# Patient Record
Sex: Male | Born: 1945 | Race: White | Hispanic: No | Marital: Married | Smoking: Current some day smoker
Health system: Southern US, Community
[De-identification: ages and names within clinical notes are randomized; demographics above are authoritative.]

## PROBLEM LIST (undated history)

## (undated) DIAGNOSIS — R519 Headache, unspecified: Secondary | ICD-10-CM

## (undated) DIAGNOSIS — N3281 Overactive bladder: Secondary | ICD-10-CM

## (undated) DIAGNOSIS — F419 Anxiety disorder, unspecified: Secondary | ICD-10-CM

## (undated) DIAGNOSIS — K8020 Calculus of gallbladder without cholecystitis without obstruction: Secondary | ICD-10-CM

## (undated) DIAGNOSIS — J189 Pneumonia, unspecified organism: Secondary | ICD-10-CM

## (undated) DIAGNOSIS — IMO0001 Reserved for inherently not codable concepts without codable children: Secondary | ICD-10-CM

## (undated) DIAGNOSIS — I10 Essential (primary) hypertension: Secondary | ICD-10-CM

## (undated) DIAGNOSIS — G4733 Obstructive sleep apnea (adult) (pediatric): Secondary | ICD-10-CM

## (undated) DIAGNOSIS — T8859XA Other complications of anesthesia, initial encounter: Secondary | ICD-10-CM

## (undated) DIAGNOSIS — F32A Depression, unspecified: Secondary | ICD-10-CM

## (undated) DIAGNOSIS — M1990 Unspecified osteoarthritis, unspecified site: Secondary | ICD-10-CM

## (undated) DIAGNOSIS — E782 Mixed hyperlipidemia: Secondary | ICD-10-CM

## (undated) DIAGNOSIS — N289 Disorder of kidney and ureter, unspecified: Secondary | ICD-10-CM

## (undated) DIAGNOSIS — F329 Major depressive disorder, single episode, unspecified: Secondary | ICD-10-CM

## (undated) HISTORY — DX: Obstructive sleep apnea (adult) (pediatric): G47.33

## (undated) HISTORY — DX: Disorder of kidney and ureter, unspecified: N28.9

## (undated) HISTORY — DX: Overactive bladder: N32.81

## (undated) HISTORY — PX: HAND SURGERY: SHX662

## (undated) HISTORY — DX: Anxiety disorder, unspecified: F41.9

## (undated) HISTORY — DX: Essential (primary) hypertension: I10

## (undated) HISTORY — DX: Calculus of gallbladder without cholecystitis without obstruction: K80.20

## (undated) HISTORY — DX: Depression, unspecified: F32.A

## (undated) HISTORY — DX: Major depressive disorder, single episode, unspecified: F32.9

## (undated) HISTORY — PX: NOSE SURGERY: SHX723

## (undated) HISTORY — DX: Reserved for inherently not codable concepts without codable children: IMO0001

## (undated) HISTORY — PX: HERNIA REPAIR: SHX51

## (undated) HISTORY — DX: Mixed hyperlipidemia: E78.2

---
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
## 1968-07-25 DIAGNOSIS — H9190 Unspecified hearing loss, unspecified ear: Secondary | ICD-10-CM | POA: Insufficient documentation

## 1983-07-26 DIAGNOSIS — K3189 Other diseases of stomach and duodenum: Secondary | ICD-10-CM | POA: Insufficient documentation

## 1991-07-26 DIAGNOSIS — K5730 Diverticulosis of large intestine without perforation or abscess without bleeding: Secondary | ICD-10-CM | POA: Insufficient documentation

## 2000-07-25 DIAGNOSIS — Q619 Cystic kidney disease, unspecified: Secondary | ICD-10-CM | POA: Insufficient documentation

## 2004-08-23 ENCOUNTER — Ambulatory Visit: Payer: Self-pay | Admitting: Family Medicine

## 2005-07-11 ENCOUNTER — Ambulatory Visit: Payer: Self-pay | Admitting: Gastroenterology

## 2006-01-02 ENCOUNTER — Other Ambulatory Visit: Payer: Self-pay

## 2006-01-02 ENCOUNTER — Inpatient Hospital Stay: Payer: Self-pay | Admitting: Internal Medicine

## 2006-02-20 ENCOUNTER — Ambulatory Visit: Payer: Self-pay | Admitting: Cardiology

## 2006-05-03 ENCOUNTER — Ambulatory Visit: Payer: Self-pay | Admitting: Internal Medicine

## 2008-08-29 ENCOUNTER — Encounter: Admission: RE | Admit: 2008-08-29 | Discharge: 2008-08-29 | Payer: Self-pay | Admitting: Sports Medicine

## 2009-01-07 ENCOUNTER — Ambulatory Visit: Payer: Self-pay | Admitting: Gastroenterology

## 2010-05-18 ENCOUNTER — Ambulatory Visit: Payer: Self-pay

## 2010-06-28 ENCOUNTER — Ambulatory Visit: Payer: Self-pay | Admitting: Unknown Physician Specialty

## 2010-07-08 ENCOUNTER — Inpatient Hospital Stay: Payer: Self-pay | Admitting: Unknown Physician Specialty

## 2011-01-21 ENCOUNTER — Ambulatory Visit: Payer: Self-pay

## 2011-10-06 DIAGNOSIS — R5381 Other malaise: Secondary | ICD-10-CM | POA: Diagnosis not present

## 2011-10-06 DIAGNOSIS — R0602 Shortness of breath: Secondary | ICD-10-CM | POA: Diagnosis not present

## 2011-10-06 DIAGNOSIS — I739 Peripheral vascular disease, unspecified: Secondary | ICD-10-CM | POA: Diagnosis not present

## 2011-10-06 DIAGNOSIS — R5383 Other fatigue: Secondary | ICD-10-CM | POA: Diagnosis not present

## 2011-10-06 DIAGNOSIS — Q762 Congenital spondylolisthesis: Secondary | ICD-10-CM | POA: Diagnosis not present

## 2011-10-06 DIAGNOSIS — M159 Polyosteoarthritis, unspecified: Secondary | ICD-10-CM | POA: Diagnosis not present

## 2011-10-06 DIAGNOSIS — I10 Essential (primary) hypertension: Secondary | ICD-10-CM | POA: Diagnosis not present

## 2011-10-06 DIAGNOSIS — R079 Chest pain, unspecified: Secondary | ICD-10-CM | POA: Diagnosis not present

## 2011-11-10 DIAGNOSIS — R9439 Abnormal result of other cardiovascular function study: Secondary | ICD-10-CM | POA: Diagnosis not present

## 2011-11-10 DIAGNOSIS — M159 Polyosteoarthritis, unspecified: Secondary | ICD-10-CM | POA: Diagnosis not present

## 2011-11-10 DIAGNOSIS — I10 Essential (primary) hypertension: Secondary | ICD-10-CM | POA: Diagnosis not present

## 2011-11-10 DIAGNOSIS — J309 Allergic rhinitis, unspecified: Secondary | ICD-10-CM | POA: Diagnosis not present

## 2011-11-10 DIAGNOSIS — I359 Nonrheumatic aortic valve disorder, unspecified: Secondary | ICD-10-CM | POA: Diagnosis not present

## 2011-11-10 DIAGNOSIS — F411 Generalized anxiety disorder: Secondary | ICD-10-CM | POA: Diagnosis not present

## 2011-11-10 DIAGNOSIS — J019 Acute sinusitis, unspecified: Secondary | ICD-10-CM | POA: Diagnosis not present

## 2011-11-23 DIAGNOSIS — N289 Disorder of kidney and ureter, unspecified: Secondary | ICD-10-CM

## 2011-11-23 HISTORY — DX: Disorder of kidney and ureter, unspecified: N28.9

## 2011-12-17 ENCOUNTER — Inpatient Hospital Stay: Payer: Self-pay | Admitting: Student

## 2011-12-17 DIAGNOSIS — M1990 Unspecified osteoarthritis, unspecified site: Secondary | ICD-10-CM | POA: Diagnosis present

## 2011-12-17 DIAGNOSIS — E876 Hypokalemia: Secondary | ICD-10-CM | POA: Diagnosis not present

## 2011-12-17 DIAGNOSIS — I129 Hypertensive chronic kidney disease with stage 1 through stage 4 chronic kidney disease, or unspecified chronic kidney disease: Secondary | ICD-10-CM | POA: Diagnosis not present

## 2011-12-17 DIAGNOSIS — I509 Heart failure, unspecified: Secondary | ICD-10-CM | POA: Diagnosis not present

## 2011-12-17 DIAGNOSIS — N200 Calculus of kidney: Secondary | ICD-10-CM | POA: Diagnosis not present

## 2011-12-17 DIAGNOSIS — A419 Sepsis, unspecified organism: Secondary | ICD-10-CM | POA: Diagnosis present

## 2011-12-17 DIAGNOSIS — Z9889 Other specified postprocedural states: Secondary | ICD-10-CM | POA: Diagnosis not present

## 2011-12-17 DIAGNOSIS — R51 Headache: Secondary | ICD-10-CM | POA: Diagnosis not present

## 2011-12-17 DIAGNOSIS — R079 Chest pain, unspecified: Secondary | ICD-10-CM | POA: Diagnosis not present

## 2011-12-17 DIAGNOSIS — N189 Chronic kidney disease, unspecified: Secondary | ICD-10-CM | POA: Diagnosis present

## 2011-12-17 DIAGNOSIS — Z823 Family history of stroke: Secondary | ICD-10-CM | POA: Diagnosis not present

## 2011-12-17 DIAGNOSIS — N10 Acute tubulo-interstitial nephritis: Secondary | ICD-10-CM | POA: Diagnosis not present

## 2011-12-17 DIAGNOSIS — E860 Dehydration: Secondary | ICD-10-CM | POA: Diagnosis not present

## 2011-12-17 DIAGNOSIS — Z8249 Family history of ischemic heart disease and other diseases of the circulatory system: Secondary | ICD-10-CM | POA: Diagnosis not present

## 2011-12-17 DIAGNOSIS — E871 Hypo-osmolality and hyponatremia: Secondary | ICD-10-CM | POA: Diagnosis not present

## 2011-12-17 DIAGNOSIS — R3915 Urgency of urination: Secondary | ICD-10-CM | POA: Diagnosis not present

## 2011-12-17 DIAGNOSIS — R110 Nausea: Secondary | ICD-10-CM | POA: Diagnosis not present

## 2011-12-17 DIAGNOSIS — N179 Acute kidney failure, unspecified: Secondary | ICD-10-CM | POA: Diagnosis not present

## 2011-12-17 DIAGNOSIS — Z880 Allergy status to penicillin: Secondary | ICD-10-CM | POA: Diagnosis not present

## 2011-12-17 DIAGNOSIS — E785 Hyperlipidemia, unspecified: Secondary | ICD-10-CM | POA: Diagnosis present

## 2011-12-17 DIAGNOSIS — Z7982 Long term (current) use of aspirin: Secondary | ICD-10-CM | POA: Diagnosis not present

## 2011-12-17 DIAGNOSIS — Z833 Family history of diabetes mellitus: Secondary | ICD-10-CM | POA: Diagnosis not present

## 2011-12-17 DIAGNOSIS — N400 Enlarged prostate without lower urinary tract symptoms: Secondary | ICD-10-CM | POA: Diagnosis present

## 2011-12-17 DIAGNOSIS — N289 Disorder of kidney and ureter, unspecified: Secondary | ICD-10-CM | POA: Diagnosis not present

## 2011-12-17 DIAGNOSIS — A4151 Sepsis due to Escherichia coli [E. coli]: Secondary | ICD-10-CM | POA: Diagnosis not present

## 2011-12-17 DIAGNOSIS — R509 Fever, unspecified: Secondary | ICD-10-CM | POA: Diagnosis not present

## 2011-12-17 DIAGNOSIS — Z87891 Personal history of nicotine dependence: Secondary | ICD-10-CM | POA: Diagnosis not present

## 2011-12-17 DIAGNOSIS — Z79899 Other long term (current) drug therapy: Secondary | ICD-10-CM | POA: Diagnosis not present

## 2011-12-17 DIAGNOSIS — R6889 Other general symptoms and signs: Secondary | ICD-10-CM | POA: Diagnosis not present

## 2011-12-17 DIAGNOSIS — G4733 Obstructive sleep apnea (adult) (pediatric): Secondary | ICD-10-CM | POA: Diagnosis present

## 2011-12-17 LAB — COMPREHENSIVE METABOLIC PANEL
Albumin: 2.6 g/dL — ABNORMAL LOW (ref 3.4–5.0)
Calcium, Total: 8 mg/dL — ABNORMAL LOW (ref 8.5–10.1)
Chloride: 97 mmol/L — ABNORMAL LOW (ref 98–107)
Creatinine: 1.65 mg/dL — ABNORMAL HIGH (ref 0.60–1.30)
EGFR (African American): 49 — ABNORMAL LOW
SGOT(AST): 33 U/L (ref 15–37)
SGPT (ALT): 29 U/L
Sodium: 134 mmol/L — ABNORMAL LOW (ref 136–145)
Total Protein: 6.9 g/dL (ref 6.4–8.2)

## 2011-12-17 LAB — URINALYSIS, COMPLETE
Ketone: NEGATIVE
Nitrite: POSITIVE
Ph: 5 (ref 4.5–8.0)
RBC,UR: 9 /HPF (ref 0–5)

## 2011-12-17 LAB — CBC
HCT: 41.1 % (ref 40.0–52.0)
MCH: 27 pg (ref 26.0–34.0)
MCHC: 31.8 g/dL — ABNORMAL LOW (ref 32.0–36.0)
RDW: 14 % (ref 11.5–14.5)

## 2011-12-18 LAB — CBC WITH DIFFERENTIAL/PLATELET
Basophil #: 0 10*3/uL (ref 0.0–0.1)
Basophil %: 0.3 %
Eosinophil #: 0 10*3/uL (ref 0.0–0.7)
Eosinophil %: 0.1 %
HGB: 12.9 g/dL — ABNORMAL LOW (ref 13.0–18.0)
Lymphocyte #: 0.5 10*3/uL — ABNORMAL LOW (ref 1.0–3.6)
Lymphocyte %: 5 %
MCH: 28.2 pg (ref 26.0–34.0)
MCHC: 33.6 g/dL (ref 32.0–36.0)
Neutrophil #: 9.1 10*3/uL — ABNORMAL HIGH (ref 1.4–6.5)
Neutrophil %: 85.4 %
Platelet: 157 10*3/uL (ref 150–440)
RBC: 4.58 10*6/uL (ref 4.40–5.90)
RDW: 14 % (ref 11.5–14.5)

## 2011-12-18 LAB — BASIC METABOLIC PANEL
Anion Gap: 10 (ref 7–16)
BUN: 30 mg/dL — ABNORMAL HIGH (ref 7–18)
Calcium, Total: 7.7 mg/dL — ABNORMAL LOW (ref 8.5–10.1)
Sodium: 138 mmol/L (ref 136–145)

## 2011-12-18 LAB — HEMOGLOBIN A1C: Hemoglobin A1C: 6 % (ref 4.2–6.3)

## 2011-12-19 LAB — BASIC METABOLIC PANEL
BUN: 23 mg/dL — ABNORMAL HIGH (ref 7–18)
Chloride: 103 mmol/L (ref 98–107)
Creatinine: 1.38 mg/dL — ABNORMAL HIGH (ref 0.60–1.30)
EGFR (African American): >60
Osmolality: 282 (ref 275–301)
Potassium: 3.3 mmol/L — ABNORMAL LOW (ref 3.5–5.1)
Sodium: 139 mmol/L (ref 136–145)

## 2011-12-19 LAB — URINE CULTURE

## 2011-12-20 LAB — BASIC METABOLIC PANEL
Chloride: 106 mmol/L (ref 98–107)
EGFR (African American): >60
Glucose: 103 mg/dL — ABNORMAL HIGH (ref 65–99)
Potassium: 3.6 mmol/L (ref 3.5–5.1)
Sodium: 141 mmol/L (ref 136–145)

## 2011-12-20 LAB — CULTURE, BLOOD (SINGLE)

## 2011-12-24 LAB — CULTURE, BLOOD (SINGLE)

## 2011-12-27 DIAGNOSIS — N12 Tubulo-interstitial nephritis, not specified as acute or chronic: Secondary | ICD-10-CM | POA: Diagnosis not present

## 2011-12-27 DIAGNOSIS — R319 Hematuria, unspecified: Secondary | ICD-10-CM | POA: Diagnosis not present

## 2011-12-27 DIAGNOSIS — R5381 Other malaise: Secondary | ICD-10-CM | POA: Diagnosis not present

## 2011-12-27 DIAGNOSIS — I10 Essential (primary) hypertension: Secondary | ICD-10-CM | POA: Diagnosis not present

## 2011-12-27 DIAGNOSIS — R5383 Other fatigue: Secondary | ICD-10-CM | POA: Diagnosis not present

## 2011-12-27 DIAGNOSIS — I509 Heart failure, unspecified: Secondary | ICD-10-CM | POA: Diagnosis not present

## 2011-12-27 DIAGNOSIS — R609 Edema, unspecified: Secondary | ICD-10-CM | POA: Diagnosis not present

## 2012-01-02 DIAGNOSIS — IMO0002 Reserved for concepts with insufficient information to code with codable children: Secondary | ICD-10-CM | POA: Diagnosis not present

## 2012-01-02 DIAGNOSIS — Q762 Congenital spondylolisthesis: Secondary | ICD-10-CM | POA: Diagnosis not present

## 2012-01-02 DIAGNOSIS — I10 Essential (primary) hypertension: Secondary | ICD-10-CM | POA: Diagnosis not present

## 2012-01-02 DIAGNOSIS — R319 Hematuria, unspecified: Secondary | ICD-10-CM | POA: Diagnosis not present

## 2012-01-02 DIAGNOSIS — R5381 Other malaise: Secondary | ICD-10-CM | POA: Diagnosis not present

## 2012-01-02 DIAGNOSIS — N12 Tubulo-interstitial nephritis, not specified as acute or chronic: Secondary | ICD-10-CM | POA: Diagnosis not present

## 2012-02-10 DIAGNOSIS — R609 Edema, unspecified: Secondary | ICD-10-CM | POA: Diagnosis not present

## 2012-02-10 DIAGNOSIS — I10 Essential (primary) hypertension: Secondary | ICD-10-CM | POA: Diagnosis not present

## 2012-02-10 DIAGNOSIS — R0609 Other forms of dyspnea: Secondary | ICD-10-CM | POA: Diagnosis not present

## 2012-02-10 DIAGNOSIS — R0989 Other specified symptoms and signs involving the circulatory and respiratory systems: Secondary | ICD-10-CM | POA: Diagnosis not present

## 2012-02-10 DIAGNOSIS — I5032 Chronic diastolic (congestive) heart failure: Secondary | ICD-10-CM | POA: Diagnosis not present

## 2012-03-08 DIAGNOSIS — R350 Frequency of micturition: Secondary | ICD-10-CM | POA: Diagnosis not present

## 2012-03-08 DIAGNOSIS — H538 Other visual disturbances: Secondary | ICD-10-CM | POA: Diagnosis not present

## 2012-03-08 DIAGNOSIS — G4710 Hypersomnia, unspecified: Secondary | ICD-10-CM | POA: Diagnosis not present

## 2012-03-08 DIAGNOSIS — I10 Essential (primary) hypertension: Secondary | ICD-10-CM | POA: Diagnosis not present

## 2012-03-08 DIAGNOSIS — R5381 Other malaise: Secondary | ICD-10-CM | POA: Diagnosis not present

## 2012-05-14 DIAGNOSIS — G4710 Hypersomnia, unspecified: Secondary | ICD-10-CM | POA: Diagnosis not present

## 2012-05-14 DIAGNOSIS — G4730 Sleep apnea, unspecified: Secondary | ICD-10-CM | POA: Diagnosis not present

## 2012-05-31 DIAGNOSIS — I10 Essential (primary) hypertension: Secondary | ICD-10-CM | POA: Diagnosis not present

## 2012-05-31 DIAGNOSIS — E782 Mixed hyperlipidemia: Secondary | ICD-10-CM | POA: Diagnosis not present

## 2012-05-31 DIAGNOSIS — R0609 Other forms of dyspnea: Secondary | ICD-10-CM | POA: Diagnosis not present

## 2012-07-12 DIAGNOSIS — M25579 Pain in unspecified ankle and joints of unspecified foot: Secondary | ICD-10-CM | POA: Diagnosis not present

## 2012-07-12 DIAGNOSIS — H9190 Unspecified hearing loss, unspecified ear: Secondary | ICD-10-CM | POA: Diagnosis not present

## 2012-07-12 DIAGNOSIS — R5381 Other malaise: Secondary | ICD-10-CM | POA: Diagnosis not present

## 2012-07-12 DIAGNOSIS — G4710 Hypersomnia, unspecified: Secondary | ICD-10-CM | POA: Diagnosis not present

## 2012-07-12 DIAGNOSIS — J019 Acute sinusitis, unspecified: Secondary | ICD-10-CM | POA: Diagnosis not present

## 2012-07-12 DIAGNOSIS — I10 Essential (primary) hypertension: Secondary | ICD-10-CM | POA: Diagnosis not present

## 2012-07-12 DIAGNOSIS — M5137 Other intervertebral disc degeneration, lumbosacral region: Secondary | ICD-10-CM | POA: Diagnosis not present

## 2012-08-17 DIAGNOSIS — T169XXA Foreign body in ear, unspecified ear, initial encounter: Secondary | ICD-10-CM | POA: Diagnosis not present

## 2012-08-17 DIAGNOSIS — H903 Sensorineural hearing loss, bilateral: Secondary | ICD-10-CM | POA: Diagnosis not present

## 2012-08-17 DIAGNOSIS — H905 Unspecified sensorineural hearing loss: Secondary | ICD-10-CM | POA: Diagnosis not present

## 2012-08-17 DIAGNOSIS — H6120 Impacted cerumen, unspecified ear: Secondary | ICD-10-CM | POA: Diagnosis not present

## 2012-10-26 ENCOUNTER — Ambulatory Visit: Payer: Self-pay

## 2012-10-26 DIAGNOSIS — M19079 Primary osteoarthritis, unspecified ankle and foot: Secondary | ICD-10-CM | POA: Diagnosis not present

## 2012-10-26 DIAGNOSIS — M25579 Pain in unspecified ankle and joints of unspecified foot: Secondary | ICD-10-CM | POA: Diagnosis not present

## 2012-11-08 DIAGNOSIS — R319 Hematuria, unspecified: Secondary | ICD-10-CM | POA: Diagnosis not present

## 2012-11-08 DIAGNOSIS — Z125 Encounter for screening for malignant neoplasm of prostate: Secondary | ICD-10-CM | POA: Diagnosis not present

## 2012-11-08 DIAGNOSIS — I10 Essential (primary) hypertension: Secondary | ICD-10-CM | POA: Diagnosis not present

## 2012-11-08 DIAGNOSIS — E782 Mixed hyperlipidemia: Secondary | ICD-10-CM | POA: Diagnosis not present

## 2012-11-08 DIAGNOSIS — Z0000 Encounter for general adult medical examination without abnormal findings: Secondary | ICD-10-CM | POA: Diagnosis not present

## 2012-11-08 DIAGNOSIS — R635 Abnormal weight gain: Secondary | ICD-10-CM | POA: Diagnosis not present

## 2012-11-08 DIAGNOSIS — M47817 Spondylosis without myelopathy or radiculopathy, lumbosacral region: Secondary | ICD-10-CM | POA: Diagnosis not present

## 2012-11-08 DIAGNOSIS — E785 Hyperlipidemia, unspecified: Secondary | ICD-10-CM | POA: Diagnosis not present

## 2012-11-08 DIAGNOSIS — E669 Obesity, unspecified: Secondary | ICD-10-CM | POA: Diagnosis not present

## 2012-11-08 DIAGNOSIS — R5381 Other malaise: Secondary | ICD-10-CM | POA: Diagnosis not present

## 2012-11-08 DIAGNOSIS — M159 Polyosteoarthritis, unspecified: Secondary | ICD-10-CM | POA: Diagnosis not present

## 2012-11-08 DIAGNOSIS — R300 Dysuria: Secondary | ICD-10-CM | POA: Diagnosis not present

## 2012-11-15 DIAGNOSIS — M25569 Pain in unspecified knee: Secondary | ICD-10-CM | POA: Diagnosis not present

## 2012-11-15 DIAGNOSIS — M25579 Pain in unspecified ankle and joints of unspecified foot: Secondary | ICD-10-CM | POA: Diagnosis not present

## 2012-12-14 DIAGNOSIS — R0789 Other chest pain: Secondary | ICD-10-CM | POA: Diagnosis not present

## 2012-12-14 DIAGNOSIS — R0989 Other specified symptoms and signs involving the circulatory and respiratory systems: Secondary | ICD-10-CM | POA: Diagnosis not present

## 2012-12-14 DIAGNOSIS — I10 Essential (primary) hypertension: Secondary | ICD-10-CM | POA: Diagnosis not present

## 2012-12-14 DIAGNOSIS — E782 Mixed hyperlipidemia: Secondary | ICD-10-CM | POA: Diagnosis not present

## 2012-12-14 DIAGNOSIS — R0609 Other forms of dyspnea: Secondary | ICD-10-CM | POA: Diagnosis not present

## 2012-12-20 ENCOUNTER — Ambulatory Visit: Payer: Self-pay | Admitting: Hematology and Oncology

## 2012-12-20 DIAGNOSIS — E782 Mixed hyperlipidemia: Secondary | ICD-10-CM | POA: Diagnosis not present

## 2012-12-20 DIAGNOSIS — I10 Essential (primary) hypertension: Secondary | ICD-10-CM | POA: Diagnosis not present

## 2012-12-20 DIAGNOSIS — E669 Obesity, unspecified: Secondary | ICD-10-CM | POA: Diagnosis not present

## 2012-12-20 DIAGNOSIS — R319 Hematuria, unspecified: Secondary | ICD-10-CM | POA: Diagnosis not present

## 2012-12-20 DIAGNOSIS — F411 Generalized anxiety disorder: Secondary | ICD-10-CM | POA: Diagnosis not present

## 2012-12-20 DIAGNOSIS — R350 Frequency of micturition: Secondary | ICD-10-CM | POA: Diagnosis not present

## 2012-12-20 DIAGNOSIS — R809 Proteinuria, unspecified: Secondary | ICD-10-CM | POA: Diagnosis not present

## 2012-12-20 DIAGNOSIS — R161 Splenomegaly, not elsewhere classified: Secondary | ICD-10-CM | POA: Diagnosis not present

## 2012-12-27 ENCOUNTER — Ambulatory Visit: Payer: Self-pay | Admitting: Hematology and Oncology

## 2012-12-27 DIAGNOSIS — R109 Unspecified abdominal pain: Secondary | ICD-10-CM | POA: Diagnosis not present

## 2012-12-27 DIAGNOSIS — R52 Pain, unspecified: Secondary | ICD-10-CM | POA: Diagnosis not present

## 2012-12-27 DIAGNOSIS — E785 Hyperlipidemia, unspecified: Secondary | ICD-10-CM | POA: Diagnosis not present

## 2012-12-27 DIAGNOSIS — I10 Essential (primary) hypertension: Secondary | ICD-10-CM | POA: Diagnosis not present

## 2012-12-27 DIAGNOSIS — Z79899 Other long term (current) drug therapy: Secondary | ICD-10-CM | POA: Diagnosis not present

## 2012-12-27 DIAGNOSIS — Z7982 Long term (current) use of aspirin: Secondary | ICD-10-CM | POA: Diagnosis not present

## 2012-12-27 DIAGNOSIS — N1330 Unspecified hydronephrosis: Secondary | ICD-10-CM | POA: Diagnosis not present

## 2012-12-27 DIAGNOSIS — G4730 Sleep apnea, unspecified: Secondary | ICD-10-CM | POA: Diagnosis not present

## 2012-12-27 DIAGNOSIS — R161 Splenomegaly, not elsewhere classified: Secondary | ICD-10-CM | POA: Diagnosis not present

## 2013-01-10 DIAGNOSIS — E291 Testicular hypofunction: Secondary | ICD-10-CM | POA: Diagnosis not present

## 2013-01-10 DIAGNOSIS — N509 Disorder of male genital organs, unspecified: Secondary | ICD-10-CM | POA: Diagnosis not present

## 2013-01-10 DIAGNOSIS — R310 Gross hematuria: Secondary | ICD-10-CM | POA: Diagnosis not present

## 2013-01-14 DIAGNOSIS — I10 Essential (primary) hypertension: Secondary | ICD-10-CM | POA: Diagnosis not present

## 2013-01-14 DIAGNOSIS — R0789 Other chest pain: Secondary | ICD-10-CM | POA: Diagnosis not present

## 2013-01-14 DIAGNOSIS — R0989 Other specified symptoms and signs involving the circulatory and respiratory systems: Secondary | ICD-10-CM | POA: Diagnosis not present

## 2013-01-14 DIAGNOSIS — R0609 Other forms of dyspnea: Secondary | ICD-10-CM | POA: Diagnosis not present

## 2013-01-14 DIAGNOSIS — E782 Mixed hyperlipidemia: Secondary | ICD-10-CM | POA: Diagnosis not present

## 2013-01-22 ENCOUNTER — Ambulatory Visit: Payer: Self-pay | Admitting: Hematology and Oncology

## 2013-02-08 ENCOUNTER — Ambulatory Visit: Payer: Self-pay | Admitting: Urology

## 2013-02-08 DIAGNOSIS — N509 Disorder of male genital organs, unspecified: Secondary | ICD-10-CM | POA: Diagnosis not present

## 2013-02-08 DIAGNOSIS — N200 Calculus of kidney: Secondary | ICD-10-CM | POA: Diagnosis not present

## 2013-02-08 DIAGNOSIS — K8020 Calculus of gallbladder without cholecystitis without obstruction: Secondary | ICD-10-CM | POA: Diagnosis not present

## 2013-02-08 DIAGNOSIS — N433 Hydrocele, unspecified: Secondary | ICD-10-CM | POA: Diagnosis not present

## 2013-02-08 DIAGNOSIS — R310 Gross hematuria: Secondary | ICD-10-CM | POA: Diagnosis not present

## 2013-02-08 DIAGNOSIS — R319 Hematuria, unspecified: Secondary | ICD-10-CM | POA: Diagnosis not present

## 2013-02-11 DIAGNOSIS — N433 Hydrocele, unspecified: Secondary | ICD-10-CM | POA: Diagnosis not present

## 2013-02-11 DIAGNOSIS — K8020 Calculus of gallbladder without cholecystitis without obstruction: Secondary | ICD-10-CM | POA: Diagnosis not present

## 2013-02-11 DIAGNOSIS — R310 Gross hematuria: Secondary | ICD-10-CM | POA: Diagnosis not present

## 2013-02-11 DIAGNOSIS — E291 Testicular hypofunction: Secondary | ICD-10-CM | POA: Diagnosis not present

## 2013-02-11 DIAGNOSIS — N200 Calculus of kidney: Secondary | ICD-10-CM | POA: Diagnosis not present

## 2013-02-13 ENCOUNTER — Encounter: Payer: Self-pay | Admitting: *Deleted

## 2013-03-05 ENCOUNTER — Encounter: Payer: Self-pay | Admitting: General Surgery

## 2013-03-05 ENCOUNTER — Ambulatory Visit (INDEPENDENT_AMBULATORY_CARE_PROVIDER_SITE_OTHER): Payer: BC Managed Care – PPO | Admitting: General Surgery

## 2013-03-05 VITALS — BP 160/80 | HR 80 | Resp 18 | Ht 70.0 in | Wt 338.0 lb

## 2013-03-05 DIAGNOSIS — K8020 Calculus of gallbladder without cholecystitis without obstruction: Secondary | ICD-10-CM

## 2013-03-05 HISTORY — DX: Calculus of gallbladder without cholecystitis without obstruction: K80.20

## 2013-03-05 NOTE — Patient Instructions (Addendum)
Patient to return as needed. Patient advised of symptoms of gallbladder problems and when it's necessary to be seen. Information booklet given.    Cholelithiasis Cholelithiasis (also called gallstones) is a form of gallbladder disease where gallstones form in your gallbladder. The gallbladder is a non-essential organ that stores bile made in the liver, which helps digest fats. Gallstones begin as small crystals and slowly grow into stones. Gallstone pain occurs when the gallbladder spasms, and a gallstone is blocking the duct. Pain can also occur when a stone passes out of the duct.  Women are more likely to develop gallstones than men. Other factors that increase the risk of gallbladder disease are:  Having multiple pregnancies. Physicians sometimes advise removing diseased gallbladders before future pregnancies.  Obesity.  Diets heavy in fried foods and fat.  Increasing age (older than 69).  Prolonged use of medications containing male hormones.  Diabetes mellitus.  Rapid weight loss.  Family history of gallstones (heredity). SYMPTOMS  Feeling sick to your stomach (nauseous).  Abdominal pain.  Yellowing of the skin (jaundice).  Sudden pain. It may persist from several minutes to several hours.  Worsening pain with deep breathing or when jarred.  Fever.  Tenderness to the touch. In some cases, when gallstones do not move into the bile duct, people have no pain or symptoms. These are called "silent" gallstones. TREATMENT In severe cases, emergency surgery may be required. HOME CARE INSTRUCTIONS   Only take over-the-counter or prescription medicines for pain, discomfort, or fever as directed by your caregiver.  Follow a low-fat diet until seen again. Fat causes the gallbladder to contract, which can result in pain.  Follow up as instructed. Attacks are almost always recurrent and surgery is usually required for permanent treatment. SEEK IMMEDIATE MEDICAL CARE IF:    Your pain increases and is not controlled by medications.  You have an oral temperature above 102 F (38.9 C), not controlled by medication.  You develop nausea and vomiting. MAKE SURE YOU:   Understand these instructions.  Will watch your condition.  Will get help right away if you are not doing well or get worse. Document Released: 07/07/2005 Document Revised: 10/03/2011 Document Reviewed: 09/09/2010 [facility] Patient Information 2014 [place], [place].

## 2013-03-05 NOTE — Progress Notes (Signed)
Patient ID: Jimmy Fuse., male   DOB: May 22, 1946, 67 y.o.   MRN: [id number]  Chief Complaint  Patient presents with  . Other    gallstones    HPI Jimmy A Harvest Stanco. is a 67 y.o. male here today for an evaluation for his gallstones. Patient had an abdomen/pelvis CT scan done in July 2014.Patient states he has been having abdomen pain for about an year. Located lower abdomen and/or mid abdomen. The pain comes and goes lasting up to half an hour. No associated GI symptoms.  HPI  Past Medical History  Diagnosis Date  . Kidney problem   May  2013    Past Surgical History  Procedure Laterality Date  . Hernia repair    . Nose surgery    . Hand surgery Bilateral     History reviewed. No pertinent family history.  Social History History  Substance Use Topics  . Smoking status: Never Smoker   . Smokeless tobacco: Current User  . Alcohol Use: No    Allergies  Allergen Reactions  . Penicillins Rash    Current Outpatient Prescriptions  Medication Sig Dispense Refill  . amLODipine (NORVASC) 10 MG tablet Take 10 mg by mouth daily.      Marland Kitchen aspirin 325 MG tablet Take 325 mg by mouth daily.      . cholecalciferol (VITAMIN D) 400 UNITS TABS tablet Take 400 Units by mouth daily.      . Cyanocobalamin (B-12) 2500 MCG TABS Take 1 tablet by mouth daily.      Marland Kitchen etodolac (LODINE) 300 MG capsule Take 300 mg by mouth every 8 (eight) hours.      Marland Kitchen glucosamine-chondroitin 500-400 MG tablet Take 1 tablet by mouth 2 (two) times daily.      Marland Kitchen glucose blood test strip 1 each by Other route as needed for other. Use as instructed      . Multiple Vitamins-Minerals (MULTIVITAMIN WITH MINERALS) tablet Take 1 tablet by mouth daily.      Marland Kitchen pyridoxine (B-6) 100 MG tablet Take 100 mg by mouth daily.      . sertraline (ZOLOFT) 50 MG tablet Take 50 mg by mouth daily.       No current facility-administered medications for this visit.    Review of Systems Review of Systems  Constitutional: Negative.    Respiratory: Positive for shortness of breath.   Cardiovascular: Negative.   Gastrointestinal: Positive for nausea and abdominal pain. Negative for vomiting, diarrhea, constipation, blood in stool, abdominal distention, anal bleeding and rectal pain.    Blood pressure 160/80, pulse 80, resp. rate 18, height 5\' 10"  (1.778 m), weight 338 lb (153.316 kg).  Physical Exam Physical Exam  Constitutional: He is oriented to person, place, and time. He appears well-developed and well-nourished.  Eyes: Conjunctivae are normal. No scleral icterus.  Neck: No mass and no thyromegaly present.  Cardiovascular: Normal rate, regular rhythm and normal heart sounds.   No murmur heard. Pulmonary/Chest: Effort normal and breath sounds normal.  Abdominal: Soft. Normal appearance and bowel sounds are normal. There is no hepatosplenomegaly. There is tenderness (mild non focal tenderness of right side of abdomen. Not reproducable in same location.). There is no rigidity, no guarding and negative Murphy's sign. No hernia.  Neurological: He is alert and oriented to person, place, and time.  Skin: Skin is warm and dry.    Data Reviewed CT Scan showing gallstones. No evidence of acute cholecystitis.   Assessment    Cholelithiasis with  no related symptoms at present.    Plan    Patient advised and information given related to potential problems of gallstones.         Jimmy Norton 03/06/2013, 2:20 PM

## 2013-03-06 ENCOUNTER — Encounter: Payer: Self-pay | Admitting: General Surgery

## 2013-03-22 DIAGNOSIS — R809 Proteinuria, unspecified: Secondary | ICD-10-CM | POA: Diagnosis not present

## 2013-03-22 DIAGNOSIS — I10 Essential (primary) hypertension: Secondary | ICD-10-CM | POA: Diagnosis not present

## 2013-03-22 DIAGNOSIS — E782 Mixed hyperlipidemia: Secondary | ICD-10-CM | POA: Diagnosis not present

## 2013-03-22 DIAGNOSIS — G4710 Hypersomnia, unspecified: Secondary | ICD-10-CM | POA: Diagnosis not present

## 2013-03-22 DIAGNOSIS — J309 Allergic rhinitis, unspecified: Secondary | ICD-10-CM | POA: Diagnosis not present

## 2013-03-22 DIAGNOSIS — R5381 Other malaise: Secondary | ICD-10-CM | POA: Diagnosis not present

## 2013-03-22 DIAGNOSIS — M5137 Other intervertebral disc degeneration, lumbosacral region: Secondary | ICD-10-CM | POA: Diagnosis not present

## 2013-03-22 DIAGNOSIS — M159 Polyosteoarthritis, unspecified: Secondary | ICD-10-CM | POA: Diagnosis not present

## 2013-05-08 DIAGNOSIS — N200 Calculus of kidney: Secondary | ICD-10-CM | POA: Diagnosis not present

## 2013-05-08 DIAGNOSIS — G4730 Sleep apnea, unspecified: Secondary | ICD-10-CM | POA: Diagnosis not present

## 2013-05-08 DIAGNOSIS — R310 Gross hematuria: Secondary | ICD-10-CM | POA: Diagnosis not present

## 2013-05-08 DIAGNOSIS — N433 Hydrocele, unspecified: Secondary | ICD-10-CM | POA: Diagnosis not present

## 2013-05-16 DIAGNOSIS — G4710 Hypersomnia, unspecified: Secondary | ICD-10-CM | POA: Diagnosis not present

## 2013-05-16 DIAGNOSIS — G4720 Circadian rhythm sleep disorder, unspecified type: Secondary | ICD-10-CM | POA: Diagnosis not present

## 2013-08-02 DIAGNOSIS — I739 Peripheral vascular disease, unspecified: Secondary | ICD-10-CM | POA: Diagnosis not present

## 2013-08-02 DIAGNOSIS — F329 Major depressive disorder, single episode, unspecified: Secondary | ICD-10-CM | POA: Diagnosis not present

## 2013-08-02 DIAGNOSIS — G4730 Sleep apnea, unspecified: Secondary | ICD-10-CM | POA: Diagnosis not present

## 2013-08-02 DIAGNOSIS — M159 Polyosteoarthritis, unspecified: Secondary | ICD-10-CM | POA: Diagnosis not present

## 2013-08-02 DIAGNOSIS — I10 Essential (primary) hypertension: Secondary | ICD-10-CM | POA: Diagnosis not present

## 2013-08-02 DIAGNOSIS — G4710 Hypersomnia, unspecified: Secondary | ICD-10-CM | POA: Diagnosis not present

## 2013-08-02 DIAGNOSIS — F3289 Other specified depressive episodes: Secondary | ICD-10-CM | POA: Diagnosis not present

## 2013-08-02 DIAGNOSIS — R5381 Other malaise: Secondary | ICD-10-CM | POA: Diagnosis not present

## 2013-08-02 DIAGNOSIS — R5383 Other fatigue: Secondary | ICD-10-CM | POA: Diagnosis not present

## 2013-11-14 DIAGNOSIS — G4720 Circadian rhythm sleep disorder, unspecified type: Secondary | ICD-10-CM | POA: Diagnosis not present

## 2013-11-14 DIAGNOSIS — G4730 Sleep apnea, unspecified: Secondary | ICD-10-CM | POA: Diagnosis not present

## 2013-11-14 DIAGNOSIS — G4710 Hypersomnia, unspecified: Secondary | ICD-10-CM | POA: Diagnosis not present

## 2013-12-04 DIAGNOSIS — R5381 Other malaise: Secondary | ICD-10-CM | POA: Diagnosis not present

## 2013-12-04 DIAGNOSIS — G4710 Hypersomnia, unspecified: Secondary | ICD-10-CM | POA: Diagnosis not present

## 2013-12-04 DIAGNOSIS — F411 Generalized anxiety disorder: Secondary | ICD-10-CM | POA: Diagnosis not present

## 2013-12-04 DIAGNOSIS — F329 Major depressive disorder, single episode, unspecified: Secondary | ICD-10-CM | POA: Diagnosis not present

## 2013-12-04 DIAGNOSIS — I10 Essential (primary) hypertension: Secondary | ICD-10-CM | POA: Diagnosis not present

## 2013-12-04 DIAGNOSIS — F3289 Other specified depressive episodes: Secondary | ICD-10-CM | POA: Diagnosis not present

## 2013-12-04 DIAGNOSIS — G4730 Sleep apnea, unspecified: Secondary | ICD-10-CM | POA: Diagnosis not present

## 2013-12-04 DIAGNOSIS — M159 Polyosteoarthritis, unspecified: Secondary | ICD-10-CM | POA: Diagnosis not present

## 2013-12-04 DIAGNOSIS — R5383 Other fatigue: Secondary | ICD-10-CM | POA: Diagnosis not present

## 2013-12-04 DIAGNOSIS — K589 Irritable bowel syndrome without diarrhea: Secondary | ICD-10-CM | POA: Diagnosis not present

## 2013-12-04 DIAGNOSIS — E782 Mixed hyperlipidemia: Secondary | ICD-10-CM | POA: Diagnosis not present

## 2013-12-05 DIAGNOSIS — J309 Allergic rhinitis, unspecified: Secondary | ICD-10-CM | POA: Diagnosis not present

## 2013-12-05 DIAGNOSIS — G4710 Hypersomnia, unspecified: Secondary | ICD-10-CM | POA: Diagnosis not present

## 2013-12-05 DIAGNOSIS — G4730 Sleep apnea, unspecified: Secondary | ICD-10-CM | POA: Diagnosis not present

## 2013-12-29 ENCOUNTER — Inpatient Hospital Stay: Payer: Self-pay | Admitting: Internal Medicine

## 2013-12-29 DIAGNOSIS — Z6841 Body Mass Index (BMI) 40.0 and over, adult: Secondary | ICD-10-CM | POA: Diagnosis not present

## 2013-12-29 DIAGNOSIS — E7800 Pure hypercholesterolemia, unspecified: Secondary | ICD-10-CM | POA: Diagnosis present

## 2013-12-29 DIAGNOSIS — I10 Essential (primary) hypertension: Secondary | ICD-10-CM | POA: Diagnosis not present

## 2013-12-29 DIAGNOSIS — F329 Major depressive disorder, single episode, unspecified: Secondary | ICD-10-CM | POA: Diagnosis present

## 2013-12-29 DIAGNOSIS — R4182 Altered mental status, unspecified: Secondary | ICD-10-CM | POA: Diagnosis not present

## 2013-12-29 DIAGNOSIS — G4730 Sleep apnea, unspecified: Secondary | ICD-10-CM | POA: Diagnosis not present

## 2013-12-29 DIAGNOSIS — A498 Other bacterial infections of unspecified site: Secondary | ICD-10-CM | POA: Diagnosis present

## 2013-12-29 DIAGNOSIS — M1990 Unspecified osteoarthritis, unspecified site: Secondary | ICD-10-CM | POA: Diagnosis present

## 2013-12-29 DIAGNOSIS — F411 Generalized anxiety disorder: Secondary | ICD-10-CM | POA: Diagnosis present

## 2013-12-29 DIAGNOSIS — N390 Urinary tract infection, site not specified: Secondary | ICD-10-CM | POA: Diagnosis not present

## 2013-12-29 DIAGNOSIS — A419 Sepsis, unspecified organism: Secondary | ICD-10-CM | POA: Diagnosis not present

## 2013-12-29 DIAGNOSIS — F3289 Other specified depressive episodes: Secondary | ICD-10-CM | POA: Diagnosis present

## 2013-12-29 DIAGNOSIS — G4733 Obstructive sleep apnea (adult) (pediatric): Secondary | ICD-10-CM | POA: Diagnosis present

## 2013-12-29 DIAGNOSIS — N200 Calculus of kidney: Secondary | ICD-10-CM | POA: Diagnosis not present

## 2013-12-29 DIAGNOSIS — R161 Splenomegaly, not elsewhere classified: Secondary | ICD-10-CM | POA: Diagnosis present

## 2013-12-29 LAB — COMPREHENSIVE METABOLIC PANEL
ALBUMIN: 3.2 g/dL — AB (ref 3.4–5.0)
AST: 19 U/L (ref 15–37)
Alkaline Phosphatase: 83 U/L
Anion Gap: 8 (ref 7–16)
BUN: 13 mg/dL (ref 7–18)
Bilirubin,Total: 1 mg/dL (ref 0.2–1.0)
CHLORIDE: 98 mmol/L (ref 98–107)
Calcium, Total: 9 mg/dL (ref 8.5–10.1)
Co2: 29 mmol/L (ref 21–32)
Creatinine: 1.05 mg/dL (ref 0.60–1.30)
EGFR (Non-African Amer.): >60
Glucose: 115 mg/dL — ABNORMAL HIGH (ref 65–99)
Osmolality: 271 (ref 275–301)
POTASSIUM: 3.9 mmol/L (ref 3.5–5.1)
SGPT (ALT): 23 U/L (ref 12–78)
SODIUM: 135 mmol/L — AB (ref 136–145)
TOTAL PROTEIN: 7.7 g/dL (ref 6.4–8.2)

## 2013-12-29 LAB — URINALYSIS, COMPLETE
BILIRUBIN, UR: NEGATIVE
GLUCOSE, UR: NEGATIVE mg/dL (ref 0–75)
KETONE: NEGATIVE
NITRITE: POSITIVE
Ph: 5 (ref 4.5–8.0)
Protein: 100
RBC,UR: 143 /HPF (ref 0–5)
SPECIFIC GRAVITY: 1.018 (ref 1.003–1.030)
WBC UR: 2063 /HPF (ref 0–5)

## 2013-12-29 LAB — CBC
HCT: 49.5 % (ref 40.0–52.0)
HGB: 15.7 g/dL (ref 13.0–18.0)
MCH: 27.4 pg (ref 26.0–34.0)
MCHC: 31.8 g/dL — ABNORMAL LOW (ref 32.0–36.0)
MCV: 86 fL (ref 80–100)
Platelet: 213 10*3/uL (ref 150–440)
RBC: 5.74 10*6/uL (ref 4.40–5.90)
RDW: 13.7 % (ref 11.5–14.5)
WBC: 19.6 10*3/uL — ABNORMAL HIGH (ref 3.8–10.6)

## 2013-12-30 LAB — BASIC METABOLIC PANEL
ANION GAP: 6 — AB (ref 7–16)
BUN: 20 mg/dL — AB (ref 7–18)
CALCIUM: 8.6 mg/dL (ref 8.5–10.1)
CHLORIDE: 100 mmol/L (ref 98–107)
Co2: 29 mmol/L (ref 21–32)
Creatinine: 1.25 mg/dL (ref 0.60–1.30)
EGFR (Non-African Amer.): 59 — ABNORMAL LOW
Glucose: 134 mg/dL — ABNORMAL HIGH (ref 65–99)
Osmolality: 275 (ref 275–301)
POTASSIUM: 3.9 mmol/L (ref 3.5–5.1)
Sodium: 135 mmol/L — ABNORMAL LOW (ref 136–145)

## 2013-12-30 LAB — CBC WITH DIFFERENTIAL/PLATELET
Comment - H1-Com1: NORMAL
Comment - H1-Com2: NORMAL
HCT: 47 % (ref 40.0–52.0)
HGB: 14.9 g/dL (ref 13.0–18.0)
Lymphocytes: 6 %
MCH: 27.2 pg (ref 26.0–34.0)
MCHC: 31.7 g/dL — ABNORMAL LOW (ref 32.0–36.0)
MCV: 86 fL (ref 80–100)
MONOS PCT: 11 %
PLATELETS: 197 10*3/uL (ref 150–440)
RBC: 5.49 10*6/uL (ref 4.40–5.90)
RDW: 13.8 % (ref 11.5–14.5)
SEGMENTED NEUTROPHILS: 83 %
WBC: 19.7 10*3/uL — AB (ref 3.8–10.6)

## 2013-12-31 LAB — URINE CULTURE

## 2013-12-31 LAB — BASIC METABOLIC PANEL
ANION GAP: 5 — AB (ref 7–16)
BUN: 18 mg/dL (ref 7–18)
CO2: 30 mmol/L (ref 21–32)
Calcium, Total: 8.7 mg/dL (ref 8.5–10.1)
Chloride: 106 mmol/L (ref 98–107)
Creatinine: 1.15 mg/dL (ref 0.60–1.30)
GLUCOSE: 104 mg/dL — AB (ref 65–99)
Osmolality: 283 (ref 275–301)
Potassium: 3.8 mmol/L (ref 3.5–5.1)
SODIUM: 141 mmol/L (ref 136–145)

## 2013-12-31 LAB — WBC: WBC: 12.4 10*3/uL — ABNORMAL HIGH (ref 3.8–10.6)

## 2014-01-03 LAB — CULTURE, BLOOD (SINGLE)

## 2014-01-17 DIAGNOSIS — M159 Polyosteoarthritis, unspecified: Secondary | ICD-10-CM | POA: Diagnosis not present

## 2014-01-17 DIAGNOSIS — R5381 Other malaise: Secondary | ICD-10-CM | POA: Diagnosis not present

## 2014-01-17 DIAGNOSIS — R5383 Other fatigue: Secondary | ICD-10-CM | POA: Diagnosis not present

## 2014-01-17 DIAGNOSIS — I10 Essential (primary) hypertension: Secondary | ICD-10-CM | POA: Diagnosis not present

## 2014-01-17 DIAGNOSIS — N390 Urinary tract infection, site not specified: Secondary | ICD-10-CM | POA: Diagnosis not present

## 2014-01-17 DIAGNOSIS — R319 Hematuria, unspecified: Secondary | ICD-10-CM | POA: Diagnosis not present

## 2014-01-31 DIAGNOSIS — G4710 Hypersomnia, unspecified: Secondary | ICD-10-CM | POA: Diagnosis not present

## 2014-01-31 DIAGNOSIS — G4730 Sleep apnea, unspecified: Secondary | ICD-10-CM | POA: Diagnosis not present

## 2014-01-31 DIAGNOSIS — N390 Urinary tract infection, site not specified: Secondary | ICD-10-CM | POA: Diagnosis not present

## 2014-01-31 DIAGNOSIS — M159 Polyosteoarthritis, unspecified: Secondary | ICD-10-CM | POA: Diagnosis not present

## 2014-01-31 DIAGNOSIS — I10 Essential (primary) hypertension: Secondary | ICD-10-CM | POA: Diagnosis not present

## 2014-01-31 DIAGNOSIS — F411 Generalized anxiety disorder: Secondary | ICD-10-CM | POA: Diagnosis not present

## 2014-01-31 DIAGNOSIS — J309 Allergic rhinitis, unspecified: Secondary | ICD-10-CM | POA: Diagnosis not present

## 2014-03-14 DIAGNOSIS — R5381 Other malaise: Secondary | ICD-10-CM | POA: Diagnosis not present

## 2014-03-14 DIAGNOSIS — I10 Essential (primary) hypertension: Secondary | ICD-10-CM | POA: Diagnosis not present

## 2014-03-14 DIAGNOSIS — R5383 Other fatigue: Secondary | ICD-10-CM | POA: Diagnosis not present

## 2014-03-14 DIAGNOSIS — E782 Mixed hyperlipidemia: Secondary | ICD-10-CM | POA: Diagnosis not present

## 2014-03-14 DIAGNOSIS — Z125 Encounter for screening for malignant neoplasm of prostate: Secondary | ICD-10-CM | POA: Diagnosis not present

## 2014-04-03 DIAGNOSIS — I739 Peripheral vascular disease, unspecified: Secondary | ICD-10-CM | POA: Diagnosis not present

## 2014-04-03 DIAGNOSIS — E782 Mixed hyperlipidemia: Secondary | ICD-10-CM | POA: Diagnosis not present

## 2014-04-03 DIAGNOSIS — I10 Essential (primary) hypertension: Secondary | ICD-10-CM | POA: Diagnosis not present

## 2014-04-03 DIAGNOSIS — R5383 Other fatigue: Secondary | ICD-10-CM | POA: Diagnosis not present

## 2014-04-03 DIAGNOSIS — IMO0001 Reserved for inherently not codable concepts without codable children: Secondary | ICD-10-CM | POA: Diagnosis not present

## 2014-04-03 DIAGNOSIS — M25579 Pain in unspecified ankle and joints of unspecified foot: Secondary | ICD-10-CM | POA: Diagnosis not present

## 2014-04-03 DIAGNOSIS — R5381 Other malaise: Secondary | ICD-10-CM | POA: Diagnosis not present

## 2014-06-05 DIAGNOSIS — I739 Peripheral vascular disease, unspecified: Secondary | ICD-10-CM | POA: Diagnosis not present

## 2014-06-05 DIAGNOSIS — G4733 Obstructive sleep apnea (adult) (pediatric): Secondary | ICD-10-CM | POA: Diagnosis not present

## 2014-06-13 DIAGNOSIS — I10 Essential (primary) hypertension: Secondary | ICD-10-CM | POA: Diagnosis not present

## 2014-06-13 DIAGNOSIS — D291 Benign neoplasm of prostate: Secondary | ICD-10-CM | POA: Diagnosis not present

## 2014-06-13 DIAGNOSIS — R0602 Shortness of breath: Secondary | ICD-10-CM | POA: Diagnosis not present

## 2014-06-13 DIAGNOSIS — M25551 Pain in right hip: Secondary | ICD-10-CM | POA: Diagnosis not present

## 2014-06-13 DIAGNOSIS — E781 Pure hyperglyceridemia: Secondary | ICD-10-CM | POA: Diagnosis not present

## 2014-06-13 DIAGNOSIS — I739 Peripheral vascular disease, unspecified: Secondary | ICD-10-CM | POA: Diagnosis not present

## 2014-07-08 DIAGNOSIS — N400 Enlarged prostate without lower urinary tract symptoms: Secondary | ICD-10-CM | POA: Diagnosis not present

## 2014-07-11 IMAGING — CR DG ANKLE COMPLETE 3+V*L*
1 series · 5 of 5 positions shown · non-contrast
Comparison: none

REASON FOR EXAM: PAIN
COMMENTS:

[Series 1: ap · 0.17mm/px · 5 of 5 slices shown]
[im 1/5]
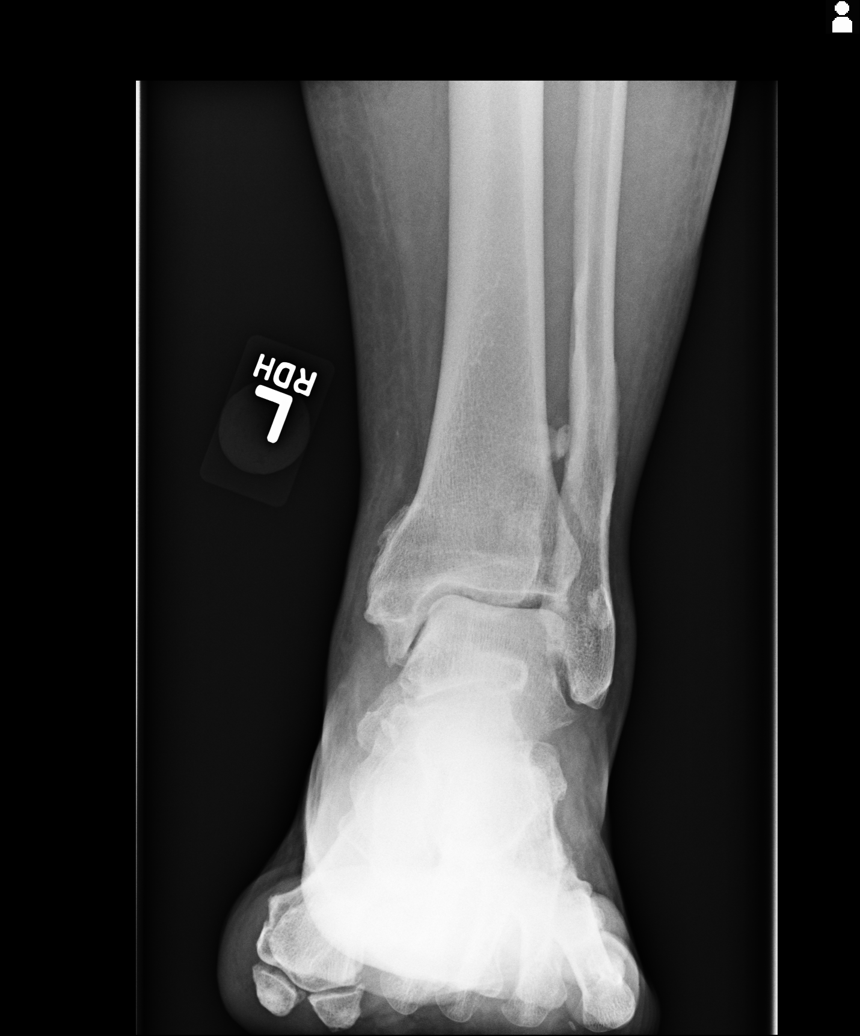
[im 2/5]
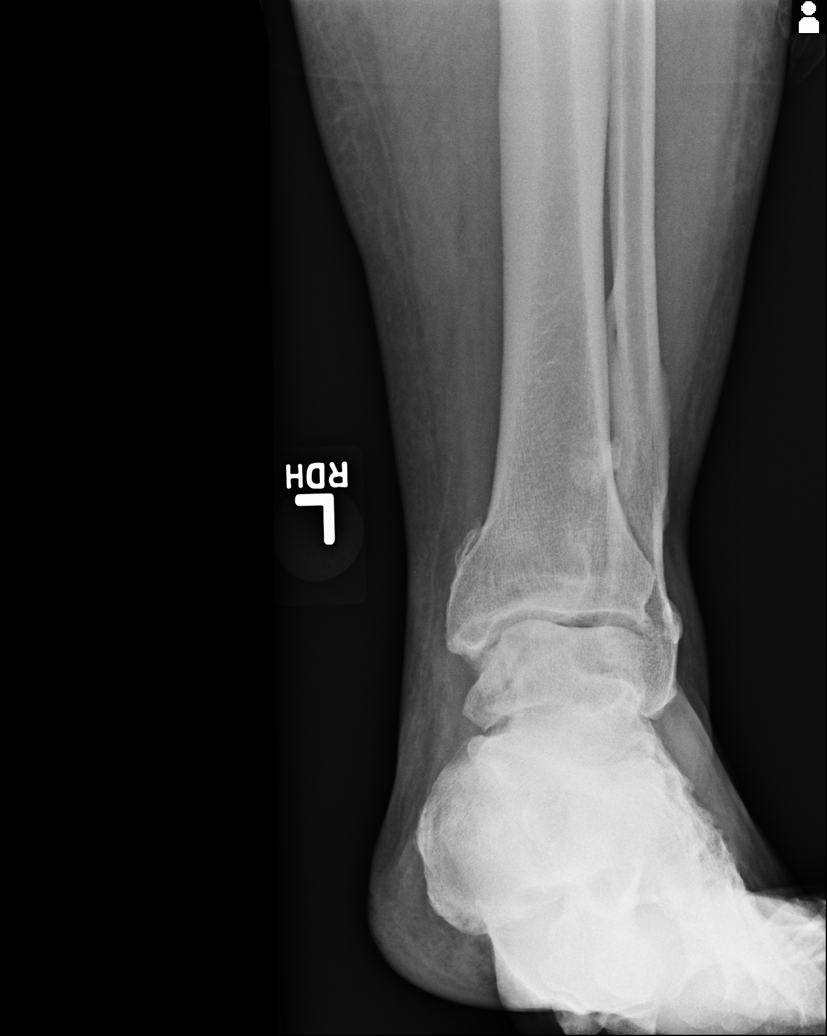
[im 3/5]
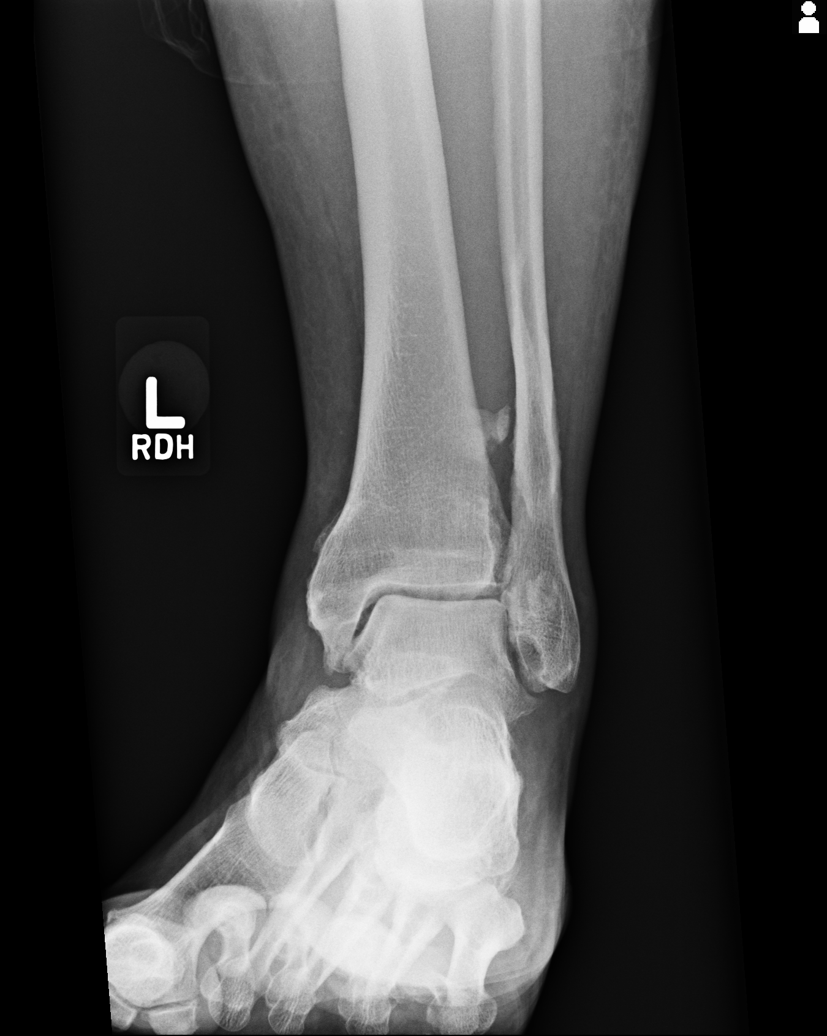
[im 4/5]
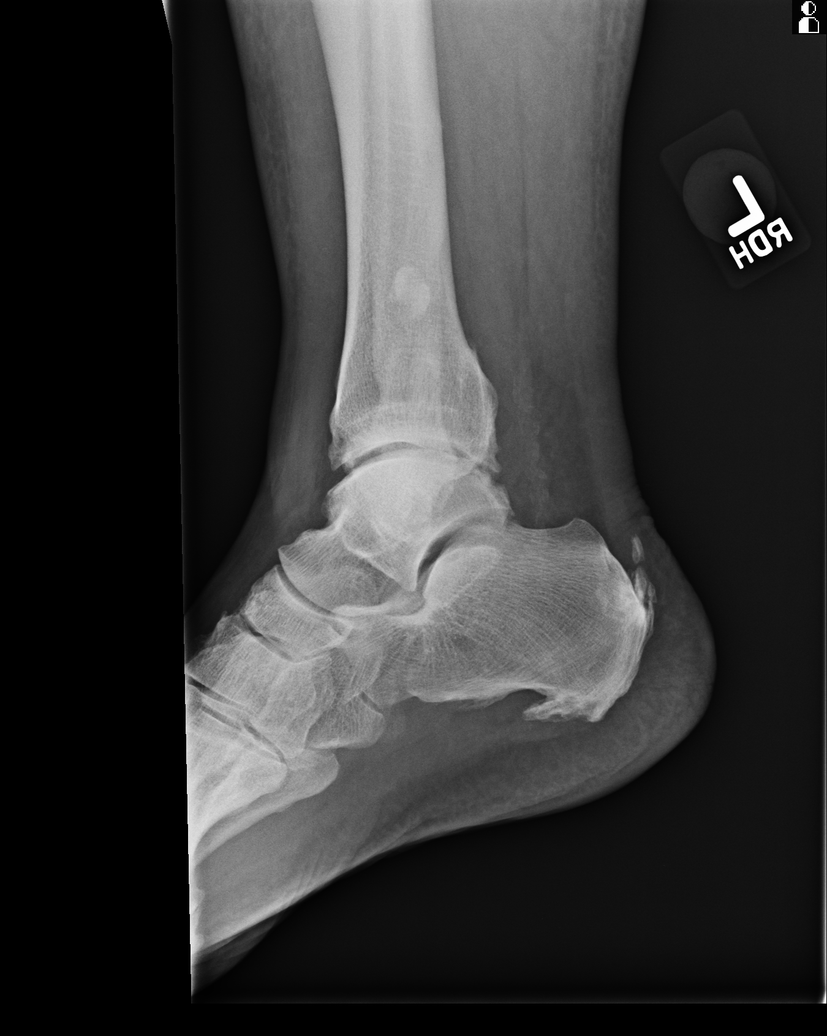
[im 5/5]
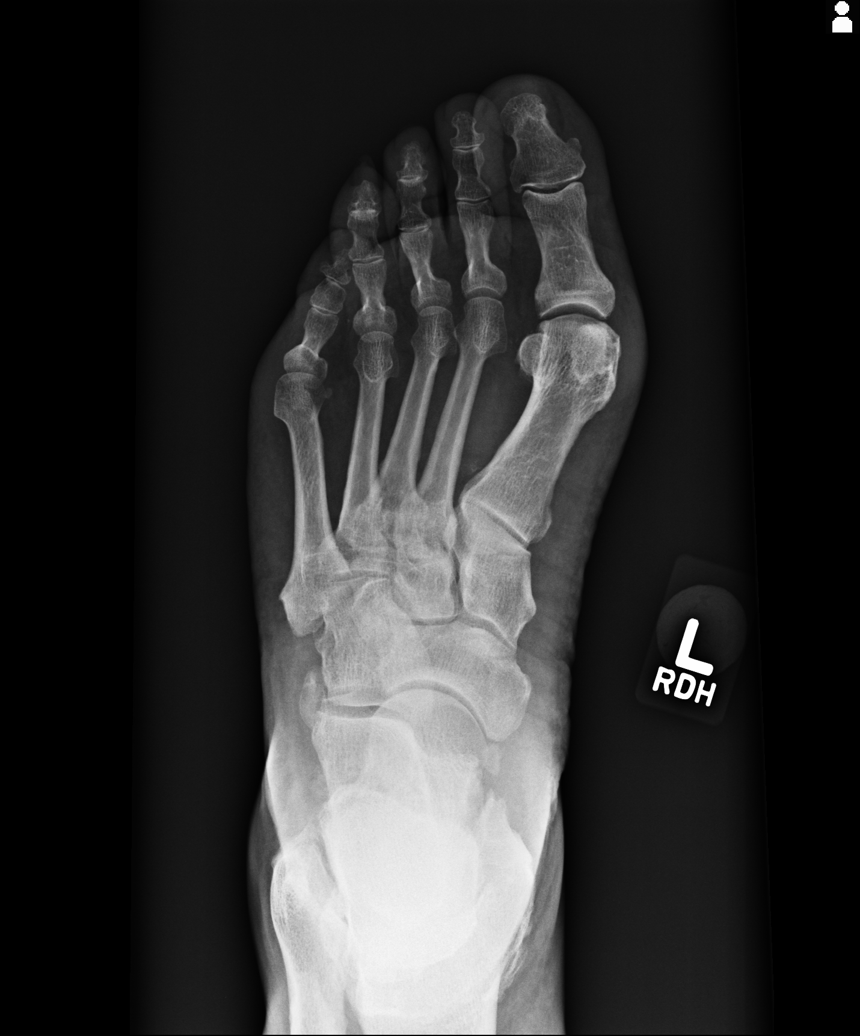

[5 of 5 positions shown; findings below may reference images not displayed]

PROCEDURE:     KDR - KDXR ANKLE LEFT COMPLETE  - October 26, 2012 [DATE]

RESULT:     Left ankle images demonstrate multiple areas of ossification
along the interosseous membrane between the tibia and fibula distally. There
is prominent spurring in the Achilles and plantar regions of the calcaneus.
Tibiotalar degenerative changes are seen. There is no definite fracture,
dislocation or radiopaque foreign body.
IMPRESSION: No acute bony abnormality evident. Chronic changes are
present.

[REDACTED]

## 2014-07-11 IMAGING — CR RIGHT FOOT COMPLETE - 3+ VIEW
1 series · 3 of 3 positions shown · non-contrast
Comparison: none

REASON FOR EXAM: PAIN
COMMENTS:

[Series 1: ap · 0.17mm/px · 3 of 3 slices shown]
[im 1/3]
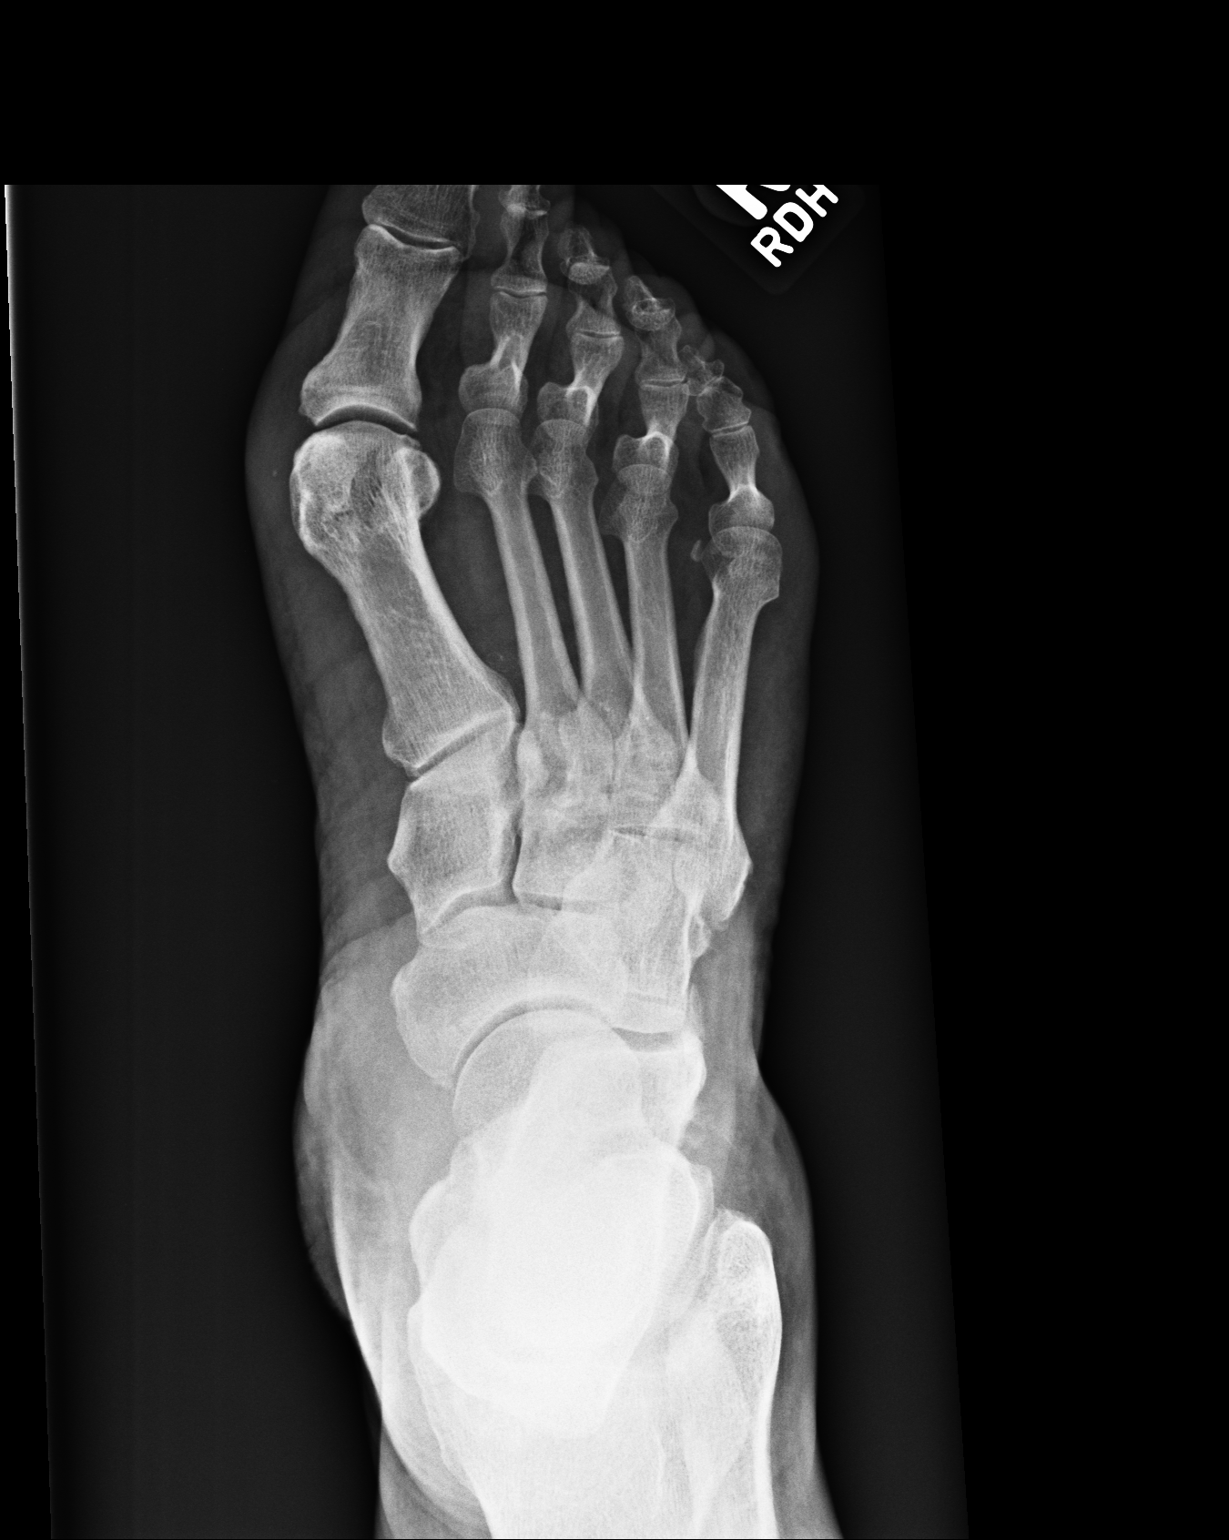
[im 2/3]
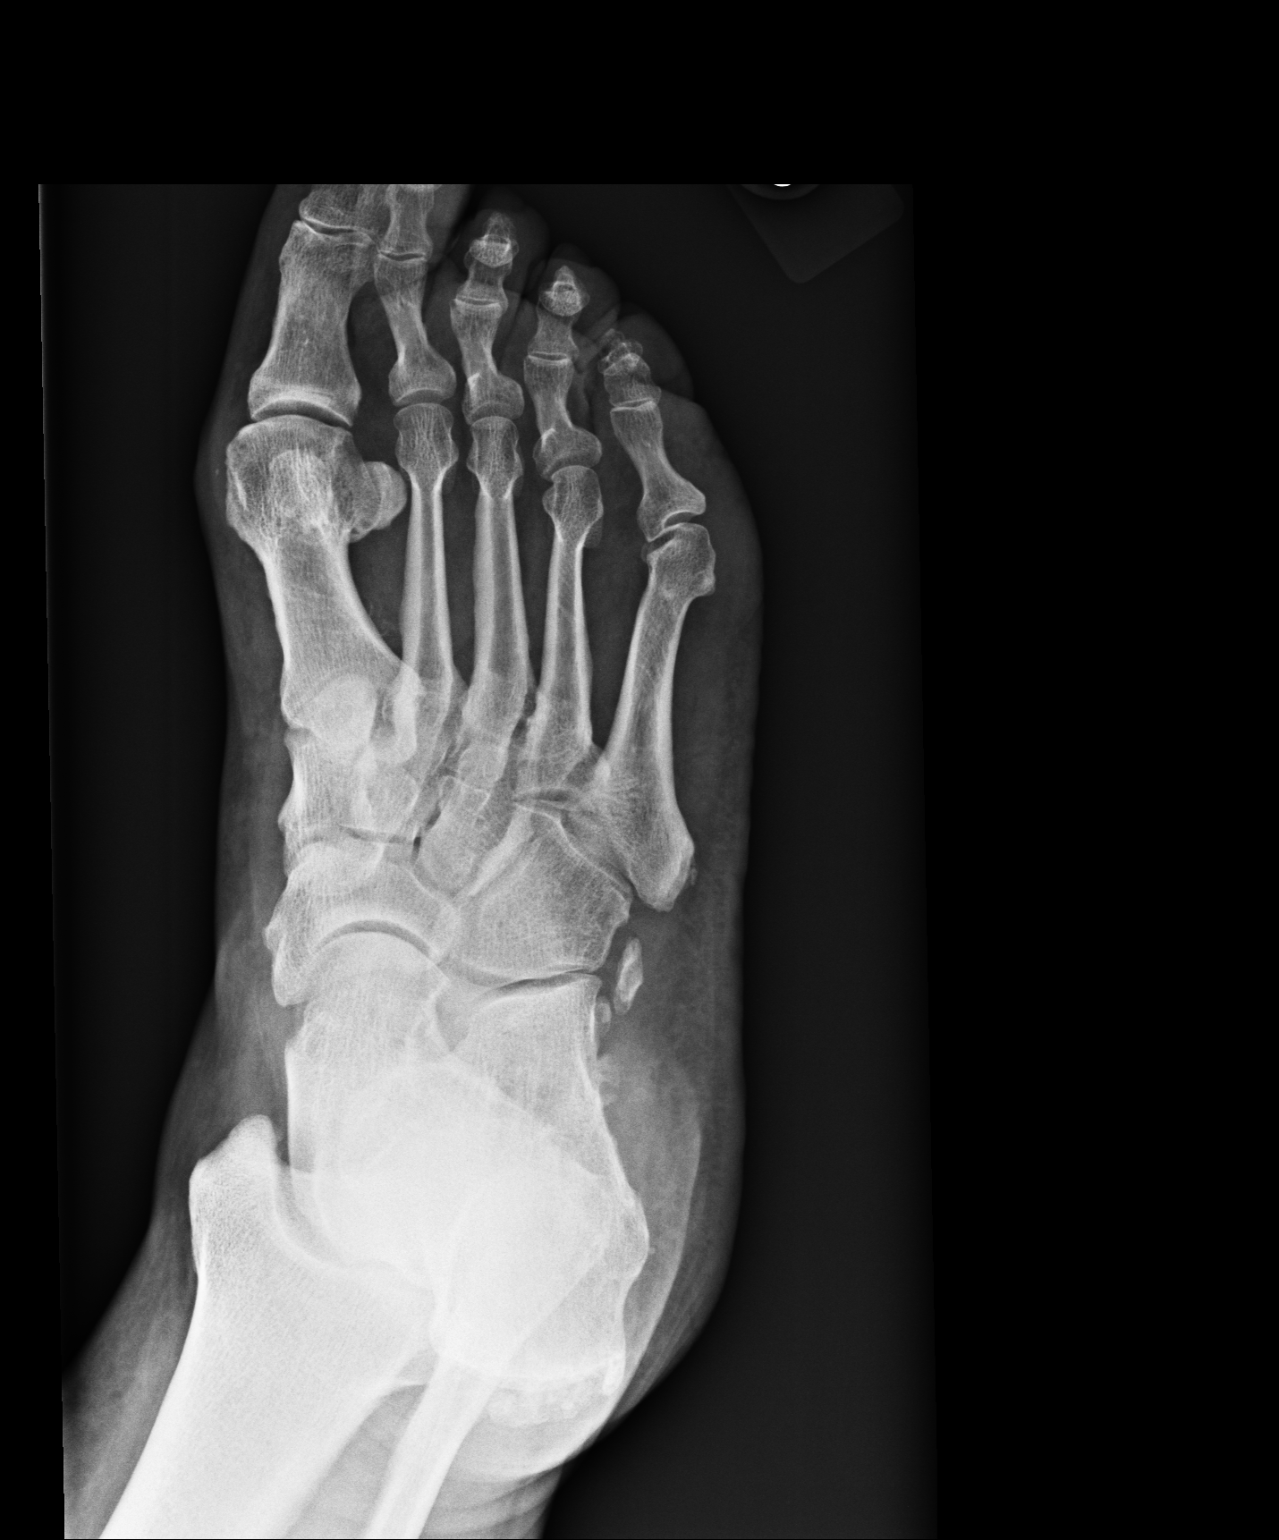
[im 3/3]
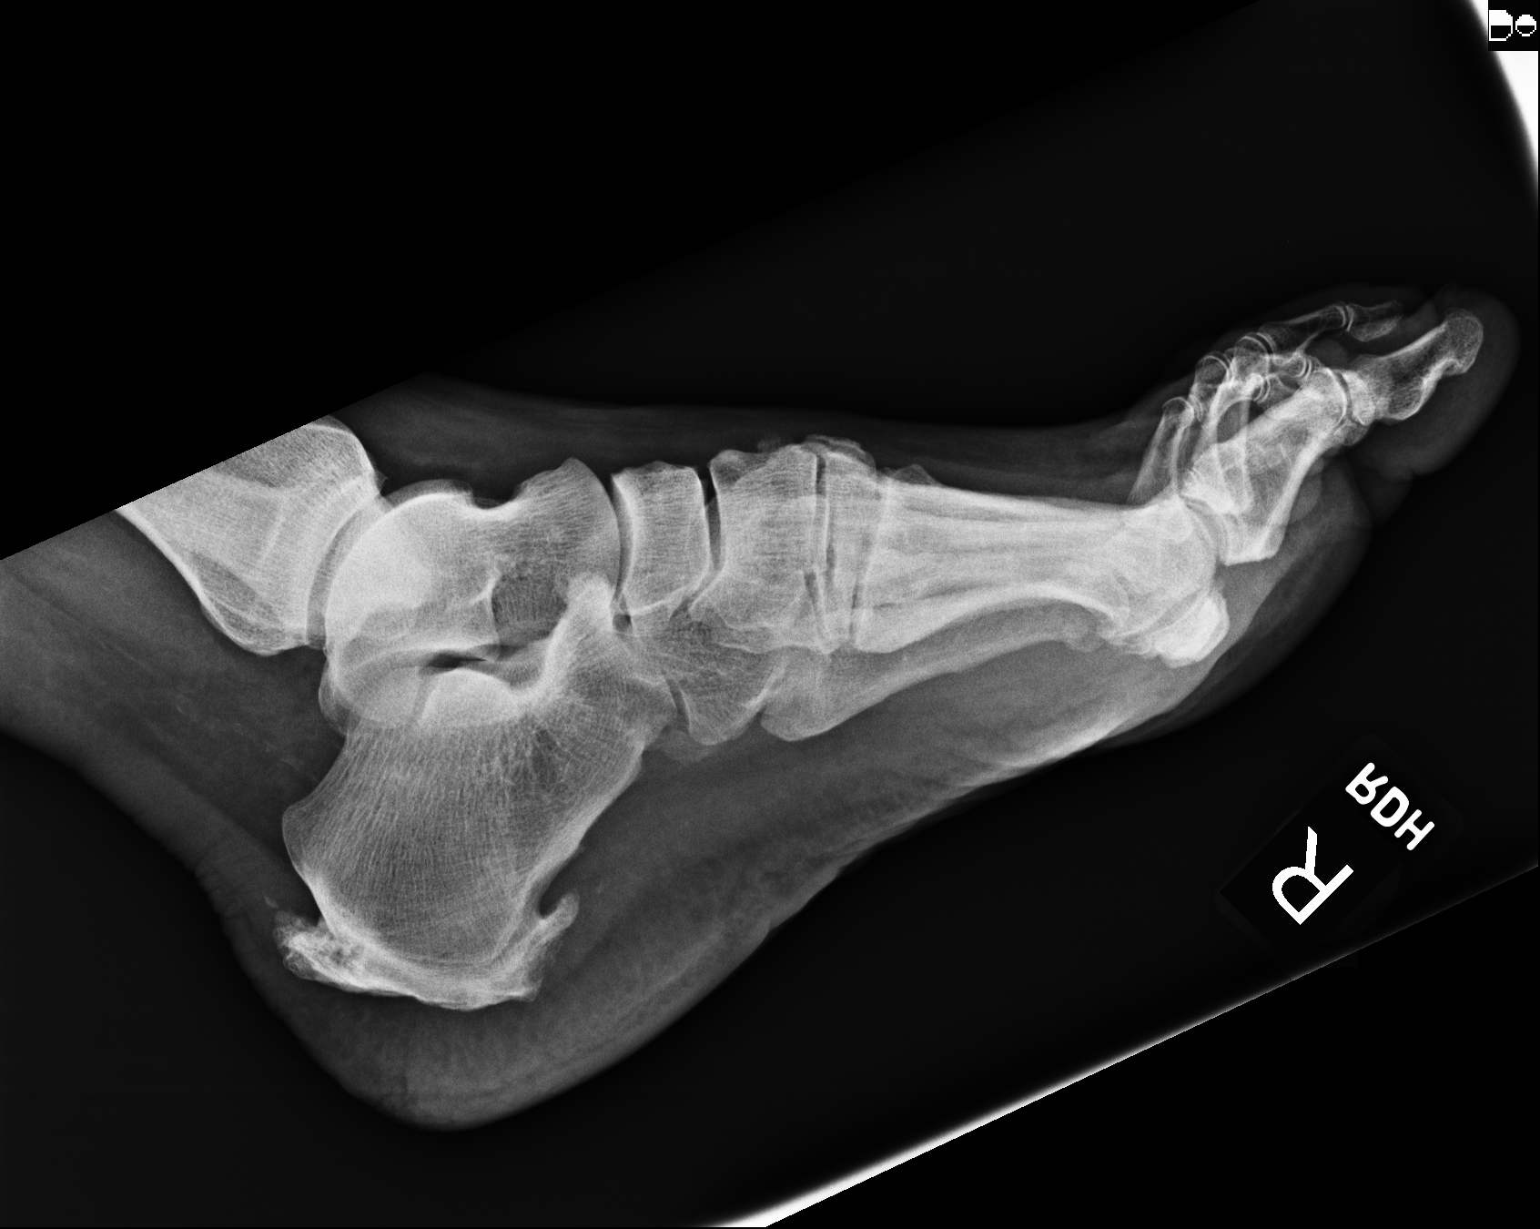

[3 of 3 positions shown; findings below may reference images not displayed]

PROCEDURE:     KDR - KDXR FOOT RT COMPLETE W/OBLIQUES  - October 26, 2012 [DATE]

RESULT:     Right foot images demonstrate multifocal degenerative changes
with some enter phalangeal narrowing in the toes and tarsometatarsal
degenerative change as well as intertarsal degenerative change. Accessory
ossicle present adjacent to the lateral cuboid. There are prominent spurs in
the Achilles and plantar regions of the calcaneus. A definite fractures not
appreciated.
IMPRESSION: 1. Degenerative changes of the right foot as described.

[REDACTED]

## 2014-07-11 IMAGING — CR RIGHT ANKLE - COMPLETE 3+ VIEW
1 series · 4 of 4 positions shown · non-contrast
Comparison: none

REASON FOR EXAM: PAIN
COMMENTS:

PROCEDURE:     KDR - KDXR ANKLE RIGHT COMPLETE  - October 26, 2012 [DATE]
RESULT:     Right ankle images demonstrate no definite fracture, dislocation
or radiopaque foreign body.

[Series 1: ap · 0.17mm/px · 4 of 4 slices shown]
[im 1/4]
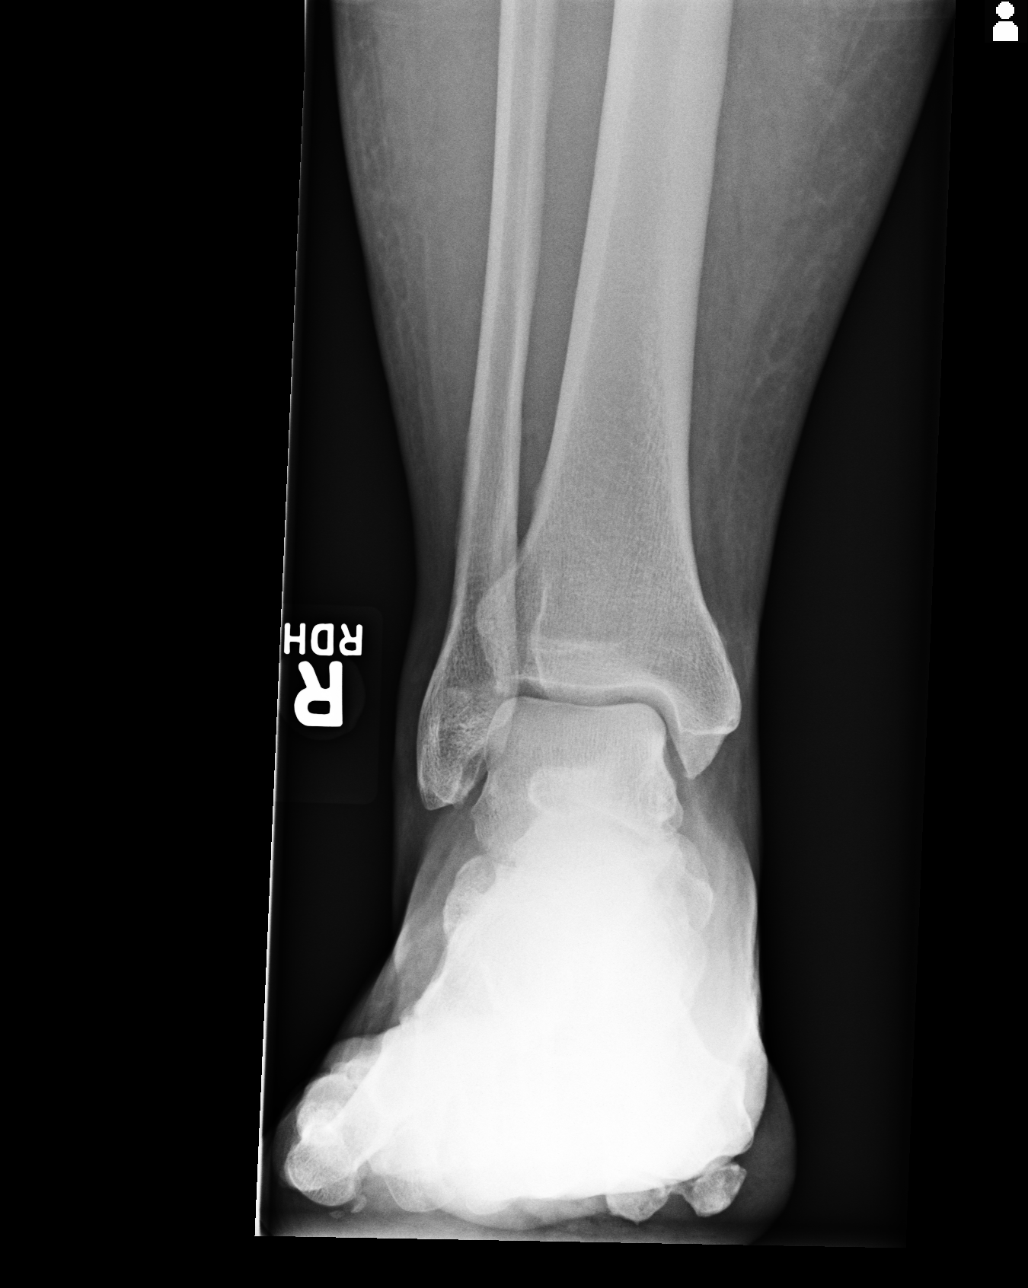
[im 2/4]
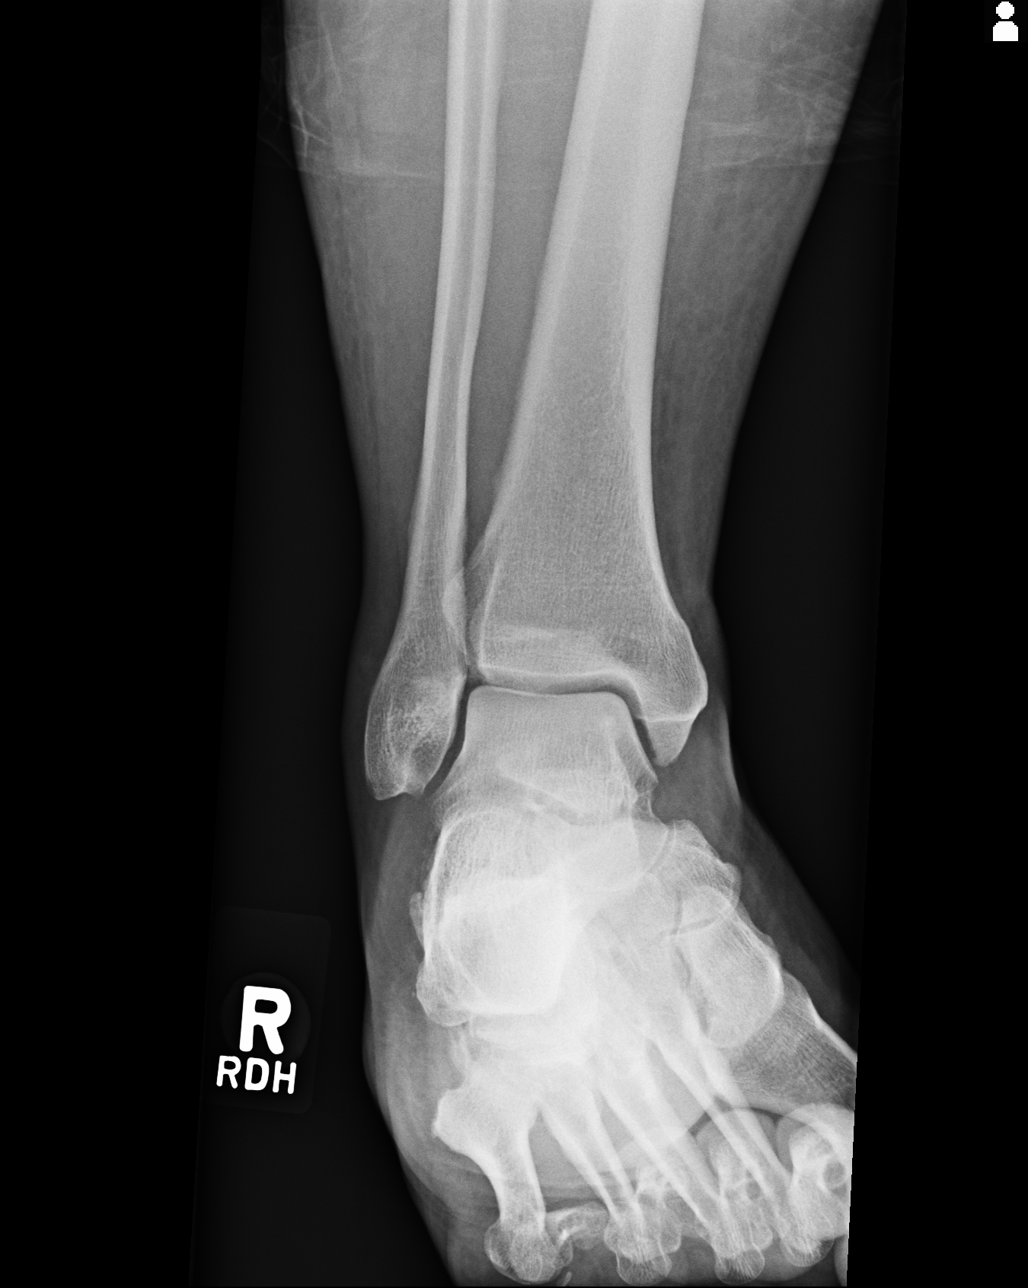
[im 3/4]
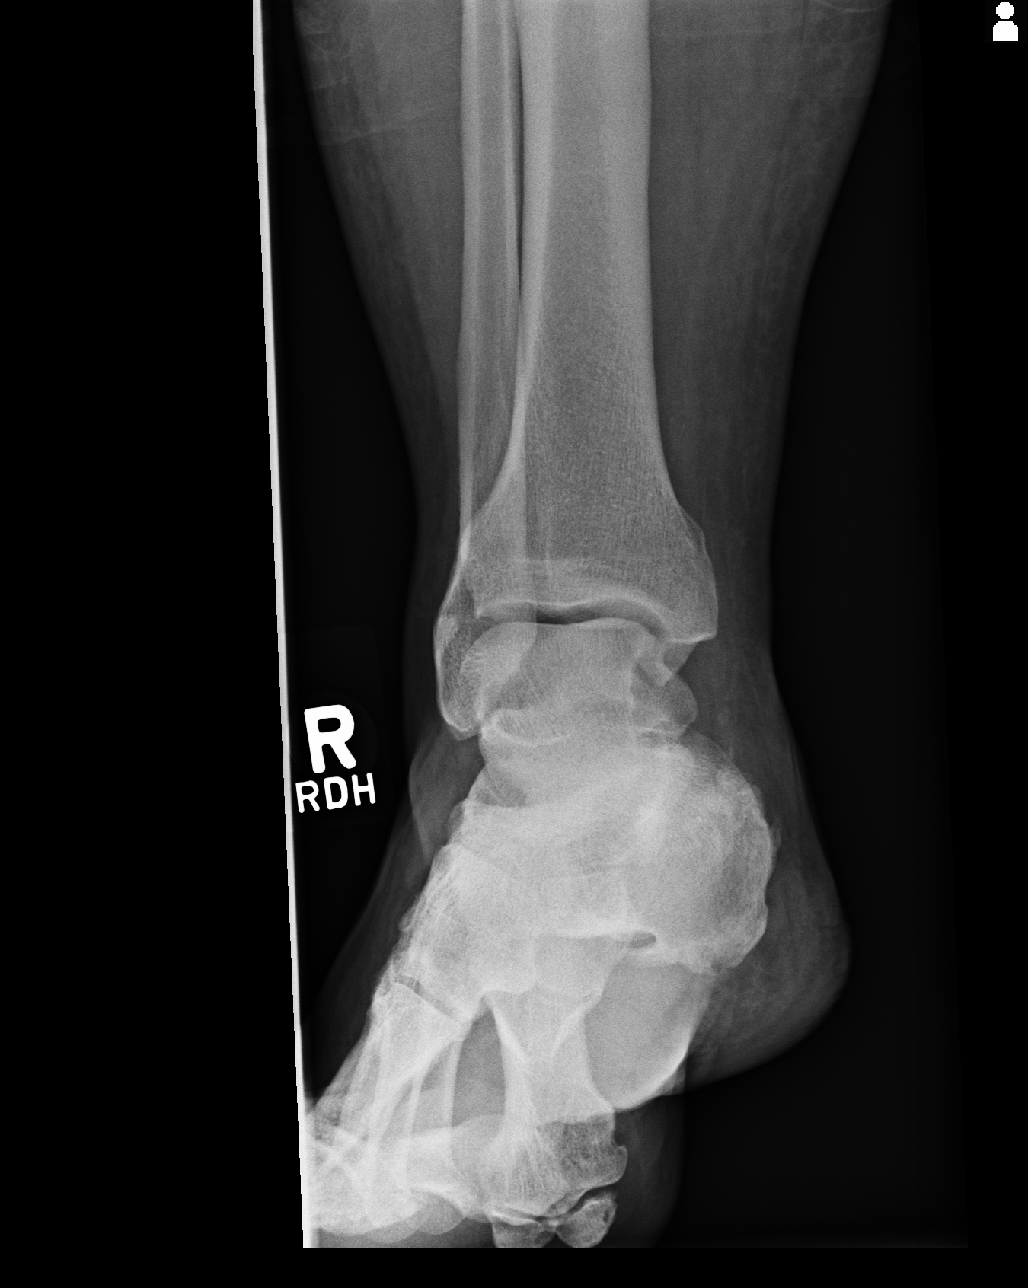
[im 4/4]
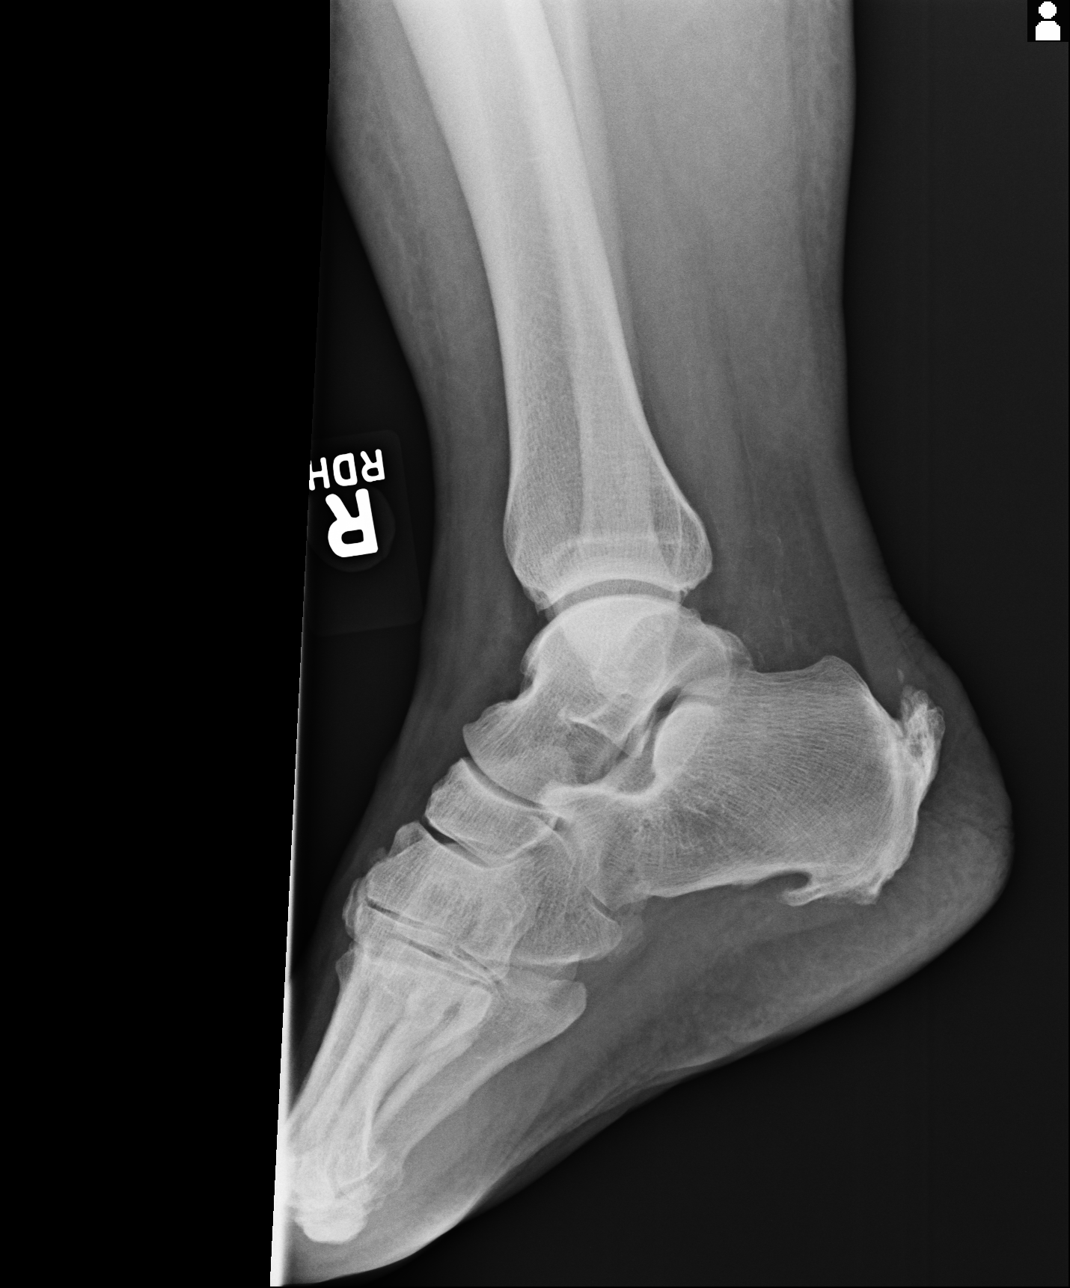

[4 of 4 positions shown; findings below may reference images not displayed]

IMPRESSION: No acute bony abnormality. There are prominent spurs noted
in the Achilles and plantar regions of the calcaneus as noted on the foot
radiograph.

[REDACTED]

## 2014-08-29 ENCOUNTER — Ambulatory Visit: Payer: Self-pay

## 2014-08-29 DIAGNOSIS — M25551 Pain in right hip: Secondary | ICD-10-CM | POA: Diagnosis not present

## 2014-08-29 DIAGNOSIS — M1611 Unilateral primary osteoarthritis, right hip: Secondary | ICD-10-CM | POA: Diagnosis not present

## 2014-08-29 DIAGNOSIS — N3281 Overactive bladder: Secondary | ICD-10-CM | POA: Diagnosis not present

## 2014-10-24 DIAGNOSIS — M2011 Hallux valgus (acquired), right foot: Secondary | ICD-10-CM | POA: Diagnosis not present

## 2014-10-24 DIAGNOSIS — K580 Irritable bowel syndrome with diarrhea: Secondary | ICD-10-CM | POA: Diagnosis not present

## 2014-10-24 DIAGNOSIS — M25551 Pain in right hip: Secondary | ICD-10-CM | POA: Diagnosis not present

## 2014-10-24 DIAGNOSIS — I10 Essential (primary) hypertension: Secondary | ICD-10-CM | POA: Diagnosis not present

## 2014-10-24 DIAGNOSIS — R0602 Shortness of breath: Secondary | ICD-10-CM | POA: Diagnosis not present

## 2014-10-24 DIAGNOSIS — M150 Primary generalized (osteo)arthritis: Secondary | ICD-10-CM | POA: Diagnosis not present

## 2014-10-31 ENCOUNTER — Ambulatory Visit (INDEPENDENT_AMBULATORY_CARE_PROVIDER_SITE_OTHER): Payer: BLUE CROSS/BLUE SHIELD

## 2014-10-31 ENCOUNTER — Ambulatory Visit (INDEPENDENT_AMBULATORY_CARE_PROVIDER_SITE_OTHER): Payer: BLUE CROSS/BLUE SHIELD | Admitting: Podiatry

## 2014-10-31 VITALS — BP 142/81 | HR 71 | Resp 16 | Ht 71.0 in | Wt 332.0 lb

## 2014-10-31 DIAGNOSIS — M2041 Other hammer toe(s) (acquired), right foot: Secondary | ICD-10-CM

## 2014-10-31 DIAGNOSIS — M1990 Unspecified osteoarthritis, unspecified site: Secondary | ICD-10-CM

## 2014-10-31 DIAGNOSIS — M779 Enthesopathy, unspecified: Secondary | ICD-10-CM

## 2014-10-31 DIAGNOSIS — L84 Corns and callosities: Secondary | ICD-10-CM

## 2014-10-31 MED ORDER — TRIAMCINOLONE ACETONIDE 10 MG/ML IJ SUSP
10.0000 mg | Freq: Once | INTRAMUSCULAR | Status: AC
  Administered 2014-10-31: 10 mg

## 2014-10-31 NOTE — Progress Notes (Signed)
   Subjective:    Patient ID: Jimmy Norton., male    DOB: August 17, 1945, 69 y.o.   MRN: [id number]  HPI My toes and feet are hurting on the right foot , callued lesion on the side and my left ankle i broke it years ago and that is giving me a fit    Review of Systems     Objective:   Physical Exam        Assessment & Plan:

## 2014-11-02 NOTE — Progress Notes (Signed)
Subjective:     Patient ID: Jimmy Norton., male   DOB: 30-Nov-1945, 69 y.o.   MRN: [id number]  HPI patient presents stating I have a lot of pain on the outside of my right foot with inflammation and fluid and also underneath my first metatarsal right that makes ambulation difficult been present for a long time and that his ankle also hurts left secondary to breaking it years ago   Review of Systems  All other systems reviewed and are negative.      Objective:   Physical Exam  Constitutional: He is oriented to person, place, and time.  Cardiovascular: Intact distal pulses.   Musculoskeletal: Normal range of motion.  Neurological: He is oriented to person, place, and time.  Skin: Skin is warm and dry.  Nursing note and vitals reviewed.  neurovascular status is found to be intact with muscle strength adequate range of motion diminished within the subtalar and ankle joint left normal on the right with a severe callus on the right fifth metatarsal with fluid buildup and underneath the right foot. 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 Today I injected the sinus tarsi left 3 Milligan Kenalog 5 mg Xylocaine and injected the right fifth MPJ 3 mg dexamethasone Kenalog 5 mg Xylocaine and did deep debridement of lesion. This can be done periodically and we will see back when symptomatic

## 2014-11-15 NOTE — Discharge Summary (Signed)
PATIENT NAME:  Jimmy Norton, Jimmy Norton MR#:  [id number] DATE OF BIRTH:  1946-07-13  DATE OF ADMISSION:  12/29/2013 DATE OF DISCHARGE:  12/31/2013  ADMITTING PHYSICIAN: Shreyang H. Posey Pronto, MD  DISCHARGING PHYSICIAN: Gladstone Lighter, MD  PRIMARY CARE PHYSICIAN: Lavera Guise, MD  [place]: None.   DISCHARGE DIAGNOSES: 1.  Sepsis.  2.  Escherichia coli urinary tract infection.  3.  Hypertension.  4.  Obstructive sleep apnea.  5.  Depression and anxiety.  6.  Nonspecific splenomegaly.   DISCHARGE HOME MEDICATIONS:  1.  Amlodipine 10 mg p.o. daily.  2.  Osteo Bi-Flex 2 tablets p.o. daily.  3.  Equate 1 tablet once a day.   4.  Vitamin B12 1000 mcg p.o. daily.  5.  Vitamin B6 200 mg p.o. daily.  6.  Vitamin D3 1000 international units p.o. daily.  7.  Aspirin 325 mg p.o. daily.  8.  Elavil 25 mg p.o. at bedtime.  9.  Etodolac 400 mg p.o. 3 times a day as needed.  10.  Niacin 500 mg p.o. once a day at bedtime.  11.  Probiotic once a day.  12.  Levaquin 500 mg daily for 5 days.   DISCHARGE DIET: Low-sodium diet.   DISCHARGE ACTIVITY: As tolerated.    FOLLOWUP INSTRUCTIONS:  PCP followup in 1 to 2 weeks.   LABORATORY, DIAGNOSTIC AND RADIOLOGICAL DATA PRIOR TO DISCHARGE:   1.  WBC is 12.4, hemoglobin 14.9, hematocrit 47.0, platelet count 197.  2.  Sodium 141, potassium 3.8, chloride 106, bicarbonate 30, BUN 18, creatinine 1.15, glucose 104 and calcium of 8.79  3.  Blood cultures are negative. Urine culture is positive for pansensitive Escherichia coli.  4.  CT of the abdomen and pelvis showing tiny nonobstructive right renal calculi. No evidence of any obstruction, no ureteral calculi, hydronephrosis. Bilateral renal parapelvic cysts are present. Cholelithiasis, diverticulosis.  5.  Urinalysis 3+ leukocyte esterase, nitrate positive and WBCs and bacteria noted.   BRIEF HOSPITAL COURSE: [name] is an obese 69 year old Caucasian male with past medical history  significant for sleep apnea on CPAP, hypertension, hyperlipidemia who presents to the hospital secondary to urinary incontinence, fever and confusion. He was seen at a walk-in clinic and sent over to the ER.  1.  Sepsis secondary to urinary tract infection, prior history of UTIs present. He also has renal calculi but they are nonobstructing and CT of the abdomen does not show any hydronephrosis, no pyelonephritis. Urine cultures are growing pansensitive Escherichia coli. The patient was started on Levaquin. He had a white count of 19,000 and temperature of 101 degrees Fahrenheit on admission which resolved. WBCs improved to 12,000 at this time. He is being discharged on p.o. Levaquin at this time. He was given IV fluids while in the hospital as well.  2.  Hyperlipidemia. His home medications are being continued.  3.  Hypertension, on Norvasc and also multiple supplements.  4.  Sleep apnea, on CPAP.   His course has been otherwise uneventful in the hospital.   DISCHARGE CONDITION: Stable.   DISCHARGE DISPOSITION: Home.  TIME SPENT ON DISCHARGE: 40 minutes.   ____________________________ Gladstone Lighter, MD rk:cs D: 12/31/2013 14:44:27 ET T: 12/31/2013 15:26:06 ET JOB#: [id number]  cc: Gladstone Lighter, MD, <Dictator> Lavera Guise, MD Gladstone Lighter MD ELECTRONICALLY SIGNED 01/04/2014 15:58

## 2014-11-15 NOTE — H&P (Signed)
PATIENT NAME:  Jimmy Norton, Jimmy Norton MR#:  [id number] DATE OF BIRTH:  04-16-1946  DATE OF ADMISSION:  12/29/2013  PRIMARY CARE PROVIDER: [place]   ED REFERRING PHYSICIAN: Dr. Delman Kitten   CHIEF COMPLAINT: Fever, confusion.  HISTORY OF PRESENT ILLNESS: The patient is a 69 year old white male with history of morbid obesity, hypertension, hyperlipidemia, splenomegaly. Has a history of urinary tract infections in the past. Comes in with complaint of urinary incontinence, fevers, severe chills. According to the family, he has been a little confused today, and was dizzy yesterday. He also had a temperature at home. The patient denies any chest pain or shortness of breath. Complains of urinary frequency, hesitancy, burning with urination, difficulty with starting stream. He denies any nausea, vomiting, or diarrhea. Complains of suprapubic pain as well as back pain.   PAST MEDICAL HISTORY SIGNIFICANT FOR: 1. Morbid obesity.  2. Hyperlipidemia.  3. Hypertension.  4. Osteoarthritis.  5. Status post bilateral carpal tunnel surgery.  6. Status post hernia repair.  7. Status post neurosurgery.  8. Sleep apnea, patient uses CPAP at nighttime.  ALLERGIES: PENICILLIN.   CURRENT MEDICATIONS: He is on amlodipine 10 daily, sertraline 50 daily.   SOCIAL HISTORY: The patient chews tobacco. No history of alcohol abuse or drug use.   FAMILY HISTORY: Positive for hypertension, diabetes, CVA, coronary artery disease in the mother.  REVIEW OF SYSTEMS:   CONSTITUTIONAL: Complains of fever. Complains of back pain, suprapubic  pain. No weight loss, no weight gain.  EYES: No blurred or double vision. No pain. No redness or inflammation.  ENT: No tinnitus. No ear pain. No hearing loss. No seasonal or year-round allergies. No difficulty swallowing.  RESPIRATORY: Denies any cough, wheezing, hemoptysis. No COPD. Has sleep apnea. Uses CPAP at nighttime.   GASTROINTESTINAL: Denies any nausea, vomiting, diarrhea.  Complains of suprapubic abdominal pain. No hematemesis. No melena.  GENITOURINARY: Denies frequency, hesitancy.  ENDOCRINE: Denies any polyuria, nocturia or thyroid problems.  HEMATOLOGIC AND LYMPHATIC: Denies anemia, easy bruisability or bleeding.  SKIN: No acne. No rash. No changes in mole, hair or skin.  MUSCULOSKELETAL: Complains of back pain. NEUROLOGIC: No numbness, CVA, TIA.   PSYCHIATRIC: Has anxiety disorder.   PHYSICAL EXAMINATION: VITAL SIGNS: Temperature 101, pulse 98, respirations 22, blood pressure 177/85, O2 of 94%.  GENERAL: The patient is a very obese male in no acute distress.  HEENT: Head atraumatic, normocephalic. Pupils equally round, reactive to light and accommodation. There is no conjunctival pallor. No scleral icterus. Nasal exam shows no drainage or ulceration. Oropharynx is clear, without any exudate.  NECK: Supple, without any JVD.  CARDIOVASCULAR: Regular rate and rhythm. No murmurs, rubs, clicks, or gallops.  LUNGS: Clear to auscultation bilaterally without any rales, rhonchi, wheezing.  ABDOMEN: Soft, nontender, nondistended. Positive bowel sounds x 4.  EXTREMITIES: No clubbing, cyanosis, or edema.  SKIN: No rash.  LYMPHATICS: No lymph nodes palpable.  VASCULAR: Good DP, PT pulses.  PSYCHIATRIC: The patient is not anxious.  NEUROLOGIC: Cranial nerves II to XII grossly intact. No focal deficits.   LABS: Glucose 115, BUN 13, creatinine 1.05, sodium 135, potassium 3.9, chloride 98, CO2 is 29, calcium 9.0. LFTs are normal, except albumin of 3.2. WBC count 19.6, hemoglobin 15.7, platelet count 213. Urinalysis: Leukocytes 3+, WBCs 2063.   ASSESSMENT AND PLAN: The patient is a 69 year old white male with history of recurrent urinary tract infections, who presents with fever, some confusion. Noted to have a UTI.    1. Urinary tract infection. At  this time, will treat with IV Levaquin. Blood cultures, urine cultures.  2. Hypertension. The patient will be on Norvasc.  We can add additional p.r.n. medications as needed.  3.  Hyperlipidemia. Continue fenofibrate.  4.  Depression/anxiety. Continue sertraline. 5. Miscellaneous. The patient will be on Lovenox for deep vein thrombosis prophylaxis.  TIME SPENT: 50 minutes.    ____________________________ Lafonda Mosses Posey Pronto, MD shp:mr D: 12/29/2013 19:35:24 ET T: 12/29/2013 20:46:46 ET JOB#: [id number]  cc: Jorge Amparo H. Posey Pronto, MD, <Dictator> Alric Seton MD ELECTRONICALLY SIGNED 01/07/2014 16:52

## 2014-11-16 NOTE — Discharge Summary (Signed)
PATIENT NAME:  Jimmy Norton, Jimmy Norton MR#:  [id number] DATE OF BIRTH:  1946/04/18  DATE OF ADMISSION:  12/17/2011 DATE OF DISCHARGE:  12/21/2011  PRIMARY CARE PHYSICIAN:  Daralene Milch, NP  UROLOGIST:  [facility]  CHIEF COMPLAINT: Flank and back pain, fever.   DISCHARGE DIAGNOSES:  1. Acute pyelonephritis.  2. Escherichia coli bacteremia.  3. Acute renal failure, possible acute on chronic.  4. Congestive heart failure unknown if systolic or diastolic.  5. Hypokalemia.   SECONDARY DIAGNOSES:  1. Obstructive sleep apnea on CPAP.  2. Hyperlipidemia. 3. Hypertension.  4. Status post bilateral carpal tunnel surgery.  5. Osteoarthritis.  6. Obesity.   DISCHARGE MEDICATIONS: 1. Cipro 500 mg every 12 hours for nine days.  2. Norvasc 10 mg daily.  3. Aspirin 325 mg daily.  4. Fenofibrate 160 mg daily.  5. Vitamin D 1000 units daily.  6. Diuretic as previously prescribed.  7. Osteo Bi-Flex triple strength 2 tabs once a day.  8. Vitamin B 12,000 mcg once a day in the morning.   DIET: Low sodium, ADA diet.   ACTIVITY: As tolerated.   FOLLOWUP:  1. Please follow up with the urologist as previously scheduled tomorrow at the [place].  2. Please follow up with primary care physician within one week.   DISPOSITION: Home.   HISTORY OF PRESENT ILLNESS: For full details of the History and Physical, please see the dictation done on 12/17/2011 by Dr. Doy Hutching. Briefly this is a 69 year old Caucasian male with obesity, hypertension, and hyperlipidemia presenting with fever, incontinence of urine, and flank pain and was noted to have pyelonephritis. He was admitted to the hospitalist service for further evaluation and management.   SIGNIFICANT LABS/IMAGING: Initial BUN 36, creatinine 1.65, sodium 134, potassium 3.3. Liver function tests: Albumin 2.6, otherwise within normal limits. Initial WBC 12.2, hemoglobin 13.1, hematocrit 41.1. Initial blood culture on arrival was positive two out of two bottles  for Escherichia coli susceptible to Cipro. Urine cultures have also been positive.  3000 CFU gram variable rods, but did not speciate. Repeat blood cultures done on 05/26 have been no growth to date times two. Urinalysis was positive for nitrites as well as leukocyte esterase, 398 WBCs and 3+ bacteria. CT of the abdomen and pelvis without contrast showed stranding consistent with inflammatory changes identified within the perinephric fat of the right and left kidneys. No significant hydronephrosis or hydroureter.  6.7-mm calculus in the medullary portion of the right kidney. Severe diverticulosis. X-ray chest PA and lateral shows shallow inspiration which accentuates the interstitial marking; component of pulmonary vascular congestion or mild edema cannot be excluded.   HOSPITAL COURSE: The patient was admitted to the hospitalist service. The patient did have a fever and leukocytosis and was started on Cipro and Bactrim with dirty urinalysis. The patient's blood cultures have come back positive for Escherichia coli and the Bactrim was discontinued as susceptibility was done and it was susceptible to Cipro. The patient was also started on IV fluids initially and did well. Blood cultures were repeated, which have been negative. The patient states that he has had several bouts of recent probable UTIs and was given antibiotics. He has been following up at the [facility] with a urologist and has an appointment tomorrow. As his symptoms have improved, he will be discharged with an additional nine days for a total of 14 days of Cipro. He has been afebrile. Leukocytosis has trended down. He was advised to keep his appointment at  [facility] urology tomorrow.  The patient did have renal failure unknown if acute or chronic in nature. Creatinine in 06/2010 was within normal limits. There was no significant hydronephrosis on the CT scan. There was a nonobstructing right-sided nephrolithiasis. The kidney function did improve with IV  fluids. The patient did need a dose of Lasix for increased crackles and some lower extremity edema. He did say that he was started on a fluid pill recently by his doctor after an echocardiogram showed he had congestive heart failure; however, he does not know if it is systolic or diastolic. He is on room oxygen. He is currently not short of breath. An echocardiogram was not repeated and he was advised to follow up with his primary care physician as an outpatient for further care. The patient did have hypokalemia and it was repleted. The patient was resumed on CPAP for his obstructive sleep apnea. At this time he will be discharged with outpatient followup.   TOTAL TIME SPENT: 40 minutes.    ____________________________ Vivien Presto, MD sa:bjt D: 12/21/2011 14:31:56 ET T: 12/21/2011 17:50:27 ET JOB#: [id number]  cc: Vivien Presto, MD, <Dictator> Daralene Milch, NP Vivien Presto MD ELECTRONICALLY SIGNED 12/30/2011 12:38

## 2014-11-16 NOTE — H&P (Signed)
PATIENT NAME:  Jimmy Norton, Jimmy Norton MR#:  [id number] DATE OF BIRTH:  Sep 17, 1945  DATE OF ADMISSION:  12/17/2011  REFERRING PHYSICIAN: Dr. Ulice Brilliant   FAMILY PHYSICIAN: Cinda Quest, NP   REASON FOR ADMISSION: Pyelonephritis.   HISTORY OF PRESENT ILLNESS: The patient is a 69 year old male with a history of obesity, hypertension, and hyperlipidemia who presents to the Emergency Room with fever, incontinence, lower abdominal pain, and left flank and back pain. Some shortness of breath. In the Emergency Room, the patient was noted to be febrile with an elevated white blood cell count. Urinalysis was grossly abnormal. CT of the abdomen revealed evidence of bilateral pyelonephritis. The patient is now admitted for further evaluation.   PAST MEDICAL HISTORY:  1. Obesity.  2. Hyperlipidemia.  3. Benign hypertension.  4. Status post bilateral carpal tunnel surgery.  5. Status post hernia repair.  6. Status post knee surgery. 7. Osteoarthritis.   MEDICATIONS:  1. Norvasc 10 mg p.o. daily.  2. Aspirin 325 mg p.o. daily.  3. Fenofibrate 160 mg p.o. daily. 4. Vitamin D 1000 units p.o. daily.  5. Nerve medicine, name and dose unknown.   ALLERGIES: Penicillin.   SOCIAL HISTORY: The patient used to smoke but none recently. No history of alcohol abuse.   FAMILY HISTORY: Positive for hypertension, diabetes, stroke, and coronary artery disease.   REVIEW OF SYSTEMS: CONSTITUTIONAL: The patient has had fever but no change in weight. EYES: No blurred or double vision. No glaucoma. ENT: No tinnitus or hearing loss. No nasal discharge or bleeding. No difficulty swallowing. RESPIRATORY: Some cough but no wheezing or hemoptysis. No painful respiration. CARDIOVASCULAR: No chest pain or orthopnea. No palpitations or syncope. GI: The patient has had some nausea with one episode of vomiting. Lower abdominal pain. No diarrhea. Denies hematemesis. GU: He's had dysuria but denies hematuria. Some incontinence. ENDOCRINE: No  polyuria or polydipsia. No heat or cold intolerance. HEMATOLOGIC: The patient denies anemia, easy bruising, or bleeding. LYMPHATIC: No swollen glands. MUSCULOSKELETAL: The patient has pain in his back but denies pain in his neck, shoulders, knees, or hips. No gout. NEUROLOGIC: No numbness, although he does have generalized weakness. Denies migraines, stroke, or seizures. PSYCH: The patient denies anxiety, insomnia, or depression.   PHYSICAL EXAMINATION:   GENERAL: The patient is acutely ill appearing, shaking in the bed.   VITAL SIGNS: Vital signs remarkable for a blood pressure of 110/59 with a heart rate of 74 and a respiratory rate of 22. Temperature 99.1.   HEENT: Normocephalic, atraumatic. Pupils equally round and reactive to light and accommodation. Extraocular movements are intact. Sclerae nonicteric. Conjunctivae are clear. Oropharynx is dry but clear.   NECK: Supple without JVD or bruits. No adenopathy or thyromegaly is noted.   LUNGS: Clear to auscultation and percussion without wheezes, rales, or rhonchi. No dullness.   CARDIAC: Regular rate and rhythm with normal S1 and S2. No significant rubs, murmurs, or gallops. PMI is nondisplaced. Chest wall is nontender.   ABDOMEN: Soft, nontender with normoactive bowel sounds. No organomegaly or masses were appreciated. Right-sided CVA tenderness was noted. No organomegaly or masses were noted. No hernias or bruits were present.   EXTREMITIES: Without clubbing, cyanosis, edema. Pulses were 2+ bilaterally.   SKIN: Warm and dry without rash or lesions.   NEUROLOGIC: Cranial nerves II through XII grossly intact. Deep tendon reflexes were symmetric. Motor and sensory examination is nonfocal.   PSYCH: Alert and oriented to person, place, and time. He was cooperative and used good judgment.  LABORATORY, DIAGNOSTIC, AND RADIOLOGICAL DATA: CBC revealed a white count of 12.2 with a hemoglobin of 13.1. Glucose was 135 with a BUN of 36 and a  creatinine of 1.65 with a sodium of 134, potassium of 3.3, and a GFR of 43. Urinalysis revealed 2+ blood with 3+ leukocyte esterase with WBCs in clumps and 3+ bacteria.   Chest x-ray revealed pulmonary crowding but was otherwise unremarkable.   CT of the abdomen revealed inflammatory changes of both kidneys worrisome for bilateral pyelonephritis.   ASSESSMENT:  1. Pyelonephritis.  2. Dehydration.  3. Acute renal insufficiency.  4. Acute hypokalemia.  5. Acute hyponatremia.  6. Benign hypertension, stable.  7. Remote history of tobacco abuse.   PLAN:  1. The patient will be admitted to the floor with IV fluids and potassium supplementation.  2. Will send blood and urine cultures at this time and begin IV antibiotics.  3. Will continue his blood pressure regimen for now.  4. Will use Zofran as needed for nausea or vomiting.  5. Follow-up routine labs in the morning.  6. No indication for Nephrology or Urology consult at this time unless the patient does not improve with conservative therapy.  7. Will follow his sugars with Accu-Cheks q.a.c. and at bedtime and add sliding scale insulin as needed.  8. Clear liquid diet for now.  9. Further treatment and evaluation will depend upon the patient's progress.   TOTAL TIME SPENT: 50 minutes.   ____________________________ Leonie Douglas Doy Hutching, MD jds:drc D: 12/17/2011 17:16:00 ET T: 12/18/2011 08:13:16 ET JOB#: [id number]  cc: Leonie Douglas. Doy Hutching, MD, <Dictator> Cinda Quest, NP Maythe Deramo Lennice Sites MD ELECTRONICALLY SIGNED 12/18/2011 12:49

## 2014-12-04 DIAGNOSIS — I739 Peripheral vascular disease, unspecified: Secondary | ICD-10-CM | POA: Diagnosis not present

## 2014-12-04 DIAGNOSIS — G4733 Obstructive sleep apnea (adult) (pediatric): Secondary | ICD-10-CM | POA: Diagnosis not present

## 2015-04-02 DIAGNOSIS — R0602 Shortness of breath: Secondary | ICD-10-CM | POA: Diagnosis not present

## 2015-04-02 DIAGNOSIS — I739 Peripheral vascular disease, unspecified: Secondary | ICD-10-CM | POA: Diagnosis not present

## 2015-04-02 DIAGNOSIS — G4733 Obstructive sleep apnea (adult) (pediatric): Secondary | ICD-10-CM | POA: Diagnosis not present

## 2015-04-02 DIAGNOSIS — I5042 Chronic combined systolic (congestive) and diastolic (congestive) heart failure: Secondary | ICD-10-CM | POA: Diagnosis not present

## 2015-04-07 DIAGNOSIS — G4733 Obstructive sleep apnea (adult) (pediatric): Secondary | ICD-10-CM | POA: Diagnosis not present

## 2015-04-07 DIAGNOSIS — R0602 Shortness of breath: Secondary | ICD-10-CM | POA: Diagnosis not present

## 2015-04-15 DIAGNOSIS — I739 Peripheral vascular disease, unspecified: Secondary | ICD-10-CM | POA: Diagnosis not present

## 2015-04-17 DIAGNOSIS — Z0001 Encounter for general adult medical examination with abnormal findings: Secondary | ICD-10-CM | POA: Diagnosis not present

## 2015-04-17 DIAGNOSIS — M064 Inflammatory polyarthropathy: Secondary | ICD-10-CM | POA: Diagnosis not present

## 2015-04-17 DIAGNOSIS — E291 Testicular hypofunction: Secondary | ICD-10-CM | POA: Diagnosis not present

## 2015-04-17 DIAGNOSIS — I10 Essential (primary) hypertension: Secondary | ICD-10-CM | POA: Diagnosis not present

## 2015-04-24 ENCOUNTER — Encounter: Payer: Self-pay | Admitting: Urology

## 2015-04-24 ENCOUNTER — Ambulatory Visit (INDEPENDENT_AMBULATORY_CARE_PROVIDER_SITE_OTHER): Payer: 59 | Admitting: Urology

## 2015-04-24 DIAGNOSIS — R300 Dysuria: Secondary | ICD-10-CM | POA: Diagnosis not present

## 2015-04-24 DIAGNOSIS — N3281 Overactive bladder: Secondary | ICD-10-CM | POA: Diagnosis not present

## 2015-04-24 DIAGNOSIS — R350 Frequency of micturition: Secondary | ICD-10-CM | POA: Diagnosis not present

## 2015-04-24 DIAGNOSIS — N400 Enlarged prostate without lower urinary tract symptoms: Secondary | ICD-10-CM | POA: Diagnosis not present

## 2015-04-24 LAB — BLADDER SCAN AMB NON-IMAGING: SCAN RESULT: 29

## 2015-04-24 MED ORDER — FESOTERODINE FUMARATE ER 8 MG PO TB24: 8.0000 mg | ORAL_TABLET | Freq: Every day | ORAL | Status: DC

## 2015-04-24 NOTE — Progress Notes (Signed)
04/24/2015 10:54 AM   Jimmy Norton. 23-Feb-1946 [id number]  Referring provider: Lavera Guise, MD [address] [place], [place] [address]  Chief Complaint  Patient presents with  . Benign Prostatic Hypertrophy    HPI: The patient is a 69 year old gentleman with a long-standing history of overactive bladder. He has been treated on at least 3 overactive bladder medications including the mybetriq. His biggest complaint is frequency during the day of every 45 minutes. He has nocturia 1. His PVR is 29. Get a cystoscopy by Dr. 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 He is not a good candidate for surgical operation due to his extreme obesity. Dr. Elnoria Howard and discussed the patient TUMT at his last visit. Patient's insurance will pay for it. The patient has been on Flomax before but it did not help him.   PMH: Past Medical History  Diagnosis Date  . Kidney problem   May  2013  . Gallstones 03/05/2013    Surgical History: Past Surgical History  Procedure Laterality Date  . Hernia repair    . Nose surgery    . Hand surgery Bilateral     Home Medications:    Medication List       This list is accurate as of: 04/24/15 10:54 AM.  Always use your most recent med list.               amLODipine 10 MG tablet  Commonly known as:  NORVASC  Take 10 mg by mouth daily.     amLODipine 10 MG tablet  Commonly known as:  NORVASC  Take 10 mg by mouth.     B-12 2500 MCG Tabs  Take 1 tablet by mouth daily.     cholecalciferol 400 UNITS Tabs tablet  Commonly known as:  VITAMIN D  Take 400 Units by mouth daily.     etodolac 300 MG capsule  Commonly known as:  LODINE  Take 300 mg by mouth every 8 (eight) hours.     fesoterodine 8 MG Tb24 tablet  Commonly known as:  TOVIAZ  Take 1 tablet (8 mg total) by mouth daily.     glucosamine-chondroitin 500-400 MG tablet  Take 1 tablet by mouth 2 (two) times daily.     glucose blood test strip    1 each by Other route as needed for other. Use as instructed     multivitamin with minerals tablet  Take 1 tablet by mouth daily.     pyridoxine 100 MG tablet  Commonly known as:  B-6  Take 100 mg by mouth daily.     sertraline 50 MG tablet  Commonly known as:  ZOLOFT  Take 50 mg by mouth daily.        Allergies:  Allergies  Allergen Reactions  . Bee Venom Anaphylaxis  . Penicillins Rash    Family History: No family history on file.  Social History:  reports that he has never smoked. He uses smokeless tobacco. He reports that he does not drink alcohol or use illicit drugs.  ROS: UROLOGY Frequent Urination?: Yes Hard to postpone urination?: Yes Burning/pain with urination?: No Get up at night to urinate?: No Leakage of urine?: Yes Urine stream starts and stops?: No Trouble starting stream?: No Do you have to strain to urinate?: No Blood in urine?: No Urinary tract infection?: No Sexually transmitted disease?: No Injury to kidneys or bladder?: No Painful intercourse?: No Weak stream?: No Erection problems?: Yes  Penile pain?: No  Gastrointestinal Nausea?: No Vomiting?: No Indigestion/heartburn?: No Diarrhea?: No Constipation?: No  Constitutional Fever: No Night sweats?: Yes Weight loss?: No Fatigue?: Yes  Skin Skin rash/lesions?: No Itching?: No  Eyes Blurred vision?: Yes Double vision?: Yes  Ears/Nose/Throat Sore throat?: No Sinus problems?: No  Hematologic/Lymphatic Swollen glands?: No Easy bruising?: No  Cardiovascular Leg swelling?: No Chest pain?: No  Respiratory Cough?: No Shortness of breath?: No  Endocrine Excessive thirst?: No  Musculoskeletal Back pain?: No Joint pain?: No  Neurological Headaches?: No Dizziness?: No  Psychologic Depression?: No Anxiety?: No  Physical Exam: There were no vitals taken for this visit.  Constitutional:  Alert and oriented, No acute distress. HEENT: [place] AT, moist mucus membranes.   Trachea midline, no masses. Cardiovascular: No clubbing, cyanosis, or edema. Respiratory: Normal respiratory effort, no increased work of breathing. GI: Abdomen is soft, nontender, nondistended, no abdominal masses GU: No CVA tenderness.  Skin: No rashes, bruises or suspicious lesions. Lymph: No cervical or inguinal adenopathy. Neurologic: Grossly intact, no focal deficits, moving all 4 extremities. Psychiatric: Normal mood and affect.  Laboratory Data: Lab Results  Component Value Date   WBC 12.4* 12/31/2013   HGB 14.9 12/30/2013   HCT 47.0 12/30/2013   MCV 86 12/30/2013   PLT 197 12/30/2013    Lab Results  Component Value Date   CREATININE 1.15 12/31/2013    No results found for: PSA  No results found for: TESTOSTERONE  Lab Results  Component Value Date   HGBA1C 6.0 12/18/2011    Urinalysis    Component Value Date/Time   COLORURINE Amber 12/29/2013 1342   APPEARANCEUR Cloudy 12/29/2013 1342   LABSPEC 1.018 12/29/2013 1342   PHURINE 5.0 12/29/2013 1342   GLUCOSEU Negative 12/29/2013 1342   HGBUR 3+ 12/29/2013 1342   BILIRUBINUR Negative 12/29/2013 1342   KETONESUR Negative 12/29/2013 1342   PROTEINUR 100 mg/dL 12/29/2013 1342   NITRITE Positive 12/29/2013 1342   LEUKOCYTESUR 3+ 12/29/2013 1342    Pertinent Imaging:  PVR: 29  Assessment & Plan:    In my discussion with the patient, I feel his symptoms are more a result of an overactive bladder than enlarged prostate. He has failed multiple overactive bladder medications. We will try him on Toviaz today. We'll also send a urine cytology due to his refractory overactive bladder symptoms. If the Lisbeth Ply does not seem to help, we will pursue urodynamic studies.  1. Overactive Bladder -Toviaz 78m daily -urine cytology -will perform UDS at next visit if the patient's symptoms remain refractory to medication.    Return in about 3 months (around 07/24/2015).  Nickie Retort, MD  [facility] [address] [place], [place] [address] [phone]

## 2015-04-27 LAB — URINALYSIS, COMPLETE
BILIRUBIN UA: NEGATIVE
GLUCOSE, UA: NEGATIVE
Ketones, UA: NEGATIVE
LEUKOCYTES UA: NEGATIVE
Nitrite, UA: NEGATIVE
PH UA: 5 (ref 5.0–7.5)
PROTEIN UA: NEGATIVE
SPEC GRAV UA: 1.025 (ref 1.005–1.030)
UUROB: 0.2 mg/dL (ref 0.2–1.0)

## 2015-04-27 LAB — MICROSCOPIC EXAMINATION
Bacteria, UA: NONE SEEN
RBC, UA: NONE SEEN /hpf (ref 0–?)
RENAL EPITHEL UA: NONE SEEN /HPF

## 2015-05-01 ENCOUNTER — Encounter: Payer: Self-pay | Admitting: Sports Medicine

## 2015-05-01 ENCOUNTER — Ambulatory Visit (INDEPENDENT_AMBULATORY_CARE_PROVIDER_SITE_OTHER): Payer: Medicare Other | Admitting: Sports Medicine

## 2015-05-01 VITALS — BP 147/84 | HR 73 | Resp 18

## 2015-05-01 DIAGNOSIS — M79672 Pain in left foot: Secondary | ICD-10-CM

## 2015-05-01 DIAGNOSIS — M1990 Unspecified osteoarthritis, unspecified site: Secondary | ICD-10-CM

## 2015-05-01 DIAGNOSIS — M779 Enthesopathy, unspecified: Secondary | ICD-10-CM | POA: Diagnosis not present

## 2015-05-01 DIAGNOSIS — M2041 Other hammer toe(s) (acquired), right foot: Secondary | ICD-10-CM

## 2015-05-01 DIAGNOSIS — M79671 Pain in right foot: Secondary | ICD-10-CM

## 2015-05-01 DIAGNOSIS — L84 Corns and callosities: Secondary | ICD-10-CM

## 2015-05-01 MED ORDER — TRIAMCINOLONE ACETONIDE 10 MG/ML IJ SUSP: 10.0000 mg | Freq: Once | INTRAMUSCULAR | Status: DC

## 2015-05-01 NOTE — Progress Notes (Signed)
Patient ID: Ledon Snare., male   DOB: 02-Dec-1945, 69 y.o.   MRN: [id number]  Subjective: Ethen A Kaven Cumbie. is a 69 y.o. male patient who returns to office for evaluation of bilateral foot pain. Patient complains of progressive pain especially over the last year in the Left ankle; hx of ankle fracture and arthritis; states injections help for 3-4 months at a time.Pain is now interferring with daily activities. Patient also states that when the callus builds up on his right foot it hurts and desires it to be trimmed. Patient denies any other pedal complaints.   Patient Active Problem List   Diagnosis Date Noted  . Gallstones 03/05/2013   Current Outpatient Prescriptions on File Prior to Visit  Medication Sig Dispense Refill  . amLODipine (NORVASC) 10 MG tablet Take 10 mg by mouth daily.    Marland Kitchen amLODipine (NORVASC) 10 MG tablet Take 10 mg by mouth.    . cholecalciferol (VITAMIN D) 400 UNITS TABS tablet Take 400 Units by mouth daily.    . Cyanocobalamin (B-12) 2500 MCG TABS Take 1 tablet by mouth daily.    Marland Kitchen etodolac (LODINE) 300 MG capsule Take 300 mg by mouth every 8 (eight) hours.    . fesoterodine (TOVIAZ) 8 MG TB24 tablet Take 1 tablet (8 mg total) by mouth daily. 30 tablet 11  . glucosamine-chondroitin 500-400 MG tablet Take 1 tablet by mouth 2 (two) times daily.    Marland Kitchen glucose blood test strip 1 each by Other route as needed for other. Use as instructed    . Multiple Vitamins-Minerals (MULTIVITAMIN WITH MINERALS) tablet Take 1 tablet by mouth daily.    Marland Kitchen pyridoxine (B-6) 100 MG tablet Take 100 mg by mouth daily.    . sertraline (ZOLOFT) 50 MG tablet Take 50 mg by mouth daily.     No current facility-administered medications on file prior to visit.   Allergies  Allergen Reactions  . Bee Venom Anaphylaxis  . Penicillins Rash     Objective:  General: Alert and oriented x3 in no acute distress  Dermatology: Hyperkeratotic lesion sub 5 on right with no signs of infection; No open  lesions bilateral lower extremities, no webspace macerations, no ecchymosis bilateral, all nails x 10 are well manicured.  Vascular: Dorsalis Pedis and Posterior Tibial pedal pulses 2/4, Capillary Fill Time 3 seconds, + pedal hair growth bilateral, no edema bilateral lower extremities, Temperature gradient within normal limits.  Neurology: Gross sensation intact via light touch bilateral.  Musculoskeletal: Tenderness to palpation to Left ankle and sinus tarsi, Pain to palpation of keratotic lesion right sub 5 with hammertoe deformity noted,No pain with calf compression bilateral. There is decreased ankle rom on left secondary to ankle injury, Subtalar joint range of motion is limited on left, all other joints within normal limits bilateral.  Gait: Antalgic gait   Assessment and Plan: Problem List Items Addressed This Visit    None    Visit Diagnoses    Foot pain, bilateral    -  Primary    Arthritis        Left traumatic     Relevant Medications    triamcinolone acetonide (KENALOG) 10 MG/ML injection 10 mg    Capsulitis        Left ankle/sinus tarsi    Relevant Medications    triamcinolone acetonide (KENALOG) 10 MG/ML injection 10 mg    Callus of foot        plantar 5th mtpj right    Hammertoe, right           -  Complete examination performed -Previous Xrays reviewed -Discussed treatement options in great detail for left ankle end stage arthritis; risks, benefits, alternatives -After verbal consent, injected 1cc lidocaine, 1cc marcaine, 0.5 dex and kenalog 10 into left sinus tarsi without complication -Dispensed trilock ankle brace for left -Debrided callus on right foot using sterile chisel blade without incident -Recommend good supportive shoes and inserts -Patient to return to office in 6 weeks or sooner if condition worsens.  Landis Martins, DPM

## 2015-05-05 ENCOUNTER — Other Ambulatory Visit: Payer: Self-pay | Admitting: Urology

## 2015-05-28 ENCOUNTER — Ambulatory Visit (INDEPENDENT_AMBULATORY_CARE_PROVIDER_SITE_OTHER): Payer: 59 | Admitting: Gastroenterology

## 2015-05-28 ENCOUNTER — Encounter (INDEPENDENT_AMBULATORY_CARE_PROVIDER_SITE_OTHER): Payer: Self-pay

## 2015-05-28 ENCOUNTER — Encounter: Payer: Self-pay | Admitting: Gastroenterology

## 2015-05-28 VITALS — BP 158/78 | HR 75 | Temp 98.7°F | Ht 71.0 in | Wt 332.0 lb

## 2015-05-28 DIAGNOSIS — R140 Abdominal distension (gaseous): Secondary | ICD-10-CM | POA: Diagnosis not present

## 2015-05-28 DIAGNOSIS — R143 Flatulence: Secondary | ICD-10-CM | POA: Diagnosis not present

## 2015-05-28 NOTE — Progress Notes (Signed)
Gastroenterology Consultation  Referring Provider:     Lavera Guise, MD Primary Care Physician:  Lavera Guise, MD Primary Gastroenterologist:  Dr. Allen Norris     Reason for Consultation:     Gas and bloating with increased bowel movements        HPI:   Jimmy Norton. is a 69 y.o. y/o male referred for consultation & management of gas and bloating with increased bowel movements by Dr. Humphrey Rolls, Timoteo Gaul, MD.  This patient reports that he has had a couple of months of increase in frequency of his bowel movements and states that the bowel movements are softer than usual. The patient also reports to have excessive gas. He states he sometimes walks around and will pass gas uncontrollably. The patient denies any unexplained weight loss. The patient is morbidly obese. The patient also reports that he had a colonoscopy a couple years ago with the only finding of diverticulosis. There is no report of any black stools or bloody stools. He also denies any abdominal pain associated with his symptoms. There is no report of any nausea or vomiting. The patient reports that he grew up very poor and was not able to drink milk as a child and when he got to [employer] they had copious amounts of milk and since then he has been drinking arch amounts of milk every day.  Past Medical History  Diagnosis Date  . Kidney problem   May  2013  . Gallstones 03/05/2013    Past Surgical History  Procedure Laterality Date  . Hernia repair    . Nose surgery    . Hand surgery Bilateral     Prior to Admission medications   Medication Sig Start Date End Date Taking? Authorizing Provider  amLODipine (NORVASC) 10 MG tablet Take 10 mg by mouth.   Yes Historical Provider, MD  cholecalciferol (VITAMIN D) 400 UNITS TABS tablet Take 400 Units by mouth daily.   Yes Historical Provider, MD  Cyanocobalamin (B-12) 2500 MCG TABS Take 1 tablet by mouth daily.   Yes Historical Provider, MD  fenofibrate 54 MG tablet Take 54 mg by mouth  daily.   Yes Historical Provider, MD  fesoterodine (TOVIAZ) 8 MG TB24 tablet Take 1 tablet (8 mg total) by mouth daily. 04/24/15  Yes Nickie Retort, MD  glucosamine-chondroitin 500-400 MG tablet Take 1 tablet by mouth 2 (two) times daily.   Yes Historical Provider, MD  glucose blood test strip 1 each by Other route as needed for other. Use as instructed   Yes Historical Provider, MD  hyoscyamine (LEVSIN, ANASPAZ) 0.125 MG tablet Take 0.125 mg by mouth every 4 (four) hours as needed.   Yes Historical Provider, MD  Multiple Vitamins-Minerals (MULTIVITAMIN WITH MINERALS) tablet Take 1 tablet by mouth daily.   Yes Historical Provider, MD  pyridoxine (B-6) 100 MG tablet Take 100 mg by mouth daily.   Yes Historical Provider, MD  sertraline (ZOLOFT) 50 MG tablet Take 50 mg by mouth daily.   Yes Historical Provider, MD  etodolac (LODINE) 300 MG capsule Take 300 mg by mouth every 8 (eight) hours.    Historical Provider, MD    Family History  Problem Relation Age of Onset  . COPD Mother   . Heart disease Mother   . Cancer Father      Social History  Substance Use Topics  . Smoking status: Never Smoker   . Smokeless tobacco: Current User  . Alcohol Use:  No    Allergies as of 05/28/2015 - Review Complete 05/01/2015  Allergen Reaction Noted  . Bee venom Anaphylaxis 04/24/2015  . Penicillins Rash 03/05/2013    Review of Systems:    All systems reviewed and negative except where noted in HPI.   Physical Exam:  BP 158/78 mmHg  Pulse 75  Temp(Src) 98.7 F (37.1 C) (Oral)  Ht 5\' 11"  (1.803 m)  Wt 332 lb (150.594 kg)  BMI 46.33 kg/m2 No LMP for male patient. Psych:  Alert and cooperative. Normal mood and affect. General:   Alert,  Well-developed, morbidly obese, well-nourished, pleasant and cooperative in NAD Head:  Normocephalic and atraumatic. Eyes:  Sclera clear, no icterus.   Conjunctiva pink. Ears:  Normal auditory acuity. Nose:  No deformity, discharge, or lesions. Mouth:  No  deformity or lesions,oropharynx pink & moist. Neck:  Supple; no masses or thyromegaly. Lungs:  Respirations even and unlabored.  Clear throughout to auscultation.   No wheezes, crackles, or rhonchi. No acute distress. Heart:  Regular rate and rhythm; no murmurs, clicks, rubs, or gallops. Abdomen:  Normal bowel sounds.  No bruits.  Soft, non-tender and non-distended without masses, hepatosplenomegaly. There was a large ventral hernia noted  No guarding or rebound tenderness.  Negative Carnett sign.   Rectal:  Deferred.  Msk:  Symmetrical without gross deformities.  Good, equal movement & strength bilaterally. Pulses:  Normal pulses noted. Extremities:  No clubbing or edema.  No cyanosis. Neurologic:  Alert and oriented x3;  grossly normal neurologically. Skin:  Intact without significant lesions or rashes.  No jaundice. Lymph Nodes:  No significant cervical adenopathy. Psych:  Alert and cooperative. Normal mood and affect.  Imaging Studies: No results found.  Assessment and Plan:   Jimmy Norton. is a 69 y.o. y/o male who comes in with excessive gas and bloating. The patient reports a large amount of daily intake of dairy products including milk. He also states that he looks his ice cream. The patient has been told to avoid milk products and dairy products for 7 days to see if his symptoms improve. The patient does not need a colonoscopy at the present time since he recently had a colonoscopy and has no symptoms of colon pathology. The patient has been explained the plan and agrees with it.   Note: This dictation was prepared with Dragon dictation along with smaller phrase technology. Any transcriptional errors that result from this process are unintentional.

## 2015-06-12 ENCOUNTER — Encounter: Payer: Self-pay | Admitting: Sports Medicine

## 2015-06-12 ENCOUNTER — Ambulatory Visit (INDEPENDENT_AMBULATORY_CARE_PROVIDER_SITE_OTHER): Payer: Medicare Other | Admitting: Sports Medicine

## 2015-06-12 VITALS — BP 168/95 | HR 73

## 2015-06-12 DIAGNOSIS — M2041 Other hammer toe(s) (acquired), right foot: Secondary | ICD-10-CM | POA: Diagnosis not present

## 2015-06-12 DIAGNOSIS — M79671 Pain in right foot: Secondary | ICD-10-CM

## 2015-06-12 DIAGNOSIS — L84 Corns and callosities: Secondary | ICD-10-CM

## 2015-06-12 DIAGNOSIS — M1990 Unspecified osteoarthritis, unspecified site: Secondary | ICD-10-CM | POA: Diagnosis not present

## 2015-06-12 DIAGNOSIS — M79672 Pain in left foot: Secondary | ICD-10-CM

## 2015-06-12 NOTE — Progress Notes (Signed)
Patient ID: Jimmy Snare., male   DOB: August 15, 1945, 69 y.o.   MRN: [id number]  Subjective: Jimmy A Wryder Keizer. is a 69 y.o. male patient who returns to office for evaluation of bilateral foot pain. Patient states that the left ankle and foot is feel so much better after injection and using trilock brace. Patient states what is bothering him most is the right foot at callus skin and the ankle occasionally. Patient denies any other pedal complaints.   Patient Active Problem List   Diagnosis Date Noted  . Gallstones 03/05/2013   Current Outpatient Prescriptions on File Prior to Visit  Medication Sig Dispense Refill  . amLODipine (NORVASC) 10 MG tablet Take 10 mg by mouth.    . cholecalciferol (VITAMIN D) 400 UNITS TABS tablet Take 400 Units by mouth daily.    . Cyanocobalamin (B-12) 2500 MCG TABS Take 1 tablet by mouth daily.    Marland Kitchen etodolac (LODINE) 300 MG capsule Take 300 mg by mouth every 8 (eight) hours.    . fenofibrate 54 MG tablet Take 54 mg by mouth daily.    . fesoterodine (TOVIAZ) 8 MG TB24 tablet Take 1 tablet (8 mg total) by mouth daily. 30 tablet 11  . glucosamine-chondroitin 500-400 MG tablet Take 1 tablet by mouth 2 (two) times daily.    Marland Kitchen glucose blood test strip 1 each by Other route as needed for other. Use as instructed    . hyoscyamine (LEVSIN, ANASPAZ) 0.125 MG tablet Take 0.125 mg by mouth every 4 (four) hours as needed.    . Multiple Vitamins-Minerals (MULTIVITAMIN WITH MINERALS) tablet Take 1 tablet by mouth daily.    Marland Kitchen pyridoxine (B-6) 100 MG tablet Take 100 mg by mouth daily.    . sertraline (ZOLOFT) 50 MG tablet Take 50 mg by mouth daily.     Current Facility-Administered Medications on File Prior to Visit  Medication Dose Route Frequency Provider Last Rate Last Dose  . triamcinolone acetonide (KENALOG) 10 MG/ML injection 10 mg  10 mg Other Once Landis Martins, DPM       Allergies  Allergen Reactions  . Bee Venom Anaphylaxis  . Penicillins Rash     Objective:   General: Alert and oriented x3 in no acute distress  Dermatology: Skin is dry to plantar surfaces of feet, Hyperkeratotic lesion sub 1& 5 on right with no signs of infection; No open lesions bilateral lower extremities, no webspace macerations, no ecchymosis bilateral, all nails x 10 are well manicured.  Vascular: Dorsalis Pedis and Posterior Tibial pedal pulses 2/4, Capillary Fill Time 3 seconds, + pedal hair growth bilateral, no edema bilateral lower extremities, Temperature gradient within normal limits.  Neurology: Johney Maine sensation intact via light touch bilateral.  Musculoskeletal: No Tenderness to palpation to Left ankle. No reproducible tenderness to right ankle however subjective occasional pain noted. There is pain to palpation of keratotic lesions right sub 1 and 5 with hammertoe deformity noted, No pain with calf compression bilateral. There is decreased ankle rom on left secondary to ankle injury/severe arthritis, Subtalar joint range of motion is limited on left>right, all other joints within normal limits bilateral.  Gait: Wide based, Mildly Antalgic gait on right foot   Assessment and Plan: Problem List Items Addressed This Visit    None    Visit Diagnoses    Callus of foot    -  Primary    Sub 1 and 5 on right    Hammertoe, right  Arthritis        Foot pain, bilateral           -Complete examination performed -Debrided callus x2 on right foot using sterile chisel blade without incident. -Advised patient to purchase revitaderm for dry callus skin.  -Recommend good supportive shoes and inserts. Continue with trilock on left and dispensed trilock for right to use daily.  -Patient to return to office as needed or sooner if condition worsens.  Landis Martins, DPM

## 2015-07-17 DIAGNOSIS — F331 Major depressive disorder, recurrent, moderate: Secondary | ICD-10-CM | POA: Diagnosis not present

## 2015-07-17 DIAGNOSIS — Z91018 Allergy to other foods: Secondary | ICD-10-CM | POA: Diagnosis not present

## 2015-07-17 DIAGNOSIS — I10 Essential (primary) hypertension: Secondary | ICD-10-CM | POA: Diagnosis not present

## 2015-07-17 DIAGNOSIS — I739 Peripheral vascular disease, unspecified: Secondary | ICD-10-CM | POA: Diagnosis not present

## 2015-07-17 DIAGNOSIS — K580 Irritable bowel syndrome with diarrhea: Secondary | ICD-10-CM | POA: Diagnosis not present

## 2015-07-17 DIAGNOSIS — E291 Testicular hypofunction: Secondary | ICD-10-CM | POA: Diagnosis not present

## 2015-07-24 ENCOUNTER — Encounter: Payer: Self-pay | Admitting: Urology

## 2015-07-24 ENCOUNTER — Ambulatory Visit (INDEPENDENT_AMBULATORY_CARE_PROVIDER_SITE_OTHER): Payer: 59 | Admitting: Urology

## 2015-07-24 VITALS — BP 146/77 | HR 72 | Ht 70.0 in | Wt 334.3 lb

## 2015-07-24 DIAGNOSIS — E291 Testicular hypofunction: Secondary | ICD-10-CM

## 2015-07-24 DIAGNOSIS — N3281 Overactive bladder: Secondary | ICD-10-CM | POA: Diagnosis not present

## 2015-07-24 LAB — URINALYSIS, COMPLETE
Bilirubin, UA: NEGATIVE
Glucose, UA: NEGATIVE
Ketones, UA: NEGATIVE
LEUKOCYTES UA: NEGATIVE
Nitrite, UA: NEGATIVE
PH UA: 5.5 (ref 5.0–7.5)
PROTEIN UA: NEGATIVE
Specific Gravity, UA: 1.02 (ref 1.005–1.030)
Urobilinogen, Ur: 1 mg/dL (ref 0.2–1.0)

## 2015-07-24 LAB — MICROSCOPIC EXAMINATION
Bacteria, UA: NONE SEEN
Epithelial Cells (non renal): NONE SEEN /hpf (ref 0–10)
WBC UA: NONE SEEN /HPF (ref 0–?)

## 2015-07-24 LAB — BLADDER SCAN AMB NON-IMAGING: Scan Result: 25

## 2015-07-24 NOTE — Progress Notes (Signed)
03/2015 - PSA 0.4ng/mL checked by PCP Leretha Pol, MD at [facility]

## 2015-07-24 NOTE — Progress Notes (Signed)
Bladder Scan Patient void: 25 ml Performed By: Larna Daughters

## 2015-07-24 NOTE — Progress Notes (Signed)
07/24/2015 9:32 AM   Antony Odea Janeece Agee. December 26, 1945 [id number]  Referring provider: Lavera Guise, MD [address] [place], [place] [address]  Chief Complaint  Patient presents with  . Follow-up    overactive bladder    HPI: The patient is a 69 year old gentleman with a long-standing history of overactive bladder. He has been treated on at least 3 overactive bladder medications including mybetriq. His biggest complaint is frequency during the day of every 45 minutes. He has nocturia 1. His PVR is 29. He had a cystoscopy by Dr. 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 He is not a good candidate for surgical operation due to his extreme obesity. Dr. Elnoria Howard discussed TUMT with the patient at his last visit. Patient's insurance will not pay for it. The patient has been on Flomax before but it did not help him.   Interval History: The patient returns for review of his symptoms after being on Toviaz for 1 month. As last visit, his PVR is 29. His urine cytology was negative for malignant cells or dysplasia.  The patient again notes no improvement in his urinary symptoms with Toviaz. His biggest complaints are frequency every 40 minutes, urgency, and nocturia 5. His I PSS is 26/6. The score was mostly driven by his frequency, urgency, and nocturia. His PVR today is 25.  His PSA is 0.4  He is also totally has low testosterone. He does note having weakness and fatigue. He is interested in pursuing this further.   PMH: Past Medical History  Diagnosis Date  . Kidney problem   May  2013  . Gallstones 03/05/2013    Surgical History: Past Surgical History  Procedure Laterality Date  . Hernia repair    . Nose surgery    . Hand surgery Bilateral     Home Medications:    Medication List       This list is accurate as of: 07/24/15  9:32 AM.  Always use your most recent med list.               amLODipine 10 MG tablet  Commonly known as:   NORVASC  Take 10 mg by mouth.     B-12 2500 MCG Tabs  Take 1 tablet by mouth daily.     cholecalciferol 400 units Tabs tablet  Commonly known as:  VITAMIN D  Take 400 Units by mouth daily.     etodolac 300 MG capsule  Commonly known as:  LODINE  Take 300 mg by mouth every 8 (eight) hours. Reported on 07/24/2015     fenofibrate 54 MG tablet  Take 54 mg by mouth daily.     fesoterodine 8 MG Tb24 tablet  Commonly known as:  TOVIAZ  Take 1 tablet (8 mg total) by mouth daily.     glucosamine-chondroitin 500-400 MG tablet  Take 1 tablet by mouth 2 (two) times daily.     glucose blood test strip  1 each by Other route as needed for other. Use as instructed     hyoscyamine 0.125 MG tablet  Commonly known as:  LEVSIN, ANASPAZ  Take 0.125 mg by mouth every 4 (four) hours as needed.     multivitamin with minerals tablet  Take 1 tablet by mouth daily.     pyridoxine 100 MG tablet  Commonly known as:  B-6  Take 100 mg by mouth daily.     sertraline 50 MG tablet  Commonly known as:  ZOLOFT  Take 50 mg by mouth daily.        Allergies:  Allergies  Allergen Reactions  . Bee Venom Anaphylaxis  . Penicillins Rash    Family History: Family History  Problem Relation Age of Onset  . COPD Mother   . Heart disease Mother   . Cancer Father     Social History:  reports that he has never smoked. He uses smokeless tobacco. He reports that he does not drink alcohol or use illicit drugs.  ROS: UROLOGY Frequent Urination?: Yes Hard to postpone urination?: Yes Burning/pain with urination?: Yes Get up at night to urinate?: Yes Leakage of urine?: Yes Urine stream starts and stops?: Yes Trouble starting stream?: Yes Do you have to strain to urinate?: No Blood in urine?: No Urinary tract infection?: No Sexually transmitted disease?: No Injury to kidneys or bladder?: No Painful intercourse?: No Weak stream?: Yes Erection problems?: Yes Penile pain?:  Yes  Gastrointestinal Nausea?: No Vomiting?: No Indigestion/heartburn?: No Diarrhea?: No Constipation?: No  Constitutional Fever: No Night sweats?: No Weight loss?: No Fatigue?: No  Skin Skin rash/lesions?: No Itching?: No  Eyes Blurred vision?: No Double vision?: No  Ears/Nose/Throat Sore throat?: No Sinus problems?: No  Hematologic/Lymphatic Swollen glands?: No Easy bruising?: No  Cardiovascular Leg swelling?: No Chest pain?: No  Respiratory Cough?: No Shortness of breath?: No  Endocrine Excessive thirst?: No  Musculoskeletal Back pain?: No Joint pain?: No  Neurological Headaches?: No Dizziness?: No  Psychologic Depression?: Yes Anxiety?: Yes  Physical Exam: BP 146/77 mmHg  Pulse 72  Ht 5\' 10"  (1.778 m)  Wt 334 lb 4.8 oz (151.637 kg)  BMI 47.97 kg/m2  Constitutional:  Alert and oriented, No acute distress. HEENT: [place] AT, moist mucus membranes.  Trachea midline, no masses. Cardiovascular: No clubbing, cyanosis, or edema. Respiratory: Normal respiratory effort, no increased work of breathing. GI: Abdomen is soft, nontender, nondistended, no abdominal masses GU: No CVA tenderness.  Skin: No rashes, bruises or suspicious lesions. Lymph: No cervical or inguinal adenopathy. Neurologic: Grossly intact, no focal deficits, moving all 4 extremities. Psychiatric: Normal mood and affect.  Laboratory Data: Lab Results  Component Value Date   WBC 12.4* 12/31/2013   HGB 14.9 12/30/2013   HCT 47.0 12/30/2013   MCV 86 12/30/2013   PLT 197 12/30/2013    Lab Results  Component Value Date   CREATININE 1.15 12/31/2013    No results found for: PSA  No results found for: TESTOSTERONE  Lab Results  Component Value Date   HGBA1C 6.0 12/18/2011    Urinalysis    Component Value Date/Time   COLORURINE Amber 12/29/2013 1342   APPEARANCEUR Cloudy 12/29/2013 1342   LABSPEC 1.018 12/29/2013 1342   PHURINE 5.0 12/29/2013 1342   GLUCOSEU Negative  04/24/2015 1025   GLUCOSEU Negative 12/29/2013 1342   HGBUR 3+ 12/29/2013 1342   BILIRUBINUR Negative 04/24/2015 1025   BILIRUBINUR Negative 12/29/2013 1342   KETONESUR Negative 12/29/2013 1342   PROTEINUR 100 mg/dL 12/29/2013 1342   NITRITE Negative 04/24/2015 1025   NITRITE Positive 12/29/2013 1342   LEUKOCYTESUR Negative 04/24/2015 1025   LEUKOCYTESUR 3+ 12/29/2013 1342   PVR: 25  Negative urine cytology    Assessment & Plan:    1. Refractory Overactive Bladder The patient has refractory overactive bladder. We'll arrange for the patient to undergo urodynamic studies [facility]. He'll follow-up in one month after that.  2. Hypogonadism The patient has low testosterone drawn at his PCPs office. We will recheck his  total and free testosterone as well as pre-testosterone replacement therapy labs. He already has a recent PSA 0.4. He will have these labs drawn before 9 AM next week.  Nickie Retort, MD  [facility] [address] [place], [place] [address] [phone]

## 2015-07-31 ENCOUNTER — Other Ambulatory Visit: Payer: PPO

## 2015-07-31 DIAGNOSIS — Z79899 Other long term (current) drug therapy: Secondary | ICD-10-CM

## 2015-07-31 DIAGNOSIS — E291 Testicular hypofunction: Secondary | ICD-10-CM | POA: Diagnosis not present

## 2015-08-01 LAB — LIPID PANEL
Chol/HDL Ratio: 4.4 ratio units (ref 0.0–5.0)
Cholesterol, Total: 157 mg/dL (ref 100–199)
HDL: 36 mg/dL — AB (ref >39)
LDL CALC: 69 mg/dL (ref 0–99)
Triglycerides: 262 mg/dL — ABNORMAL HIGH (ref 0–149)
VLDL CHOLESTEROL CAL: 52 mg/dL — AB (ref 5–40)

## 2015-08-01 LAB — CBC
HEMATOCRIT: 45.7 % (ref 37.5–51.0)
HEMOGLOBIN: 15.5 g/dL (ref 12.6–17.7)
MCH: 28.7 pg (ref 26.6–33.0)
MCHC: 33.9 g/dL (ref 31.5–35.7)
MCV: 85 fL (ref 79–97)
Platelets: 292 10*3/uL (ref 150–379)
RBC: 5.4 x10E6/uL (ref 4.14–5.80)
RDW: 14.1 % (ref 12.3–15.4)
WBC: 7.7 10*3/uL (ref 3.4–10.8)

## 2015-08-01 LAB — HEPATIC FUNCTION PANEL
ALT: 34 IU/L (ref 0–44)
AST: 30 IU/L (ref 0–40)
Albumin: 4.2 g/dL (ref 3.6–4.8)
Alkaline Phosphatase: 99 IU/L (ref 39–117)
BILIRUBIN TOTAL: 0.4 mg/dL (ref 0.0–1.2)
BILIRUBIN, DIRECT: 0.13 mg/dL (ref 0.00–0.40)
TOTAL PROTEIN: 6.6 g/dL (ref 6.0–8.5)

## 2015-08-01 LAB — PROLACTIN: PROLACTIN: 7.8 ng/mL (ref 4.0–15.2)

## 2015-08-01 LAB — TSH: TSH: 2.6 u[IU]/mL (ref 0.450–4.500)

## 2015-08-01 LAB — FSH/LH
FSH: 2.3 m[IU]/mL (ref 1.5–12.4)
LH: 2.8 m[IU]/mL (ref 1.7–8.6)

## 2015-08-02 LAB — TESTOSTERONE,FREE AND TOTAL: Testosterone, Free: 4 pg/mL — ABNORMAL LOW (ref 6.6–18.1)

## 2015-08-06 ENCOUNTER — Encounter: Payer: Self-pay | Admitting: Urology

## 2015-08-10 ENCOUNTER — Encounter: Payer: Self-pay | Admitting: Urology

## 2015-09-04 DIAGNOSIS — R3915 Urgency of urination: Secondary | ICD-10-CM | POA: Diagnosis not present

## 2015-09-04 DIAGNOSIS — R350 Frequency of micturition: Secondary | ICD-10-CM | POA: Diagnosis not present

## 2015-09-11 ENCOUNTER — Ambulatory Visit (INDEPENDENT_AMBULATORY_CARE_PROVIDER_SITE_OTHER): Payer: PPO | Admitting: Urology

## 2015-09-11 ENCOUNTER — Telehealth: Payer: Self-pay | Admitting: *Deleted

## 2015-09-11 ENCOUNTER — Encounter: Payer: Self-pay | Admitting: Urology

## 2015-09-11 VITALS — BP 155/82 | HR 72 | Ht 71.0 in | Wt 329.8 lb

## 2015-09-11 DIAGNOSIS — E291 Testicular hypofunction: Secondary | ICD-10-CM

## 2015-09-11 DIAGNOSIS — N3281 Overactive bladder: Secondary | ICD-10-CM

## 2015-09-11 NOTE — Telephone Encounter (Signed)
LMOM for patient to call me back about setting up his appointments for PTNS.

## 2015-09-11 NOTE — Progress Notes (Signed)
09/11/2015 9:42 AM   Antony Odea Janeece Agee. 1946/05/21 [id number]  Referring provider: Lavera Guise, MD [address] [place], [place] [address]  Chief Complaint  Patient presents with  . Results    uds    HPI: The patient is a 70 year old gentleman with a long-standing history of overactive bladder. He has been treated on at least 3 overactive bladder medications including mybetriq. His biggest complaint is frequency during the day of every 45 minutes. He has nocturia 1. His PVR is 29. He had a cystoscopy by Dr. 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 He is not a good candidate for surgical operation due to his extreme obesity. Dr. Elnoria Howard discussed TUMT with the patient at his last visit. Patient's insurance will not pay for it. The patient has been on Flomax before but it did not help him.   Interval History: The patient returns for review of his symptoms after being on Toviaz for 1 month. As last visit, his PVR is 29. His urine cytology was negative for malignant cells or dysplasia.  The patient again notes no improvement in his urinary symptoms with Toviaz. His biggest complaints are frequency every 40 minutes, urgency, and nocturia 5. His I PSS is 26/6. The score was mostly driven by his frequency, urgency, and nocturia. His PVR today is 25.  His PSA is 0.4  He is also totally has low testosterone. He does note having weakness and fatigue. He is interested in pursuing this further.   Laboratory 17 interval history: The patient returns to follow-up on his urodynamic study results. The patient was noted a positive instability with involuntary voiding. He was able to void voluntarily ago. He did have a question of diverticular illness bladder. His PVR was negligible. His maximum flow rate was 14 mi./s. His detrusor pressure at maximum flow was 33 cm of water again of severe urinary leakage from an unstable bladder contraction. He is very bothered  by this affects her life.  Regards to his low testosterone, unfortunately on rechecking his labs he only had a free testosterone available which was 4. His total testosterone was not reported by the lab. He states that he is not bothered by his fatigue or decreased libido at this time. He is most concerned about his urinary symptoms like to address that prior to addressing his low testosterone  PMH: Past Medical History  Diagnosis Date  . Kidney problem   May  2013  . Gallstones 03/05/2013    Surgical History: Past Surgical History  Procedure Laterality Date  . Hernia repair    . Nose surgery    . Hand surgery Bilateral     Home Medications:    Medication List       This list is accurate as of: 09/11/15  9:42 AM.  Always use your most recent med list.               amLODipine 10 MG tablet  Commonly known as:  NORVASC  Take 10 mg by mouth. Reported on 09/11/2015     B-12 2500 MCG Tabs  Take 1 tablet by mouth daily.     cholecalciferol 400 units Tabs tablet  Commonly known as:  VITAMIN D  Take 400 Units by mouth daily.     cilostazol 50 MG tablet  Commonly known as:  PLETAL  Take 50 mg by mouth 2 (two) times daily.     glucosamine-chondroitin 500-400 MG tablet  Take  1 tablet by mouth 2 (two) times daily.     glucose blood test strip  1 each by Other route as needed for other. Reported on 09/11/2015     hyoscyamine 0.125 MG tablet  Commonly known as:  LEVSIN, ANASPAZ  Take 0.125 mg by mouth every 4 (four) hours as needed.     multivitamin with minerals tablet  Take 1 tablet by mouth daily. Reported on 09/11/2015     pyridoxine 100 MG tablet  Commonly known as:  B-6  Take 100 mg by mouth daily.     sertraline 50 MG tablet  Commonly known as:  ZOLOFT  Take 50 mg by mouth daily.        Allergies:  Allergies  Allergen Reactions  . Bee Venom Anaphylaxis  . Penicillins Rash    Family History: Family History  Problem Relation Age of Onset  . COPD Mother    . Heart disease Mother   . Cancer Father     Social History:  reports that he has never smoked. His smokeless tobacco use includes Chew. He reports that he does not drink alcohol or use illicit drugs.  ROS: UROLOGY Frequent Urination?: Yes Hard to postpone urination?: Yes Burning/pain with urination?: Yes Get up at night to urinate?: Yes Leakage of urine?: Yes Urine stream starts and stops?: Yes Trouble starting stream?: No Do you have to strain to urinate?: No Blood in urine?: No Urinary tract infection?: No Sexually transmitted disease?: No Injury to kidneys or bladder?: No Painful intercourse?: No Weak stream?: No Erection problems?: Yes Penile pain?: Yes  Gastrointestinal Nausea?: Yes Vomiting?: No Indigestion/heartburn?: No Diarrhea?: Yes Constipation?: Yes  Constitutional Fever: No Night sweats?: No Weight loss?: No Fatigue?: No  Skin Skin rash/lesions?: Yes Itching?: Yes  Eyes Blurred vision?: Yes Double vision?: Yes  Ears/Nose/Throat Sore throat?: Yes Sinus problems?: Yes  Hematologic/Lymphatic Swollen glands?: No Easy bruising?: Yes  Cardiovascular Leg swelling?: Yes Chest pain?: Yes  Respiratory Cough?: Yes Shortness of breath?: Yes  Endocrine Excessive thirst?: Yes  Musculoskeletal Back pain?: Yes Joint pain?: Yes  Neurological Headaches?: Yes Dizziness?: Yes  Psychologic Depression?: Yes Anxiety?: Yes  Physical Exam: BP 155/82 mmHg  Pulse 72  Ht 5\' 11"  (1.803 m)  Wt 329 lb 12.8 oz (149.596 kg)  BMI 46.02 kg/m2  Constitutional:  Alert and oriented, No acute distress. HEENT: [place] AT, moist mucus membranes.  Trachea midline, no masses. Cardiovascular: No clubbing, cyanosis, or edema. Respiratory: Normal respiratory effort, no increased work of breathing. GI: Abdomen is soft, nontender, nondistended, no abdominal masses GU: No CVA tenderness.  Skin: No rashes, bruises or suspicious lesions. Lymph: No cervical or  inguinal adenopathy. Neurologic: Grossly intact, no focal deficits, moving all 4 extremities. Psychiatric: Normal mood and affect.  Laboratory Data: Lab Results  Component Value Date   WBC 7.7 07/31/2015   HGB 14.9 12/30/2013   HCT 45.7 07/31/2015   MCV 85 07/31/2015   PLT 292 07/31/2015    Lab Results  Component Value Date   CREATININE 1.15 12/31/2013    No results found for: PSA  No results found for: TESTOSTERONE  Lab Results  Component Value Date   HGBA1C 6.0 12/18/2011    Urinalysis    Component Value Date/Time   COLORURINE Amber 12/29/2013 1342   APPEARANCEUR Cloudy 12/29/2013 1342   LABSPEC 1.018 12/29/2013 1342   PHURINE 5.0 12/29/2013 1342   GLUCOSEU Negative 07/24/2015 0906   GLUCOSEU Negative 12/29/2013 1342   HGBUR 3+ 12/29/2013 1342  BILIRUBINUR Negative 07/24/2015 0906   BILIRUBINUR Negative 12/29/2013 1342   KETONESUR Negative 12/29/2013 1342   PROTEINUR 100 mg/dL 12/29/2013 1342   NITRITE Negative 07/24/2015 0906   NITRITE Positive 12/29/2013 1342   LEUKOCYTESUR Negative 07/24/2015 0906   LEUKOCYTESUR 3+ 12/29/2013 1342      Assessment & Plan:  At  I had a long discussion with the patient discussing his urodynamic study results which showed positive instability with involuntary voiding. He has failed multiple oral treatments for overactive bladder and still has urgency with urge incontinence. I discussed treatment options with him which include PTNS (percutaneous tibial nerve stimulation), Botox injection to the bladder, and InterStim. I discussed the least invasive was PTNS and that there was a 60% response rate among patients for urgency and urgent incontinence. He elected to proceed with PTNS. We will have him follow-up with me after completing a 12 week cycle.  1. Overactive Bladder -PTNS weekly for 12 weeks -Follow-up after above  2. Hypogonadism The patient would like to address his urinary symptoms prior to addressing his  hypogonadism. We will need to repeat his morning testosterone level as we did not get a total level report from the lab.   Return for PTNS x 12 weeks with follow up with me after.  Nickie Retort, MD  [facility] [address], [place], [place] [address] [phone]

## 2015-09-14 NOTE — Telephone Encounter (Signed)
LMOM for patient to call back regarding appointments. Home phone no answer, no machine LMOM on cell.

## 2015-09-14 NOTE — Telephone Encounter (Signed)
Patient called back and we discussed PTNS and the appointment schedule. Patient to start this Wednesday at 8:30 am. Patient ok with plan.

## 2015-09-16 ENCOUNTER — Ambulatory Visit (INDEPENDENT_AMBULATORY_CARE_PROVIDER_SITE_OTHER): Payer: PPO | Admitting: Urology

## 2015-09-16 ENCOUNTER — Encounter: Payer: Self-pay | Admitting: Urology

## 2015-09-16 VITALS — BP 143/78 | HR 68 | Ht 71.0 in | Wt 332.2 lb

## 2015-09-16 DIAGNOSIS — R350 Frequency of micturition: Secondary | ICD-10-CM

## 2015-09-16 DIAGNOSIS — IMO0001 Reserved for inherently not codable concepts without codable children: Secondary | ICD-10-CM

## 2015-09-16 LAB — PTNS-PERCUTANEOUS TIBIAL NERVE STIMULATION: Scan Result: 3

## 2015-09-16 NOTE — Progress Notes (Signed)
Chief Complaint:  Chief Complaint  Patient presents with  . Over Active Bladder    PTNS     HPI: Patient is a 70 year old Caucasian male who presents today for his 1st PTNS treatment.    Background history The patient is  with a long-standing history of overactive bladder. He has been treated on at least 3 overactive bladder medications including Myrbetriq. His biggest complaint is frequency during the day of every 45 minutes. He has nocturia 1. His PVR is 29. He had a cystoscopy by Dr. 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 He is not a good candidate for surgical operation due to his extreme obesity. Dr. Elnoria Howard discussed TUMT with the patient at his last visit. Patient's insurance will not pay for it. The patient has been on Flomax before but it did not help him.   His baseline urinary symptoms consist of 15 day time voids, 1 night time void and 1 incontinence episode daily.  He has a strong urge to void.  He has 1 beverage with caffeine daily.  Previous Therapy: Patient has been on Toviaz, Myrbetriq and Flomax without benefit.    BP 143/78 mmHg  Pulse 68  Ht 5\' 11"  (1.803 m)  Wt 332 lb 3.2 oz (150.685 kg)  BMI 46.35 kg/m2  Patient did not have contraindications present for PTNS, such as a pacemaker, implantable defibrillator, history of abnormal bleeding and history of neuropathies or nerve damage  Discussed with patient possible complications of procedure, such as discomfort, bleeding at insertion/stimulation site, procedure consent signed  Patient goals: Patient would like to reduce his trips to the bathroom.  PTNS treatment: The needle electrode was inserted into the lower, inner aspect of the patient's right leg. The surface electrode was placed on the inside arch of the foot on the treatment leg. The lead set was connected to the stimulator and the needle electrode clip was connected to the needle electrode. The stimulator that  produces an adjustable electrical pulse that travels to the sacral nerve plexus via the tibial nerve was increased to 3 until a patient received a sensory response.    Treatment Plan:  Patient tolerated the stimulation for the thirty minutes and the electrode was removed without difficulty.  He will return to the clinic in one week for his # 2 PTNS treatment.  He will follow up with Dr. Pilar Jarvis after he has completed the twelve week cycle.

## 2015-09-18 DIAGNOSIS — IMO0001 Reserved for inherently not codable concepts without codable children: Secondary | ICD-10-CM | POA: Insufficient documentation

## 2015-09-23 ENCOUNTER — Encounter: Payer: Self-pay | Admitting: Urology

## 2015-09-23 ENCOUNTER — Ambulatory Visit (INDEPENDENT_AMBULATORY_CARE_PROVIDER_SITE_OTHER): Payer: PPO | Admitting: Urology

## 2015-09-23 VITALS — BP 144/75 | HR 76 | Ht 70.0 in | Wt 334.3 lb

## 2015-09-23 DIAGNOSIS — R350 Frequency of micturition: Secondary | ICD-10-CM

## 2015-09-23 DIAGNOSIS — N3281 Overactive bladder: Secondary | ICD-10-CM | POA: Insufficient documentation

## 2015-09-23 DIAGNOSIS — IMO0001 Reserved for inherently not codable concepts without codable children: Secondary | ICD-10-CM

## 2015-09-23 LAB — PTNS-PERCUTANEOUS TIBIAL NERVE STIMULATION: SCAN RESULT: 3

## 2015-09-23 NOTE — Progress Notes (Signed)
Chief Complaint:  Chief Complaint  Patient presents with  . Over Active Bladder    PTNS     HPI: Patient is a 70 year old Caucasian male who presents today for his 2nd PTNS treatment.    Background history The patient is  with a long-standing history of overactive bladder. He has been treated on at least 3 overactive bladder medications including Myrbetriq. His biggest complaint is frequency during the day of every 45 minutes. He has nocturia 1. His PVR is 29. He had a cystoscopy by Dr. 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 He is not a good candidate for surgical operation due to his extreme obesity. Dr. Elnoria Howard discussed TUMT with the patient at his last visit. Patient's insurance will not pay for it. The patient has been on Flomax before but it did not help him.   His baseline urinary symptoms consist of 10 day time voids, 1 night time void and 2 incontinence episode daily.  He has a strong urge to void.  He has 1 beverage with caffeine daily.  Previous Therapy: Patient has been on Toviaz, Myrbetriq and Flomax without benefit.    BP 144/75 mmHg  Pulse 76  Ht 5\' 10"  (1.778 m)  Wt 334 lb 4.8 oz (151.637 kg)  BMI 47.97 kg/m2  Patient did not have contraindications present for PTNS, such as a pacemaker, implantable defibrillator, history of abnormal bleeding and history of neuropathies or nerve damage  Discussed with patient possible complications of procedure, such as discomfort, bleeding at insertion/stimulation site, procedure consent signed  Patient goals: Patient would like to reduce his trips to the bathroom.  PTNS treatment: The needle electrode was inserted into the lower, inner aspect of the patient's right leg. The surface electrode was placed on the inside arch of the foot on the treatment leg. The lead set was connected to the stimulator and the needle electrode clip was connected to the needle electrode. The stimulator that  produces an adjustable electrical pulse that travels to the sacral nerve plexus via the tibial nerve was increased to 3 until a patient received a sensory response.    Treatment Plan:  Patient tolerated the stimulation for the thirty minutes and the electrode was removed without difficulty.  He will return to the clinic in one week for his # 3 PTNS treatment.  He will follow up with Dr. Pilar Jarvis after he has completed the twelve week cycle.

## 2015-09-25 DIAGNOSIS — R0602 Shortness of breath: Secondary | ICD-10-CM | POA: Diagnosis not present

## 2015-09-25 DIAGNOSIS — G4733 Obstructive sleep apnea (adult) (pediatric): Secondary | ICD-10-CM | POA: Diagnosis not present

## 2015-09-30 ENCOUNTER — Ambulatory Visit (INDEPENDENT_AMBULATORY_CARE_PROVIDER_SITE_OTHER): Payer: PPO | Admitting: Obstetrics and Gynecology

## 2015-09-30 ENCOUNTER — Encounter: Payer: Self-pay | Admitting: Obstetrics and Gynecology

## 2015-09-30 VITALS — BP 150/84 | HR 67 | Resp 16 | Ht 70.0 in | Wt 338.7 lb

## 2015-09-30 DIAGNOSIS — N3281 Overactive bladder: Secondary | ICD-10-CM

## 2015-09-30 LAB — PTNS-PERCUTANEOUS TIBIAL NERVE STIMULATION: SCAN RESULT: 8

## 2015-09-30 NOTE — Progress Notes (Signed)
PTNS  Session # 3  Health & Social Factors: No change Caffeine: 1 Alcohol: 0 Daytime voids #per day: 10 Night-time voids #per night: 1 Urgency: >Mild Incontinence Episodes #per day: 2 Ankle used: Right Treatment Setting: 8 Feeling/ Response: Both Comments: n/a  Preformed By: Herbert Moors, FNP  Assistant: Golden Hurter, CMA  Follow Up: 10/07/2015

## 2015-09-30 NOTE — Progress Notes (Signed)
Chief Complaint:  Chief Complaint  Patient presents with  . PTNS     HPI: Patient is a 70 year old Caucasian [place] who presents today for his 3rd PTNS treatment.    Background history The patient is  with a long-standing history of overactive bladder. He has been treated on at least 3 overactive bladder medications including Myrbetriq. His biggest complaint is frequency during the day of every 45 minutes. He has nocturia 1. His PVR is 29. He had a cystoscopy by Dr. 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 He is not a good candidate for surgical operation due to his extreme obesity. Dr. Elnoria Howard discussed TUMT with the patient at his last visit. Patient's insurance will not pay for it. The patient has been on Flomax before but it did not help him.   His baseline urinary symptoms consist of 10 day time voids, 1 night time void and 2 incontinence episode daily.  He has a strong urge to void.  He has 1 beverage with caffeine daily.  Previous Therapy: Patient has been on Toviaz, Myrbetriq and Flomax without benefit.    BP 150/84 mmHg  Pulse 67  Resp 16  Ht 5\' 10"  (1.778 m)  Wt 338 lb 11.2 oz (153.633 kg)  BMI 48.Jimmy kg/m2  Patient did not have contraindications present for PTNS, such as a pacemaker, implantable defibrillator, history of abnormal bleeding and history of neuropathies or nerve damage  Discussed with patient possible complications of procedure, such as discomfort, bleeding at insertion/stimulation site, procedure consent signed  Patient goals: Patient would like to reduce his trips to the bathroom.  PTNS treatment: The needle electrode was inserted into the lower, inner aspect of the patient's right leg. The surface electrode was placed on the inside arch of the foot on the treatment leg. The lead set was connected to the stimulator and the needle electrode clip was connected to the needle electrode. The stimulator that produces an  adjustable electrical pulse that travels to the sacral nerve plexus via the tibial nerve was increased to 8 until a patient received a sensory response.    Treatment Plan:  Patient tolerated the stimulation for the thirty minutes and the electrode was removed without difficulty.  He will return to the clinic in one week for his # 3 PTNS treatment.  He will follow up with Dr. Pilar Jarvis after he has completed the twelve week cycle.

## 2015-10-01 DIAGNOSIS — R079 Chest pain, unspecified: Secondary | ICD-10-CM | POA: Diagnosis not present

## 2015-10-07 ENCOUNTER — Ambulatory Visit (INDEPENDENT_AMBULATORY_CARE_PROVIDER_SITE_OTHER): Payer: PPO | Admitting: Urology

## 2015-10-07 ENCOUNTER — Encounter: Payer: Self-pay | Admitting: Urology

## 2015-10-07 VITALS — BP 143/79 | HR 66 | Ht 70.0 in | Wt 336.3 lb

## 2015-10-07 DIAGNOSIS — N3281 Overactive bladder: Secondary | ICD-10-CM | POA: Diagnosis not present

## 2015-10-07 NOTE — Progress Notes (Deleted)
PTNS  Session # 4  Health & Social Factors: no change Caffeine: 1-2 Alcohol: 0 Daytime voids #per day: 10 Night-time voids #per night: 1 Urgency: strong Incontinence Episodes #per day: 2 Ankle used: right Treatment Setting: 9 Feeling/ Response: sensory  Preformed By: Zara Council, PA  Assistant: Toniann Fail, LPN

## 2015-10-07 NOTE — Progress Notes (Signed)
Chief Complaint:  Chief Complaint  Patient presents with  . PTNS     HPI: Patient is a 70 year old Caucasian male who presents today for his 4th PTNS treatment.    Background history The patient is  with a long-standing history of overactive bladder. He has been treated on at least 3 overactive bladder medications including Myrbetriq. His biggest complaint is frequency during the day of every 45 minutes. He has nocturia 1. His PVR is 29. He had a cystoscopy by Dr. 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 He is not a good candidate for surgical operation due to his extreme obesity. Dr. Elnoria Howard discussed TUMT with the patient at his last visit. Patient's insurance will not pay for it. The patient has been on Flomax before but it did not help him.   After his last treatment, his baseline urinary symptoms consist of 10 day time voids (stable), 1 night time void (stable) and 2 incontinence episode daily (stable).  He has a strong urge to void.  He has 1 beverage with caffeine daily.  Previous Therapy: Patient has been on Toviaz, Myrbetriq and Flomax without benefit.    BP 143/79 mmHg  Pulse 66  Ht 5\' 10"  (1.778 m)  Wt 336 lb 4.8 oz (152.545 kg)  BMI 48.25 kg/m2  Patient did not have contraindications present for PTNS, such as a pacemaker, implantable defibrillator, history of abnormal bleeding and history of neuropathies or nerve damage  Discussed with patient possible complications of procedure, such as discomfort, bleeding at insertion/stimulation site, procedure consent signed  Patient goals: Patient would like to reduce his trips to the bathroom.  PTNS treatment: The needle electrode was inserted into the lower, inner aspect of the patient's right leg. The surface electrode was placed on the inside arch of the foot on the treatment leg. The lead set was connected to the stimulator and the needle electrode clip was connected to the needle  electrode. The stimulator that produces an adjustable electrical pulse that travels to the sacral nerve plexus via the tibial nerve was increased to 9 until a patient received a sensory response.    Treatment Plan:  Patient tolerated the stimulation for the thirty minutes and the electrode was removed without difficulty.  He will return to the clinic in one week for his # 5 PTNS treatment.  He will follow up with Dr. Pilar Jarvis after he has completed the twelve week cycle.

## 2015-10-07 NOTE — Progress Notes (Signed)
PTNS  Session # 4  Health & Social Factors: same Caffeine: 1-2 Alcohol: 0 Daytime voids #per day: 10 Night-time voids #per night: 1 Urgency: strong Incontinence Episodes #per day: 2 Ankle used: right Treatment Setting: 9 Feeling/ Response: sensory Comments: none  Preformed By: Zara Council, PAC  Assistant: Fonnie Jarvis, CMA  Follow Up: 1 wk

## 2015-10-07 NOTE — Addendum Note (Signed)
Addended by: Lestine Box on: 10/07/2015 03:53 PM   Modules accepted: Orders

## 2015-10-14 ENCOUNTER — Ambulatory Visit (INDEPENDENT_AMBULATORY_CARE_PROVIDER_SITE_OTHER): Payer: PPO | Admitting: Urology

## 2015-10-14 ENCOUNTER — Encounter: Payer: Self-pay | Admitting: Urology

## 2015-10-14 VITALS — BP 149/74 | HR 85 | Ht 70.0 in | Wt 334.6 lb

## 2015-10-14 DIAGNOSIS — R350 Frequency of micturition: Secondary | ICD-10-CM

## 2015-10-14 DIAGNOSIS — N3281 Overactive bladder: Secondary | ICD-10-CM | POA: Diagnosis not present

## 2015-10-14 LAB — URINALYSIS, COMPLETE
Bilirubin, UA: NEGATIVE
Glucose, UA: NEGATIVE
Leukocytes, UA: NEGATIVE
NITRITE UA: NEGATIVE
Specific Gravity, UA: 1.02 (ref 1.005–1.030)
Urobilinogen, Ur: 1 mg/dL (ref 0.2–1.0)
pH, UA: 6 (ref 5.0–7.5)

## 2015-10-14 LAB — MICROSCOPIC EXAMINATION: BACTERIA UA: NONE SEEN

## 2015-10-14 LAB — PTNS-PERCUTANEOUS TIBIAL NERVE STIMULATION: Scan Result: 1

## 2015-10-14 NOTE — Progress Notes (Signed)
Chief Complaint:  Chief Complaint  Patient presents with  . Over Active Bladder    PTNS     HPI: Patient is a 70 year old Caucasian male who presents today for his 5th PTNS treatment.    Background history The patient is  with a long-standing history of overactive bladder. He has been treated on at least 3 overactive bladder medications including Myrbetriq. His biggest complaint is frequency during the day of every 45 minutes. He has nocturia 1. His PVR is 29. He had a cystoscopy by Dr. 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 He is not a good candidate for surgical operation due to his extreme obesity. Dr. Elnoria Howard discussed TUMT with the patient at his last visit. Patient's insurance will not pay for it. The patient has been on Flomax before but it did not help him.   After his last treatment, his baseline urinary symptoms consist of 11 day time voids (worse), 2 night time void (worse) and 3 incontinence episode daily (worse).  He has a strong urge to void.  He has 1 beverage with caffeine daily.  Previous Therapy: Patient has been on Toviaz, Myrbetriq and Flomax without benefit.    BP 149/74 mmHg  Pulse 85  Ht 5\' 10"  (1.778 m)  Wt 334 lb 9.6 oz (151.774 kg)  BMI 48.01 kg/m2  Patient did not have contraindications present for PTNS, such as a pacemaker, implantable defibrillator, history of abnormal bleeding and history of neuropathies or nerve damage  Discussed with patient possible complications of procedure, such as discomfort, bleeding at insertion/stimulation site, procedure consent signed  Patient goals: Patient would like to reduce his trips to the bathroom.  PTNS treatment: The needle electrode was inserted into the lower, inner aspect of the patient's right leg. The surface electrode was placed on the inside arch of the foot on the treatment leg. The lead set was connected to the stimulator and the needle electrode clip was  connected to the needle electrode. The stimulator that produces an adjustable electrical pulse that travels to the sacral nerve plexus via the tibial nerve was increased to 1 until a patient received a sensory response.    Treatment Plan:  Patient tolerated the stimulation for the thirty minutes and the electrode was removed without difficulty.  He will return to the clinic in one week for his # 6 PTNS treatment.  He will follow up with Dr. Pilar Jarvis after he has completed the twelve week cycle.

## 2015-10-16 DIAGNOSIS — I739 Peripheral vascular disease, unspecified: Secondary | ICD-10-CM | POA: Diagnosis not present

## 2015-10-16 DIAGNOSIS — F331 Major depressive disorder, recurrent, moderate: Secondary | ICD-10-CM | POA: Diagnosis not present

## 2015-10-16 DIAGNOSIS — I10 Essential (primary) hypertension: Secondary | ICD-10-CM | POA: Diagnosis not present

## 2015-10-16 DIAGNOSIS — E782 Mixed hyperlipidemia: Secondary | ICD-10-CM | POA: Diagnosis not present

## 2015-10-16 DIAGNOSIS — N401 Enlarged prostate with lower urinary tract symptoms: Secondary | ICD-10-CM | POA: Diagnosis not present

## 2015-10-19 ENCOUNTER — Ambulatory Visit: Payer: PPO | Admitting: Urology

## 2015-10-21 ENCOUNTER — Encounter: Payer: Self-pay | Admitting: Urology

## 2015-10-21 ENCOUNTER — Ambulatory Visit (INDEPENDENT_AMBULATORY_CARE_PROVIDER_SITE_OTHER): Payer: PPO | Admitting: Urology

## 2015-10-21 VITALS — BP 144/76 | HR 70 | Ht 70.0 in | Wt 330.8 lb

## 2015-10-21 DIAGNOSIS — R350 Frequency of micturition: Secondary | ICD-10-CM

## 2015-10-21 LAB — PTNS-PERCUTANEOUS TIBIAL NERVE STIMULATION: SCAN RESULT: 5

## 2015-10-21 NOTE — Progress Notes (Signed)
Chief Complaint:  Chief Complaint  Patient presents with  . Urinary Frequency    PTNS     HPI: Patient is a 70 year old Caucasian male who presents today for his 6th PTNS treatment.    Background history The patient is  with a long-standing history of overactive bladder. He has been treated on at least 3 overactive bladder medications including Myrbetriq. His biggest complaint is frequency during the day of every 45 minutes. He has nocturia 1. His PVR is 29. He had a cystoscopy by Dr. 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 He is not a good candidate for surgical operation due to his extreme obesity. Dr. Elnoria Howard discussed TUMT with the patient at his last visit. Patient's insurance will not pay for it. The patient has been on Flomax before but it did not help him.   After his last treatment, his baseline urinary symptoms consist of 11 day time voids (unchanged), 1 night time void (improved) and 3 incontinence episode daily (worse).  He has a strong urge to void.  He has 1 beverage with caffeine daily.  Previous Therapy: Patient has been on Toviaz, Myrbetriq and Flomax without benefit.    BP 144/76 mmHg  Pulse 70  Ht 5\' 10"  (1.778 m)  Wt 330 lb 12.8 oz (150.05 kg)  BMI 47.46 kg/m2  Patient did not have contraindications present for PTNS, such as a pacemaker, implantable defibrillator, history of abnormal bleeding and history of neuropathies or nerve damage  Discussed with patient possible complications of procedure, such as discomfort, bleeding at insertion/stimulation site, procedure consent signed  Patient goals: Patient would like to reduce his trips to the bathroom.  PTNS treatment: The needle electrode was inserted into the lower, inner aspect of the patient's right leg. The surface electrode was placed on the inside arch of the foot on the treatment leg. The lead set was connected to the stimulator and the needle electrode clip was  connected to the needle electrode. The stimulator that produces an adjustable electrical pulse that travels to the sacral nerve plexus via the tibial nerve was increased to 5 until a patient received a sensory and toe flex response.    Treatment Plan:  Patient tolerated the stimulation for the thirty minutes and the electrode was removed without difficulty.  He will return to the clinic in one week for his # 7 PTNS treatment.  He will follow up with Dr. Pilar Jarvis after he has completed the twelve week cycle.

## 2015-10-28 ENCOUNTER — Ambulatory Visit (INDEPENDENT_AMBULATORY_CARE_PROVIDER_SITE_OTHER): Payer: PPO | Admitting: Urology

## 2015-10-28 ENCOUNTER — Encounter: Payer: Self-pay | Admitting: Urology

## 2015-10-28 VITALS — BP 138/75 | HR 71 | Ht 70.0 in | Wt 333.9 lb

## 2015-10-28 DIAGNOSIS — R350 Frequency of micturition: Secondary | ICD-10-CM

## 2015-10-28 LAB — PTNS-PERCUTANEOUS TIBIAL NERVE STIMULATION: SCAN RESULT: 16

## 2015-10-28 NOTE — Progress Notes (Signed)
Chief Complaint:  Chief Complaint  Patient presents with  . Urinary Frequency    PTNS     HPI: Patient is a 70 year old Caucasian male who presents today for his 7th PTNS treatment.    Background history The patient is  with a long-standing history of overactive bladder. He has been treated on at least 3 overactive bladder medications including Myrbetriq. His biggest complaint is frequency during the day of every 45 minutes. He has nocturia 1. His PVR is 29. He had a cystoscopy by Dr. 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 He is not a good candidate for surgical operation due to his extreme obesity. Dr. Elnoria Howard discussed TUMT with the patient at his last visit. Patient's insurance will not pay for it. The patient has been on Flomax before but it did not help him.   After his last treatment, his baseline urinary symptoms consist of 12 day time voids (worse), 1 night time void (unchanged) and 3 incontinence episode daily (stable).  He has a strong urge to void.  He has 1 beverage with caffeine daily.  Previous Therapy: Patient has been on Toviaz, Myrbetriq and Flomax without benefit.    BP 138/75 mmHg  Pulse 71  Ht 5\' 10"  (1.778 m)  Wt 333 lb 14.4 oz (151.456 kg)  BMI 47.91 kg/m2  Patient did not have contraindications present for PTNS, such as a pacemaker, implantable defibrillator, history of abnormal bleeding and history of neuropathies or nerve damage  Discussed with patient possible complications of procedure, such as discomfort, bleeding at insertion/stimulation site, procedure consent signed  Patient goals: Patient would like to reduce his trips to the bathroom.  PTNS treatment: The needle electrode was inserted into the lower, inner aspect of the patient's right leg. The surface electrode was placed on the inside arch of the foot on the treatment leg. The lead set was connected to the stimulator and the needle electrode clip was  connected to the needle electrode. The stimulator that produces an adjustable electrical pulse that travels to the sacral nerve plexus via the tibial nerve was increased to 6 until a patient received a sensory and toe flex response.    Treatment Plan:  Patient tolerated the stimulation for the thirty minutes and the electrode was removed without difficulty.  He will return to the clinic in one week for his # 8 PTNS treatment.  He will follow up with Dr. Pilar Jarvis after he has completed the twelve week cycle.

## 2015-11-04 ENCOUNTER — Ambulatory Visit (INDEPENDENT_AMBULATORY_CARE_PROVIDER_SITE_OTHER): Payer: PPO | Admitting: Urology

## 2015-11-04 ENCOUNTER — Encounter: Payer: Self-pay | Admitting: Urology

## 2015-11-04 VITALS — BP 144/73 | HR 73 | Ht 70.0 in | Wt 330.9 lb

## 2015-11-04 DIAGNOSIS — R350 Frequency of micturition: Secondary | ICD-10-CM | POA: Diagnosis not present

## 2015-11-04 DIAGNOSIS — N3281 Overactive bladder: Secondary | ICD-10-CM | POA: Diagnosis not present

## 2015-11-04 LAB — PTNS-PERCUTANEOUS TIBIAL NERVE STIMULATION: Scan Result: 10

## 2015-11-04 NOTE — Progress Notes (Signed)
11/04/2015  Chief Complaint:  Chief Complaint  Patient presents with  . Over Active Bladder    PTNS  . Urinary Frequency     HPI: Patient is a 70 year old Caucasian male who presents today for his 8th PTNS treatment.    Background history The patient is  with a long-standing history of overactive bladder. He has been treated on at least 3 overactive bladder medications including Myrbetriq. His biggest complaint is frequency during the day of every 45 minutes. He has nocturia 1. His PVR is 29. He had a cystoscopy by Dr. 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 He is not a good candidate for surgical operation due to his extreme obesity. Dr. Elnoria Howard discussed TUMT with the patient at his last visit. Patient's insurance will not pay for it. The patient has been on Flomax before but it did not help him.   Previous Therapy: Patient has been on Toviaz, Myrbetriq and Flomax without benefit.    BP 144/73 mmHg  Pulse 73  Ht 5\' 10"  (1.778 m)  Wt 330 lb 14.4 oz (150.095 kg)  BMI 47.48 kg/m2  Patient did not have contraindications present for PTNS, such as a pacemaker, implantable defibrillator, history of abnormal bleeding and history of neuropathies or nerve damage  Discussed with patient possible complications of procedure, such as discomfort, bleeding at insertion/stimulation site, procedure consent signed  Patient goals: Patient would like to reduce his trips to the bathroom.  PTNS treatment: The needle electrode was inserted into the lower, inner aspect of the patient's right leg. The surface electrode was placed on the inside arch of the foot on the treatment leg. The lead set was connected to the stimulator and the needle electrode clip was connected to the needle electrode. The stimulator that produces an adjustable electrical pulse that travels to the sacral nerve plexus via the tibial nerve was increased to 6 until a patient received a  sensory and toe flex response.    Treatment Plan:  Patient tolerated the stimulation for the thirty minutes and the electrode was removed without difficulty.  He will return to the clinic in one week for his #9  PTNS treatment.  He will follow up with Dr. Pilar Jarvis after he has completed the twelve week cycle.    Hollice Espy, MD

## 2015-11-04 NOTE — Progress Notes (Signed)
PTNS  Session # 8  Health & Social Factors: Reports more urgency Caffeine: 2 Alcohol: 0 Daytime voids #per day: 12 Night-time voids #per night: 1 Urgency: Severe Incontinence Episodes #per day: 2-3 Ankle used: Right Treatment Setting: 10 Feeling/ Response: Sensory  Preformed By: Hollice Espy MD  Assistant: Lyndee Hensen CMA  Follow Up: One week

## 2015-11-11 ENCOUNTER — Encounter: Payer: Self-pay | Admitting: Urology

## 2015-11-11 ENCOUNTER — Ambulatory Visit (INDEPENDENT_AMBULATORY_CARE_PROVIDER_SITE_OTHER): Payer: PPO | Admitting: Urology

## 2015-11-11 VITALS — BP 151/78 | HR 77 | Ht 69.0 in | Wt 333.2 lb

## 2015-11-11 DIAGNOSIS — R350 Frequency of micturition: Secondary | ICD-10-CM | POA: Diagnosis not present

## 2015-11-11 LAB — PTNS-PERCUTANEOUS TIBIAL NERVE STIMULATION: SCAN RESULT: 1

## 2015-11-11 NOTE — Progress Notes (Signed)
Chief Complaint:  Chief Complaint  Patient presents with  . Urinary Frequency    PTNS     HPI: Patient is a 70 year old Caucasian male who presents today for his 9th PTNS treatment.    Background history The patient is  with a long-standing history of overactive bladder. He has been treated on at least 3 overactive bladder medications including Myrbetriq. His biggest complaint is frequency during the day of every 45 minutes. He has nocturia 1. His PVR is 29. He had a cystoscopy by Dr. 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 He is not a good candidate for surgical operation due to his extreme obesity. Dr. Elnoria Howard discussed TUMT with the patient at his last visit. Patient's insurance will not pay for it. The patient has been on Flomax before but it did not help him.   After his last treatment, his baseline urinary symptoms consist of 12 day time voids (stable), 1 night time void (unchanged) and 2 incontinence episode daily (improved).  He has a strong urge to void.  He has 1 beverage with caffeine daily.  Previous Therapy: Patient has been on Toviaz, Myrbetriq and Flomax without benefit.    BP 151/78 mmHg  Pulse 77  Ht 5\' 9"  (1.753 m)  Wt 333 lb 3.2 oz (151.139 kg)  BMI 49.18 kg/m2  Patient did not have contraindications present for PTNS, such as a pacemaker, implantable defibrillator, history of abnormal bleeding and history of neuropathies or nerve damage  Discussed with patient possible complications of procedure, such as discomfort, bleeding at insertion/stimulation site, procedure consent signed  Patient goals: Patient would like to reduce his trips to the bathroom.  PTNS treatment: The needle electrode was inserted into the lower, inner aspect of the patient's right leg. The surface electrode was placed on the inside arch of the foot on the treatment leg. The lead set was connected to the stimulator and the needle electrode clip was  connected to the needle electrode. The stimulator that produces an adjustable electrical pulse that travels to the sacral nerve plexus via the tibial nerve was increased to 1 until a patient received a sensory  response.    Treatment Plan:  Patient tolerated the stimulation for the thirty minutes and the electrode was removed without difficulty.  He will return to the clinic in one week for his # 10 PTNS treatment.  He will follow up with Dr. Pilar Jarvis after he has completed the twelve week cycle.

## 2015-11-13 DIAGNOSIS — H25813 Combined forms of age-related cataract, bilateral: Secondary | ICD-10-CM | POA: Diagnosis not present

## 2015-11-18 ENCOUNTER — Encounter: Payer: Self-pay | Admitting: Urology

## 2015-11-18 ENCOUNTER — Ambulatory Visit (INDEPENDENT_AMBULATORY_CARE_PROVIDER_SITE_OTHER): Payer: PPO | Admitting: Urology

## 2015-11-18 VITALS — BP 152/69 | HR 73 | Ht 71.0 in | Wt 331.1 lb

## 2015-11-18 DIAGNOSIS — R350 Frequency of micturition: Secondary | ICD-10-CM | POA: Diagnosis not present

## 2015-11-18 LAB — PTNS-PERCUTANEOUS TIBIAL NERVE STIMULATION: SCAN RESULT: 17

## 2015-11-18 NOTE — Progress Notes (Signed)
Chief Complaint:  Chief Complaint  Patient presents with  . Urinary Frequency    PTNS     HPI: Patient is a 70 year old Caucasian male who presents today for his 10th PTNS treatment.    Background history The patient is  with a long-standing history of overactive bladder. He has been treated on at least 3 overactive bladder medications including Myrbetriq. His biggest complaint is frequency during the day of every 45 minutes. He has nocturia 1. His PVR is 29. He had a cystoscopy by Dr. 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 He is not a good candidate for surgical operation due to his extreme obesity. Dr. Elnoria Howard discussed TUMT with the patient at his last visit. Patient's insurance will not pay for it. The patient has been on Flomax before but it did not help him.   After his last treatment, his baseline urinary symptoms consist of 12 day time voids (stable), 1 night time void (unchanged) and 2 incontinence episode daily (improved).  He has a strong urge to void.  He has 1 beverage with caffeine daily.  Previous Therapy: Patient has been on Toviaz, Myrbetriq and Flomax without benefit.    BP 152/69 mmHg  Pulse 73  Ht 5\' 11"  (1.803 m)  Wt 331 lb 1.6 oz (150.186 kg)  BMI 46.20 kg/m2  Patient did not have contraindications present for PTNS, such as a pacemaker, implantable defibrillator, history of abnormal bleeding and history of neuropathies or nerve damage  Discussed with patient possible complications of procedure, such as discomfort, bleeding at insertion/stimulation site, procedure consent signed  Patient goals: Patient would like to reduce his trips to the bathroom.  PTNS treatment: The needle electrode was inserted into the lower, inner aspect of the patient's right leg. The surface electrode was placed on the inside arch of the foot on the treatment leg. The lead set was connected to the stimulator and the needle electrode clip was  connected to the needle electrode. The stimulator that produces an adjustable electrical pulse that travels to the sacral nerve plexus via the tibial nerve was increased to 17 until a patient received a sensory response and toe flex response.  Treatment Plan:  Patient tolerated the stimulation for the thirty minutes and the electrode was removed without difficulty.  He will return to the clinic in one week for his # 11 PTNS treatment.  He will follow up with Dr. Pilar Jarvis after he has completed the twelve week cycle.

## 2015-11-25 ENCOUNTER — Ambulatory Visit (INDEPENDENT_AMBULATORY_CARE_PROVIDER_SITE_OTHER): Payer: PPO | Admitting: Urology

## 2015-11-25 ENCOUNTER — Encounter: Payer: Self-pay | Admitting: Urology

## 2015-11-25 VITALS — BP 139/77 | HR 73 | Ht 72.0 in | Wt 333.1 lb

## 2015-11-25 DIAGNOSIS — R350 Frequency of micturition: Secondary | ICD-10-CM | POA: Diagnosis not present

## 2015-11-25 DIAGNOSIS — N3941 Urge incontinence: Secondary | ICD-10-CM | POA: Diagnosis not present

## 2015-11-25 LAB — MICROSCOPIC EXAMINATION

## 2015-11-25 LAB — URINALYSIS, COMPLETE
BILIRUBIN UA: NEGATIVE
GLUCOSE, UA: NEGATIVE
KETONES UA: NEGATIVE
Leukocytes, UA: NEGATIVE
NITRITE UA: NEGATIVE
RBC UA: NEGATIVE
SPEC GRAV UA: 1.02 (ref 1.005–1.030)
UUROB: 0.2 mg/dL (ref 0.2–1.0)
pH, UA: 5.5 (ref 5.0–7.5)

## 2015-11-25 LAB — PTNS-PERCUTANEOUS TIBIAL NERVE STIMULATION: SCAN RESULT: 17

## 2015-11-25 NOTE — Progress Notes (Signed)
Chief Complaint:  Chief Complaint  Patient presents with  . Urinary Frequency    PTNS     HPI: Patient is a 70 year old Caucasian male who presents today for his 11th PTNS treatment.    Background history The patient is  with a long-standing history of overactive bladder. He has been treated on at least 3 overactive bladder medications including Myrbetriq. His biggest complaint is frequency during the day of every 45 minutes. He has nocturia 1. His PVR is 29. He had a cystoscopy by Dr. 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 He is not a good candidate for surgical operation due to his extreme obesity. Dr. Elnoria Howard discussed TUMT with the patient at his last visit. Patient's insurance will not pay for it. The patient has been on Flomax before but it did not help him.   After his last treatment, his baseline urinary symptoms consist of 12 day time voids (stable), 1 night time void (unchanged) and 2 incontinence episode daily (stable).  He has a strong urge to void.  He has 1 beverage with caffeine daily.  He has noted an increase in urgency and urge incontinence over the last couple weeks.  He denies dysuria, suprapubic pain and gross hematuria.  He denies fevers, chills, nausea and vomiting.    Previous Therapy: Patient has been on Toviaz, Myrbetriq and Flomax without benefit.    Urinalysis Results for orders placed or performed in visit on 11/25/15  PTNS-Percutaneous Tibial Nerve Stimulati  Result Value Ref Range   Scan Result 17   Microscopic Examination  Result Value Ref Range   WBC, UA 0-5 0 -  5 /hpf   RBC, UA 0-2 0 -  2 /hpf   Epithelial Cells (non renal) 0-10 0 - 10 /hpf   Mucus, UA Present (A) Not Estab.   Bacteria, UA Few None seen/Few  Hemoglobin A1c  Result Value Ref Range   Hgb A1c MFr Bld 6.1 (H) 4.8 - 5.6 %   Est. average glucose Bld gHb Est-mCnc 128 mg/dL  Urinalysis, Complete  Result Value Ref Range   Specific Gravity, UA  1.020 1.005 - 1.030   pH, UA 5.5 5.0 - 7.5   Color, UA Yellow Yellow   Appearance Ur Cloudy (A) Clear   Leukocytes, UA Negative Negative   Protein, UA Trace (A) Negative/Trace   Glucose, UA Negative Negative   Ketones, UA Negative Negative   RBC, UA Negative Negative   Bilirubin, UA Negative Negative   Urobilinogen, Ur 0.2 0.2 - 1.0 mg/dL   Nitrite, UA Negative Negative   Microscopic Examination See below:    BP 139/77 mmHg  Pulse 73  Ht 6' (1.829 m)  Wt 333 lb 1.6 oz (151.093 kg)  BMI 45.17 kg/m2  Patient did not have contraindications present for PTNS, such as a pacemaker, implantable defibrillator, history of abnormal bleeding and history of neuropathies or nerve damage  Discussed with patient possible complications of procedure, such as discomfort, bleeding at insertion/stimulation site, procedure consent signed  Patient goals: Patient would like to reduce his trips to the bathroom.  PTNS treatment: The needle electrode was inserted into the lower, inner aspect of the patient's right leg. The surface electrode was placed on the inside arch of the foot on the treatment leg. The lead set was connected to the stimulator and the needle electrode clip was connected to the needle electrode. The stimulator that produces an adjustable electrical pulse that travels  to the sacral nerve plexus via the tibial nerve was increased to 17 until a patient received a sensory response.  Treatment Plan:  I will check his HbgA1C and an UA to check for diabetes and/or possible infection.  Patient tolerated the stimulation for the thirty minutes and the electrode was removed without difficulty.  He will return to the clinic in one week for his # 12 PTNS treatment.  He will follow up with Dr. Pilar Jarvis after he has completed the twelve week cycle.

## 2015-11-26 ENCOUNTER — Telehealth: Payer: Self-pay

## 2015-11-26 LAB — HEMOGLOBIN A1C
ESTIMATED AVERAGE GLUCOSE: 128 mg/dL
Hgb A1c MFr Bld: 6.1 % — ABNORMAL HIGH (ref 4.8–5.6)

## 2015-11-26 NOTE — Telephone Encounter (Signed)
-----   Message from Nori Riis, PA-C sent at 11/26/2015  8:11 AM EDT ----- Patient's blood work indicates that he is at high risk for becoming diabetic.  He should see a dietician and his PCP.

## 2015-11-26 NOTE — Telephone Encounter (Signed)
Spoke with pt in reference to lab results and PCP. Pt voiced understanding.

## 2015-12-02 ENCOUNTER — Encounter: Payer: Self-pay | Admitting: Urology

## 2015-12-02 ENCOUNTER — Ambulatory Visit (INDEPENDENT_AMBULATORY_CARE_PROVIDER_SITE_OTHER): Payer: PPO | Admitting: Urology

## 2015-12-02 VITALS — BP 161/77 | HR 77 | Ht 71.0 in | Wt 337.7 lb

## 2015-12-02 DIAGNOSIS — R7309 Other abnormal glucose: Secondary | ICD-10-CM | POA: Insufficient documentation

## 2015-12-02 DIAGNOSIS — R350 Frequency of micturition: Secondary | ICD-10-CM

## 2015-12-02 LAB — PTNS-PERCUTANEOUS TIBIAL NERVE STIMULATION: SCAN RESULT: 15

## 2015-12-02 NOTE — Progress Notes (Signed)
Chief Complaint:  Chief Complaint  Patient presents with  . Urinary Frequency    PTNS     HPI: Patient is a 70 year old Caucasian male who presents today for his 12th and final weekly PTNS treatment.    Background history The patient is  with a long-standing history of overactive bladder. He has been treated on at least 3 overactive bladder medications including Myrbetriq. His biggest complaint is frequency during the day of every 45 minutes. He has nocturia 1. His PVR is 29. He had a cystoscopy by Dr. 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 He is not a good candidate for surgical operation due to his extreme obesity. Dr. Elnoria Howard discussed TUMT with the patient at his last visit. Patient's insurance will not pay for it. The patient has been on Flomax before but it did not help him.   After his last treatment, his baseline urinary symptoms consist of 12 day time voids (stable), 1 night time void (unchanged) and 2 incontinence episode daily (stable).  He has a strong urge to void.  He has 1 beverage with caffeine daily.    At his last visit,he noted an increase in urgency and urge incontinence over the last couple weeks.  He denies dysuria, suprapubic pain and gross hematuria.  He denies fevers, chills, nausea and vomiting.  He was found to have an elevated HbgA1C (6.1 %) at his last visit.  UA was clear.    Patient was advised to speak with his PCP, follow a diabetic diet and advised of the risk of diabetes.  He stated he wasn't really interested in changing any behaviors at this time.    Previous Therapy: Patient has been on Toviaz, Myrbetriq and Flomax without benefit.    BP 161/77 mmHg  Pulse 77  Ht 5\' 11"  (1.803 m)  Wt 337 lb 11.2 oz (153.18 kg)  BMI 47.12 kg/m2  Patient did not have contraindications present for PTNS, such as a pacemaker, implantable defibrillator, history of abnormal bleeding and history of neuropathies or nerve  damage  Discussed with patient possible complications of procedure, such as discomfort, bleeding at insertion/stimulation site, procedure consent signed  Patient goals: Patient would like to reduce his trips to the bathroom.  PTNS treatment: The needle electrode was inserted into the lower, inner aspect of the patient's right leg. The surface electrode was placed on the inside arch of the foot on the treatment leg. The lead set was connected to the stimulator and the needle electrode clip was connected to the needle electrode. The stimulator that produces an adjustable electrical pulse that travels to the sacral nerve plexus via the tibial nerve was increased to 15 until a patient received a sensory response.  Treatment Plan:  Patient tolerated the stimulation for the thirty minutes and the electrode was removed without difficulty.  He will return to the clinic for his  follow up with Dr. Pilar Jarvis.  He may return for monthly maintenance.

## 2015-12-18 ENCOUNTER — Ambulatory Visit: Payer: PPO

## 2015-12-25 ENCOUNTER — Encounter: Payer: Self-pay | Admitting: Urology

## 2015-12-25 ENCOUNTER — Ambulatory Visit (INDEPENDENT_AMBULATORY_CARE_PROVIDER_SITE_OTHER): Payer: PPO | Admitting: Urology

## 2015-12-25 VITALS — BP 147/82 | HR 73 | Ht 70.0 in | Wt 330.7 lb

## 2015-12-25 DIAGNOSIS — N3281 Overactive bladder: Secondary | ICD-10-CM

## 2015-12-25 NOTE — Progress Notes (Signed)
12/25/2015 10:11 AM   Jimmy Norton. 01/05/1946 [id number]  Referring provider: Lavera Guise, MD [address] [place], [place] [address]  Chief Complaint  Patient presents with  . Follow-up    urinary frequency    HPI: The patient is a 70 y.o. male with a long-standing history of overactive bladder. He has been treated on at least 3 overactive bladder medications including Myrbetriq. His biggest complaint is frequency during the day of every 45 minutes. He has nocturia 1. His PVR is 29. He had a cystoscopy by Dr. 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 He is not a good candidate for surgical operation due to his extreme obesity. Dr. Elnoria Howard discussed TUMT with the patient at his last visit. Patient's insurance will not pay for it. The patient has been on Flomax before but it did not help him.   After his last treatment, his baseline urinary symptoms consist of 12 day time voids (stable), 1 night time void (unchanged) and 2 incontinence episode daily (stable). He has a strong urge to void. He has 1 beverage with caffeine daily.   He has frequency of 45 minutes. He has nocturia x1. He has failed three medications for OAB.   At his last visit,he noted an increase in urgency and urge incontinence over the last couple weeks. He denies dysuria, suprapubic pain and gross hematuria. He denies fevers, chills, nausea and vomiting. He was found to have an elevated HbgA1C (6.1 %) at his last visit. UA was clear.   Patient was advised to speak with his PCP, follow a diabetic diet and advised of the risk of diabetes. He stated he wasn't really interested in changing any behaviors at this time   Urodynamics: The patient was noted to have positive instability with involuntary voiding. He was able to void voluntarily ago. He did have a question of diverticulum in his bladder. His PVR was negligible. His maximum flow rate was 14 mi./s. His detrusor  pressure at maximum flow was 33 cm of water with severe urinary leakage from an unstable bladder contraction.   Interval history: Patient follows up today after completing 12 cycles of PTNS. He has noted improvement with PTFE. He is not interested in more invasive procedures at this time. He will like to continue maintenance PTNS at this time.  PMH: Past Medical History  Diagnosis Date  . Kidney problem   May  2013  . Gallstones 03/05/2013  . OAB (overactive bladder)   . Anxiety and depression   . Frequency     Surgical History: Past Surgical History  Procedure Laterality Date  . Hernia repair      x 2  . Nose surgery    . Hand surgery Bilateral     Home Medications:    Medication List       This list is accurate as of: 12/25/15 10:11 AM.  Always use your most recent med list.               amLODipine 10 MG tablet  Commonly known as:  NORVASC  Take 10 mg by mouth. Reported on 09/11/2015     B-12 2500 MCG Tabs  Take 1 tablet by mouth daily.     cholecalciferol 400 units Tabs tablet  Commonly known as:  VITAMIN D  Take 400 Units by mouth daily.     cilostazol 50 MG tablet  Commonly known as:  PLETAL  Take 50 mg by  mouth 2 (two) times daily.     glucosamine-chondroitin 500-400 MG tablet  Take 1 tablet by mouth 2 (two) times daily.     glucose blood test strip  1 each by Other route as needed for other. Reported on 09/11/2015     hyoscyamine 0.125 MG tablet  Commonly known as:  LEVSIN, ANASPAZ  Take 0.125 mg by mouth every 4 (four) hours as needed. Reported on 11/18/2015     multivitamin with minerals tablet  Take 1 tablet by mouth daily. Reported on 09/11/2015     pyridoxine 100 MG tablet  Commonly known as:  B-6  Take 100 mg by mouth daily.     sertraline 25 MG tablet  Commonly known as:  ZOLOFT  Take 25 mg by mouth 2 (two) times daily.        Allergies:  Allergies  Allergen Reactions  . Bee Venom Anaphylaxis  . Penicillins Rash    Family  History: Family History  Problem Relation Age of Onset  . COPD Mother   . Heart disease Mother   . Cancer Father     Brain tumor  . Kidney disease Neg Hx   . Prostate cancer Neg Hx   . Lung cancer Paternal Uncle   . Cancer - Other Maternal Grandmother     Back    Social History:  reports that he has never smoked. His smokeless tobacco use includes Chew. He reports that he does not drink alcohol or use illicit drugs.  ROS: UROLOGY Frequent Urination?: Yes Hard to postpone urination?: Yes Burning/pain with urination?: No Leakage of urine?: Yes Urine stream starts and stops?: Yes Trouble starting stream?: No Do you have to strain to urinate?: No Blood in urine?: No Urinary tract infection?: No Sexually transmitted disease?: No Injury to kidneys or bladder?: No Painful intercourse?: No Weak stream?: No Erection problems?: Yes Penile pain?: No  Gastrointestinal Nausea?: No Vomiting?: No Indigestion/heartburn?: Yes Diarrhea?: Yes Constipation?: Yes  Constitutional Fever: No Night sweats?: No Weight loss?: No Fatigue?: Yes  Skin Skin rash/lesions?: No Itching?: No  Eyes Blurred vision?: Yes Double vision?: Yes  Ears/Nose/Throat Sore throat?: No Sinus problems?: No  Hematologic/Lymphatic Swollen glands?: No Easy bruising?: No  Cardiovascular Leg swelling?: Yes Chest pain?: Yes  Respiratory Cough?: Yes Shortness of breath?: Yes  Endocrine Excessive thirst?: Yes  Musculoskeletal Back pain?: Yes Joint pain?: Yes  Neurological Headaches?: No Dizziness?: No  Psychologic Depression?: Yes Anxiety?: Yes  Physical Exam: BP 147/82 mmHg  Pulse 73  Ht 5\' 10"  (1.778 m)  Wt 330 lb 11.2 oz (150.005 kg)  BMI 47.45 kg/m2  Constitutional:  Alert and oriented, No acute distress. HEENT: [place] AT, moist mucus membranes.  Trachea midline, no masses. Cardiovascular: No clubbing, cyanosis, or edema. Respiratory: Normal respiratory effort, no increased work  of breathing. GI: Abdomen is soft, nontender, nondistended, no abdominal masses GU: No CVA tenderness.  Skin: No rashes, bruises or suspicious lesions. Lymph: No cervical or inguinal adenopathy. Neurologic: Grossly intact, no focal deficits, moving all 4 extremities. Psychiatric: Normal mood and affect.  Laboratory Data: Lab Results  Component Value Date   WBC 7.7 07/31/2015   HGB 14.9 12/30/2013   HCT 45.7 07/31/2015   MCV 85 07/31/2015   PLT 292 07/31/2015    Lab Results  Component Value Date   CREATININE 1.15 12/31/2013    No results found for: PSA  No results found for: TESTOSTERONE  Lab Results  Component Value Date   HGBA1C 6.1* 11/25/2015  Urinalysis    Component Value Date/Time   COLORURINE Amber 12/29/2013 1342   APPEARANCEUR Cloudy* 11/25/2015 0936   APPEARANCEUR Cloudy 12/29/2013 1342   LABSPEC 1.018 12/29/2013 1342   PHURINE 5.0 12/29/2013 1342   GLUCOSEU Negative 11/25/2015 0936   GLUCOSEU Negative 12/29/2013 1342   HGBUR 3+ 12/29/2013 1342   BILIRUBINUR Negative 11/25/2015 0936   BILIRUBINUR Negative 12/29/2013 1342   KETONESUR Negative 12/29/2013 1342   PROTEINUR Trace* 11/25/2015 0936   PROTEINUR 100 mg/dL 12/29/2013 1342   NITRITE Negative 11/25/2015 0936   NITRITE Positive 12/29/2013 1342   LEUKOCYTESUR Negative 11/25/2015 0936   LEUKOCYTESUR 3+ 12/29/2013 1342     Assessment & Plan:    The patient has had some improvement with PTNS. He would like to start maintenance PTNS. I do not feel he is a candidate for Botox as I do not think he would be able to catheterize himself in the event of urinary retention if this were to develop. He is also not a candidate for InterStim as his morbid obesity would prevent him from the necessary prone position for surgery.  1. Overactive Bladder -PTNS maintenance monthly for 12 months  Return for as scheduled with [facility] for maintenance PTNS.  Nickie Retort, MD  [facility] [address], [place], [place] [address] [phone]

## 2016-01-01 ENCOUNTER — Encounter: Payer: Self-pay | Admitting: Urology

## 2016-01-01 ENCOUNTER — Ambulatory Visit (INDEPENDENT_AMBULATORY_CARE_PROVIDER_SITE_OTHER): Payer: PPO | Admitting: Urology

## 2016-01-01 VITALS — BP 145/89 | HR 71 | Ht 69.0 in | Wt 332.6 lb

## 2016-01-01 DIAGNOSIS — R350 Frequency of micturition: Secondary | ICD-10-CM

## 2016-01-01 LAB — PTNS-PERCUTANEOUS TIBIAL NERVE STIMULATION: Scan Result: 19

## 2016-01-05 NOTE — Progress Notes (Signed)
Chief Complaint:  Chief Complaint  Patient presents with  . Urinary Frequency    PTNS     HPI: Patient is a 70 year old Caucasian male who presents today for a maintenance PTNS treatment.    Background history The patient is  with a long-standing history of overactive bladder. He has been treated on at least 3 overactive bladder medications including Myrbetriq. His biggest complaint is frequency during the day of every 45 minutes. He has nocturia 1. His PVR is 29. He had a cystoscopy by Dr. 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 He is not a good candidate for surgical operation due to his extreme obesity. Dr. Elnoria Howard discussed TUMT with the patient at his last visit. Patient's insurance will not pay for it. The patient has been on Flomax before but it did not help him.  Patient completed his 12 weekly treatments of PTNS.  He felt it was somewhat helpful. He would to pursue other treatment modalities, but he is not a good candidate for Botox as his body habitus would not allow for catheterization if he should develop retention after the procedure. His obesity also precludes him from InterStim treatment as he could not be placed and the necessary prone position for surgery.  He has since decided to continue on with PTNS treatments.  At his last visit,he noted an increase in urgency and urge incontinence over the last couple weeks.  He denies dysuria, suprapubic pain and gross hematuria.  He denies fevers, chills, nausea and vomiting.  He was found to have an elevated HbgA1C (6.1 %) recently.    He did not speak to his PCP regarding his diabetes as he is not interested in changing any behaviors at this time.    Today, he is having frequency of urination, urgency, nocturia and incontinence.    Previous Therapy: Patient has been on Toviaz, Myrbetriq and Flomax without benefit.    BP 145/89 mmHg  Pulse 71  Ht 5\' 9"  (1.753 m)  Wt 332 lb 9.6 oz (150.866  kg)  BMI 49.09 kg/m2  Patient did not have contraindications present for PTNS, such as a pacemaker, implantable defibrillator, history of abnormal bleeding and history of neuropathies or nerve damage  Discussed with patient possible complications of procedure, such as discomfort, bleeding at insertion/stimulation site, procedure consent signed  Patient goals: Patient would like to reduce his trips to the bathroom.  PTNS treatment: The needle electrode was inserted into the lower, inner aspect of the patient's right leg. The surface electrode was placed on the inside arch of the foot on the treatment leg. The lead set was connected to the stimulator and the needle electrode clip was connected to the needle electrode. The stimulator that produces an adjustable electrical pulse that travels to the sacral nerve plexus via the tibial nerve was increased to 19 until a patient received a sensory response and toe response.    Treatment Plan:  Patient tolerated the stimulation for the thirty minutes and the electrode was removed without difficulty.  He will return in one month for maintenance PTNS.

## 2016-02-19 DIAGNOSIS — N3281 Overactive bladder: Secondary | ICD-10-CM | POA: Diagnosis not present

## 2016-02-19 DIAGNOSIS — F331 Major depressive disorder, recurrent, moderate: Secondary | ICD-10-CM | POA: Diagnosis not present

## 2016-02-19 DIAGNOSIS — I10 Essential (primary) hypertension: Secondary | ICD-10-CM | POA: Diagnosis not present

## 2016-02-19 DIAGNOSIS — K580 Irritable bowel syndrome with diarrhea: Secondary | ICD-10-CM | POA: Diagnosis not present

## 2016-02-19 DIAGNOSIS — I739 Peripheral vascular disease, unspecified: Secondary | ICD-10-CM | POA: Diagnosis not present

## 2016-02-19 DIAGNOSIS — E291 Testicular hypofunction: Secondary | ICD-10-CM | POA: Diagnosis not present

## 2016-05-16 DIAGNOSIS — E291 Testicular hypofunction: Secondary | ICD-10-CM | POA: Diagnosis not present

## 2016-05-16 DIAGNOSIS — Z0001 Encounter for general adult medical examination with abnormal findings: Secondary | ICD-10-CM | POA: Diagnosis not present

## 2016-05-16 DIAGNOSIS — I10 Essential (primary) hypertension: Secondary | ICD-10-CM | POA: Diagnosis not present

## 2016-05-16 DIAGNOSIS — Z1211 Encounter for screening for malignant neoplasm of colon: Secondary | ICD-10-CM | POA: Diagnosis not present

## 2016-05-16 DIAGNOSIS — Z125 Encounter for screening for malignant neoplasm of prostate: Secondary | ICD-10-CM | POA: Diagnosis not present

## 2016-05-16 DIAGNOSIS — R7301 Impaired fasting glucose: Secondary | ICD-10-CM | POA: Diagnosis not present

## 2016-05-27 DIAGNOSIS — E291 Testicular hypofunction: Secondary | ICD-10-CM | POA: Diagnosis not present

## 2016-05-27 DIAGNOSIS — G4733 Obstructive sleep apnea (adult) (pediatric): Secondary | ICD-10-CM | POA: Diagnosis not present

## 2016-05-27 DIAGNOSIS — E781 Pure hyperglyceridemia: Secondary | ICD-10-CM | POA: Diagnosis not present

## 2016-05-27 DIAGNOSIS — I739 Peripheral vascular disease, unspecified: Secondary | ICD-10-CM | POA: Diagnosis not present

## 2016-05-27 DIAGNOSIS — I10 Essential (primary) hypertension: Secondary | ICD-10-CM | POA: Diagnosis not present

## 2016-05-27 DIAGNOSIS — Z23 Encounter for immunization: Secondary | ICD-10-CM | POA: Diagnosis not present

## 2016-05-27 DIAGNOSIS — Z0001 Encounter for general adult medical examination with abnormal findings: Secondary | ICD-10-CM | POA: Diagnosis not present

## 2016-07-04 ENCOUNTER — Ambulatory Visit: Payer: PPO | Admitting: Urology

## 2016-07-04 ENCOUNTER — Telehealth: Payer: Self-pay | Admitting: Urology

## 2016-07-04 ENCOUNTER — Encounter: Payer: Self-pay | Admitting: Urology

## 2016-07-04 NOTE — Progress Notes (Deleted)
07/04/2016 8:15 AM   Jimmy Norton. 1946-01-07 [id number]  Referring provider: Lavera Guise, MD [address] [place], [place] [address]  No chief complaint on file.   HPI: Patient is a 70 year old Caucasian male who presents today requesting a maintenance PTNS and who has not a prostate exam in over one year.    BPH WITH LUTS His IPSS score today is ***, which is *** lower urinary tract symptomatology. He is *** with his quality life due to his urinary symptoms. His PVR is *** mL.    His major complaint today ***.  He has had these symptoms for *** years.  He denies any dysuria, hematuria or suprapubic pain.   He currently taking ***.  Patient completed a 12 week series of PTNS in May 2017.    Previous PSA's:     He also denies any recent fevers, chills, nausea or vomiting.  He has a family history of PCa, with ***.   He does not have a family history of PCa.***    Score:  1-7 Mild 8-19 Moderate 20-35 Severe    PMH: Past Medical History:  Diagnosis Date  . Anxiety and depression   . Frequency   . Gallstones 03/05/2013  . Kidney problem   May  2013  . OAB (overactive bladder)     Surgical History: Past Surgical History:  Procedure Laterality Date  . HAND SURGERY Bilateral   . HERNIA REPAIR     x 2  . NOSE SURGERY      Home Medications:    Medication List       Accurate as of 07/04/16  8:15 AM. Always use your most recent med list.          amLODipine 10 MG tablet Commonly known as:  NORVASC Take 10 mg by mouth. Reported on 09/11/2015   B-12 2500 MCG Tabs Take 1 tablet by mouth daily.   cholecalciferol 400 units Tabs tablet Commonly known as:  VITAMIN D Take 400 Units by mouth daily.   cilostazol 50 MG tablet Commonly known as:  PLETAL Take 50 mg by mouth 2 (two) times daily.   glucosamine-chondroitin 500-400 MG tablet Take 1 tablet by mouth 2 (two) times daily.   glucose blood test strip 1 each by Other route as needed for  other. Reported on 09/11/2015   hyoscyamine 0.125 MG tablet Commonly known as:  LEVSIN, ANASPAZ Take 0.125 mg by mouth every 4 (four) hours as needed. Reported on 11/18/2015   multivitamin with minerals tablet Take 1 tablet by mouth daily. Reported on 09/11/2015   pyridoxine 100 MG tablet Commonly known as:  B-6 Take 100 mg by mouth daily.   sertraline 25 MG tablet Commonly known as:  ZOLOFT Take 25 mg by mouth 2 (two) times daily.       Allergies:  Allergies  Allergen Reactions  . Bee Venom Anaphylaxis  . Penicillins Rash    Family History: Family History  Problem Relation Age of Onset  . COPD Mother   . Heart disease Mother   . Cancer Father     Brain tumor  . Kidney disease Neg Hx   . Prostate cancer Neg Hx   . Lung cancer Paternal Uncle   . Cancer - Other Maternal Grandmother     Back    Social History:  reports that he has never smoked. His smokeless tobacco use includes Chew. He reports that he does not drink alcohol or use  drugs.  ROS:                                        Physical Exam: There were no vitals taken for this visit.  Constitutional: Well nourished. Alert and oriented, No acute distress. HEENT: [place] AT, moist mucus membranes. Trachea midline, no masses. Cardiovascular: No clubbing, cyanosis, or edema. Respiratory: Normal respiratory effort, no increased work of breathing. GI: Abdomen is soft, non tender, non distended, no abdominal masses. Liver and spleen not palpable.  No hernias appreciated.  Stool sample for occult testing is not indicated.   GU: No CVA tenderness.  No bladder fullness or masses.  Patient with circumcised/uncircumcised phallus. ***Foreskin easily retracted***  Urethral meatus is patent.  No penile discharge. No penile lesions or rashes. Scrotum without lesions, cysts, rashes and/or edema.  Testicles are located scrotally bilaterally. No masses are appreciated in the testicles. Left and right epididymis  are normal. Rectal: Patient with  normal sphincter tone. Anus and perineum without scarring or rashes. No rectal masses are appreciated. Prostate is approximately *** grams, *** nodules are appreciated. Seminal vesicles are normal. Skin: No rashes, bruises or suspicious lesions. Lymph: No cervical or inguinal adenopathy. Neurologic: Grossly intact, no focal deficits, moving all 4 extremities. Psychiatric: Normal mood and affect.  Laboratory Data: Lab Results  Component Value Date   WBC 7.7 07/31/2015   HGB 14.9 12/30/2013   HCT 45.7 07/31/2015   MCV 85 07/31/2015   PLT 292 07/31/2015    Lab Results  Component Value Date   CREATININE 1.15 12/31/2013    No results found for: PSA  No results found for: TESTOSTERONE  Lab Results  Component Value Date   HGBA1C 6.1 (H) 11/25/2015    Lab Results  Component Value Date   TSH 2.600 07/31/2015       Component Value Date/Time   CHOL 157 07/31/2015 0848   HDL 36 (L) 07/31/2015 0848   CHOLHDL 4.4 07/31/2015 0848   LDLCALC 69 07/31/2015 0848    Lab Results  Component Value Date   AST 30 07/31/2015   Lab Results  Component Value Date   ALT 34 07/31/2015   No components found for: ALKALINEPHOPHATASE No components found for: BILIRUBINTOTAL  No results found for: ESTRADIOL   Urinalysis    Component Value Date/Time   COLORURINE Amber 12/29/2013 1342   APPEARANCEUR Cloudy (A) 11/25/2015 0936   LABSPEC 1.018 12/29/2013 1342   PHURINE 5.0 12/29/2013 1342   GLUCOSEU Negative 11/25/2015 0936   GLUCOSEU Negative 12/29/2013 1342   HGBUR 3+ 12/29/2013 1342   BILIRUBINUR Negative 11/25/2015 0936   BILIRUBINUR Negative 12/29/2013 1342   KETONESUR Negative 12/29/2013 1342   PROTEINUR Trace (A) 11/25/2015 0936   PROTEINUR 100 mg/dL 12/29/2013 1342   NITRITE Negative 11/25/2015 0936   NITRITE Positive 12/29/2013 1342   LEUKOCYTESUR Negative 11/25/2015 0936   LEUKOCYTESUR 3+ 12/29/2013 1342    Pertinent  Imaging: ***  Assessment & Plan:  ***  There are no diagnoses linked to this encounter.  No Follow-up on file.  These notes generated with voice recognition software. I apologize for typographical errors.  Zara Council, [facility] [address] [place], [place] [address] [phone]

## 2016-07-04 NOTE — Telephone Encounter (Signed)
Patient called and said he had a missed call from [place] last week, didn't know who it was from, I told him he had a appt with [place] today at 8:30 he said to not worry about it and not to reschd it.   Sharyn Lull

## 2016-12-13 DIAGNOSIS — M545 Low back pain: Secondary | ICD-10-CM | POA: Diagnosis not present

## 2017-03-16 ENCOUNTER — Encounter: Payer: Self-pay | Admitting: Emergency Medicine

## 2017-03-16 ENCOUNTER — Emergency Department
Admission: EM | Admit: 2017-03-16 | Discharge: 2017-03-17 | Disposition: A | Discharge status: Home / Self Care | Payer: PPO | Attending: Emergency Medicine | Admitting: Emergency Medicine

## 2017-03-16 ENCOUNTER — Emergency Department: Payer: PPO

## 2017-03-16 DIAGNOSIS — R112 Nausea with vomiting, unspecified: Secondary | ICD-10-CM | POA: Insufficient documentation

## 2017-03-16 DIAGNOSIS — Z79899 Other long term (current) drug therapy: Secondary | ICD-10-CM | POA: Insufficient documentation

## 2017-03-16 DIAGNOSIS — R1011 Right upper quadrant pain: Secondary | ICD-10-CM | POA: Diagnosis present

## 2017-03-16 DIAGNOSIS — N200 Calculus of kidney: Secondary | ICD-10-CM | POA: Insufficient documentation

## 2017-03-16 DIAGNOSIS — R109 Unspecified abdominal pain: Secondary | ICD-10-CM | POA: Diagnosis not present

## 2017-03-16 DIAGNOSIS — N202 Calculus of kidney with calculus of ureter: Secondary | ICD-10-CM | POA: Diagnosis not present

## 2017-03-16 LAB — BASIC METABOLIC PANEL
ANION GAP: 11 (ref 5–15)
BUN: 14 mg/dL (ref 6–20)
CALCIUM: 9.2 mg/dL (ref 8.9–10.3)
CHLORIDE: 107 mmol/L (ref 101–111)
CO2: 22 mmol/L (ref 22–32)
CREATININE: 1.25 mg/dL — AB (ref 0.61–1.24)
GFR calc non Af Amer: 56 mL/min — ABNORMAL LOW (ref >60)
Glucose, Bld: 137 mg/dL — ABNORMAL HIGH (ref 65–99)
Potassium: 4.1 mmol/L (ref 3.5–5.1)
SODIUM: 140 mmol/L (ref 135–145)

## 2017-03-16 LAB — URINALYSIS, COMPLETE (UACMP) WITH MICROSCOPIC
BACTERIA UA: NONE SEEN
Bilirubin Urine: NEGATIVE
Glucose, UA: NEGATIVE mg/dL
KETONES UR: NEGATIVE mg/dL
LEUKOCYTES UA: NEGATIVE
Nitrite: NEGATIVE
PH: 6 (ref 5.0–8.0)
Protein, ur: 30 mg/dL — AB
SPECIFIC GRAVITY, URINE: 1.018 (ref 1.005–1.030)

## 2017-03-16 LAB — CBC
HCT: 48.8 % (ref 40.0–52.0)
HEMOGLOBIN: 16.2 g/dL (ref 13.0–18.0)
MCH: 27.9 pg (ref 26.0–34.0)
MCHC: 33.1 g/dL (ref 32.0–36.0)
MCV: 84.4 fL (ref 80.0–100.0)
Platelets: 244 10*3/uL (ref 150–440)
RBC: 5.79 MIL/uL (ref 4.40–5.90)
RDW: 14.1 % (ref 11.5–14.5)
WBC: 16.3 10*3/uL — AB (ref 3.8–10.6)

## 2017-03-16 MED ORDER — SODIUM CHLORIDE 0.9 % IV BOLUS (SEPSIS)
1000.0000 mL | Freq: Once | INTRAVENOUS | Status: AC
  Administered 2017-03-16: 1000 mL via INTRAVENOUS

## 2017-03-16 MED ORDER — MORPHINE SULFATE (PF) 4 MG/ML IV SOLN
4.0000 mg | INTRAVENOUS | Status: DC | PRN
  Administered 2017-03-16 – 2017-03-17 (×2): 4 mg via INTRAVENOUS
  Filled 2017-03-16 (×2): qty 1

## 2017-03-16 MED ORDER — ONDANSETRON HCL 4 MG/2ML IJ SOLN
4.0000 mg | Freq: Once | INTRAMUSCULAR | Status: AC
  Administered 2017-03-16: 4 mg via INTRAVENOUS
  Filled 2017-03-16: qty 2

## 2017-03-16 NOTE — ED Triage Notes (Signed)
Patient with complaint of right lower back pain radiating to his right abdomen down into his groin that started around 18:00 tonight. Patient states that he has had nausea but no vomiting. Patient denies urinary symptoms.

## 2017-03-16 NOTE — ED Provider Notes (Signed)
[facility] Emergency Department Provider Note   First MD Initiated Contact with Patient 03/16/17 2256     (approximate)  I have reviewed the triage vital signs and the nursing notes.   HISTORY  Chief Complaint Flank Pain   HPI Jimmy A Tamaj Jurgens. is a 71 y.o. male presents to the emergency department with acute onset of 10 out of 10 right flank pain which began at 6:00 PM this evening while "mowing grass. Patient also admits to nausea and one episode of vomiting. Patient denies any fever. Patient denies any hematuria or dysuria. Patient denies any constipation or diarrhea. Patient does admit to previous episodes of pounding fundus 2 as well as "they also said had a small stone". Patient denies any aggravating or alleviating factors for his pain.   Past Medical History:  Diagnosis Date  . Anxiety and depression   . Frequency   . Gallstones 03/05/2013  . Kidney problem   May  2013  . OAB (overactive bladder)     Patient Active Problem List   Diagnosis Date Noted  . Elevated hemoglobin A1c 12/02/2015  . Urge incontinence 11/25/2015  . Urinary frequency 10/21/2015  . OAB (overactive bladder) 09/23/2015  . Frequency 09/18/2015  . Gallstones 03/05/2013    Past Surgical History:  Procedure Laterality Date  . HAND SURGERY Bilateral   . HERNIA REPAIR     x 2  . NOSE SURGERY      Prior to Admission medications   Medication Sig Start Date End Date Taking? Authorizing Provider  amLODipine (NORVASC) 10 MG tablet Take 10 mg by mouth. Reported on 09/11/2015    [provider]  cholecalciferol (VITAMIN D) 400 UNITS TABS tablet Take 400 Units by mouth daily.    [provider]  cilostazol (PLETAL) 50 MG tablet Take 50 mg by mouth 2 (two) times daily.    [provider]  Cyanocobalamin (B-12) 2500 MCG TABS Take 1 tablet by mouth daily.    [provider]  glucosamine-chondroitin 500-400 MG tablet Take 1 tablet by mouth 2  (two) times daily.    [provider]  glucose blood test strip 1 each by Other route as needed for other. Reported on 09/11/2015    [provider]  hyoscyamine (LEVSIN, ANASPAZ) 0.125 MG tablet Take 0.125 mg by mouth every 4 (four) hours as needed. Reported on 11/18/2015    [provider]  Multiple Vitamins-Minerals (MULTIVITAMIN WITH MINERALS) tablet Take 1 tablet by mouth daily. Reported on 09/11/2015    [provider]  ondansetron (ZOFRAN ODT) 4 MG disintegrating tablet Take 1 tablet (4 mg total) by mouth every 8 (eight) hours as needed for nausea or vomiting. 03/17/17   Gregor Hams, MD  oxyCODONE-acetaminophen (ROXICET) 5-325 MG tablet Take 1 tablet by mouth every 4 (four) hours as needed for severe pain. 03/17/17   Gregor Hams, MD  pyridoxine (B-6) 100 MG tablet Take 100 mg by mouth daily.    [provider]  sertraline (ZOLOFT) 25 MG tablet Take 25 mg by mouth 2 (two) times daily.    [provider]    Allergies Bee venom and Penicillins  Family History  Problem Relation Age of Onset  . COPD Mother   . Heart disease Mother   . Cancer Father        Brain tumor  . Lung cancer Paternal Uncle   . Cancer - Other Maternal Grandmother  Back  . Kidney disease Neg Hx   . Prostate cancer Neg Hx     Social History Social History  Substance Use Topics  . Smoking status: Never Smoker  . Smokeless tobacco: Current User    Types: Chew  . Alcohol use No    Review of Systems Constitutional: No fever/chills Eyes: No visual changes. ENT: No sore throat. Cardiovascular: Denies chest pain. Respiratory: Denies shortness of breath. Gastrointestinal: Positive for right flank/right groin pain  No nausea, no vomiting.  No diarrhea.  No constipation. Genitourinary: Negative for dysuria. Musculoskeletal: Negative for neck pain.  Positive for right flank pain Integumentary: Negative for rash. Neurological: Negative for  headaches, focal weakness or numbness.  ____________________________________________   PHYSICAL EXAM:  VITAL SIGNS: ED Triage Vitals  Enc Vitals Group     BP 03/16/17 2238 (!) 184/101     Pulse Rate 03/16/17 2237 62     Resp 03/16/17 2237 18     Temp 03/16/17 2237 97.7 F (36.5 C)     Temp Source 03/16/17 2237 Oral     SpO2 --      Weight 03/16/17 2237 (!) 147 kg (324 lb)     Height 03/16/17 2237 1.803 m (5\' 11" )     Head Circumference --      Peak Flow --      Pain Score 03/16/17 2236 10     Pain Loc --      Pain Edu? --      Excl. in [place]? --     Constitutional: Alert and oriented.Apparent discomfort Eyes: Conjunctivae are normal.  Head: Atraumatic. Mouth/Throat: Mucous membranes are moist. Neck: No stridor.   Cardiovascular: Normal rate, regular rhythm. Good peripheral circulation. Grossly normal heart sounds. Respiratory: Normal respiratory effort.  No retractions. Lungs CTAB. Gastrointestinal: Soft and nontender. No distention.  Musculoskeletal: No lower extremity tenderness nor edema. No gross deformities of extremities. Neurologic:  Normal speech and language. No gross focal neurologic deficits are appreciated.  Skin:  Skin is warm, dry and intact. No rash noted. Psychiatric: Mood and affect are normal. Speech and behavior are normal.  ____________________________________________   LABS (all labs ordered are listed, but only abnormal results are displayed)  Labs Reviewed  URINALYSIS, COMPLETE (UACMP) WITH MICROSCOPIC - Abnormal; Notable for the following:       Result Value   Color, Urine YELLOW (*)    APPearance CLEAR (*)    Hgb urine dipstick SMALL (*)    Protein, ur 30 (*)    Squamous Epithelial / LPF 0-5 (*)    All other components within normal limits  BASIC METABOLIC PANEL - Abnormal; Notable for the following:    Glucose, Bld 137 (*)    Creatinine, Ser 1.25 (*)    GFR calc non Af Amer 56 (*)    All other components within normal limits  CBC -  Abnormal; Notable for the following:    WBC 16.3 (*)    All other components within normal limits      Procedures   ____________________________________________   INITIAL IMPRESSION / ASSESSMENT AND PLAN / ED COURSE  Pertinent labs & imaging results that were available during my care of the patient were reviewed by me and considered in my medical decision making (see chart for details).  Patient given IV morphine and Zofran with improvement of pain hour pain reoccurred. History of physical exam concern for possible kidney stone which is confirmed on CT renal study. Patient then given additional dose of  morphine and Toradol with resolution of pain. Spoke with the patient and his wife at length regarding the necessity of following up with the urologist. In addition discussed with the patient and his wife that it is unsafe to drive while taking Percocet which she will be prescribed for home.      ____________________________________________  FINAL CLINICAL IMPRESSION(S) / ED DIAGNOSES  Final diagnoses:  Right kidney stone     MEDICATIONS GIVEN DURING THIS VISIT:  Medications  morphine 4 MG/ML injection 4 mg (4 mg Intravenous Given 03/17/17 0014)  ondansetron (ZOFRAN) injection 4 mg (4 mg Intravenous Given 03/16/17 2301)  sodium chloride 0.9 % bolus 1,000 mL (0 mLs Intravenous Stopped 03/17/17 0223)  ketorolac (TORADOL) 30 MG/ML injection 30 mg (30 mg Intravenous Given 03/17/17 0030)  morphine 4 MG/ML injection 4 mg (4 mg Intravenous Given 03/17/17 0030)  oxyCODONE-acetaminophen (PERCOCET/ROXICET) 5-325 MG per tablet 1 tablet (1 tablet Oral Given 03/17/17 0224)     NEW OUTPATIENT MEDICATIONS STARTED DURING THIS VISIT:  Discharge Medication List as of 03/17/2017  2:14 AM    START taking these medications   Details  ondansetron (ZOFRAN ODT) 4 MG disintegrating tablet Take 1 tablet (4 mg total) by mouth every 8 (eight) hours as needed for nausea or vomiting., Starting Fri  03/17/2017, Print    oxyCODONE-acetaminophen (ROXICET) 5-325 MG tablet Take 1 tablet by mouth every 4 (four) hours as needed for severe pain., Starting Fri 03/17/2017, Print        Discharge Medication List as of 03/17/2017  2:14 AM      Discharge Medication List as of 03/17/2017  2:14 AM       Note:  This document was prepared using Dragon voice recognition software and may include unintentional dictation errors.    Gregor Hams, MD 03/17/17 [phone]

## 2017-03-17 MED ORDER — MORPHINE SULFATE (PF) 4 MG/ML IV SOLN
4.0000 mg | Freq: Once | INTRAVENOUS | Status: AC
  Administered 2017-03-17: 4 mg via INTRAVENOUS

## 2017-03-17 MED ORDER — KETOROLAC TROMETHAMINE 30 MG/ML IJ SOLN
INTRAMUSCULAR | Status: AC
  Administered 2017-03-17: 30 mg via INTRAVENOUS
  Filled 2017-03-17: qty 1

## 2017-03-17 MED ORDER — OXYCODONE-ACETAMINOPHEN 5-325 MG PO TABS
1.0000 | ORAL_TABLET | Freq: Once | ORAL | Status: AC
  Administered 2017-03-17: 1 via ORAL
  Filled 2017-03-17: qty 1

## 2017-03-17 MED ORDER — ONDANSETRON 4 MG PO TBDP: 4.0000 mg | ORAL_TABLET | Freq: Three times a day (TID) | ORAL | 0 refills | Status: DC | PRN

## 2017-03-17 MED ORDER — KETOROLAC TROMETHAMINE 30 MG/ML IJ SOLN
30.0000 mg | Freq: Once | INTRAMUSCULAR | Status: AC
  Administered 2017-03-17: 30 mg via INTRAVENOUS

## 2017-03-17 MED ORDER — OXYCODONE-ACETAMINOPHEN 5-325 MG PO TABS: 1.0000 | ORAL_TABLET | ORAL | 0 refills | Status: DC | PRN

## 2017-03-22 ENCOUNTER — Other Ambulatory Visit: Payer: Self-pay | Admitting: Nurse Practitioner

## 2017-03-22 ENCOUNTER — Ambulatory Visit
Admission: RE | Admit: 2017-03-22 | Discharge: 2017-03-22 | Disposition: A | Discharge status: Home / Self Care | Payer: PPO | Source: Ambulatory Visit | Attending: Nurse Practitioner | Admitting: Nurse Practitioner

## 2017-03-22 DIAGNOSIS — E782 Mixed hyperlipidemia: Secondary | ICD-10-CM | POA: Diagnosis not present

## 2017-03-22 DIAGNOSIS — N200 Calculus of kidney: Secondary | ICD-10-CM | POA: Diagnosis not present

## 2017-03-22 DIAGNOSIS — Z125 Encounter for screening for malignant neoplasm of prostate: Secondary | ICD-10-CM | POA: Diagnosis not present

## 2017-03-22 DIAGNOSIS — M19012 Primary osteoarthritis, left shoulder: Secondary | ICD-10-CM | POA: Insufficient documentation

## 2017-03-22 DIAGNOSIS — M25521 Pain in right elbow: Secondary | ICD-10-CM | POA: Diagnosis present

## 2017-03-22 DIAGNOSIS — M25512 Pain in left shoulder: Secondary | ICD-10-CM | POA: Diagnosis not present

## 2017-03-22 DIAGNOSIS — S46291A Other injury of muscle, fascia and tendon of other parts of biceps, right arm, initial encounter: Secondary | ICD-10-CM | POA: Diagnosis not present

## 2017-03-22 DIAGNOSIS — R937 Abnormal findings on diagnostic imaging of other parts of musculoskeletal system: Secondary | ICD-10-CM | POA: Diagnosis not present

## 2017-03-22 DIAGNOSIS — I10 Essential (primary) hypertension: Secondary | ICD-10-CM | POA: Diagnosis not present

## 2017-03-22 DIAGNOSIS — I739 Peripheral vascular disease, unspecified: Secondary | ICD-10-CM | POA: Diagnosis not present

## 2017-03-22 DIAGNOSIS — S4992XA Unspecified injury of left shoulder and upper arm, initial encounter: Secondary | ICD-10-CM | POA: Diagnosis not present

## 2017-03-22 DIAGNOSIS — M25421 Effusion, right elbow: Secondary | ICD-10-CM | POA: Diagnosis not present

## 2017-03-22 DIAGNOSIS — M19021 Primary osteoarthritis, right elbow: Secondary | ICD-10-CM | POA: Insufficient documentation

## 2017-03-22 DIAGNOSIS — M25721 Osteophyte, right elbow: Secondary | ICD-10-CM | POA: Diagnosis not present

## 2017-03-22 DIAGNOSIS — Z0001 Encounter for general adult medical examination with abnormal findings: Secondary | ICD-10-CM | POA: Diagnosis not present

## 2017-03-22 DIAGNOSIS — R52 Pain, unspecified: Secondary | ICD-10-CM

## 2017-03-22 DIAGNOSIS — F332 Major depressive disorder, recurrent severe without psychotic features: Secondary | ICD-10-CM | POA: Diagnosis not present

## 2017-03-23 ENCOUNTER — Encounter: Payer: Self-pay | Admitting: Urology

## 2017-03-23 ENCOUNTER — Ambulatory Visit: Payer: PPO | Admitting: Urology

## 2017-03-23 VITALS — BP 146/78 | HR 71 | Ht 71.0 in | Wt 313.2 lb

## 2017-03-23 DIAGNOSIS — N200 Calculus of kidney: Secondary | ICD-10-CM | POA: Diagnosis not present

## 2017-03-23 NOTE — Progress Notes (Signed)
03/23/2017 2:10 PM   Jimmy Norton Jimmy Norton. 09-Apr-1946 [id number]  Referring provider: Lavera Guise, [place], [place] [address]  Chief Complaint  Patient presents with  . Nephrolithiasis    HPI: The patient is a 71 year old gentleman with a past medical history of OAB he presents today for evaluation of nephrolithiasis. On review of his CT imaging, he does have a right proximal 4 mm ureteral calculus as well as a right 9 mm lower pole nonobstructing calculus.  He currently is not experiencing any pain. He has been taken Advil when he has had pain. Most recently a few days ago. This diagnosis was made approximately one week ago. He has never had stone before. He has not passed a stone in his knowledge though he has not been straining his urine. No fevers or chills. Background OAB history: He failed at least 3 overactive bladder medications including [place]patrick. Cystoscopy showed bilateral lateral lobe hypertrophy with minimal median lobe. He also failed Flomax. Marland Kitchen Urodynamics: The patient was noted to have positive instability with involuntary voiding. He was able to void voluntarily ago. He did have a question of diverticulum in his bladder. His PVR was negligible. His maximum flow rate was 14 mi./s. His detrusor pressure at maximum flow was 33 cm of water with severe urinary leakage from an unstable bladder contraction.   He underwent 12 cycles of PTNS with noted improvement. He is not currently on medications for this problem.   PMH: Past Medical History:  Diagnosis Date  . Anxiety and depression   . Frequency   . Gallstones 03/05/2013  . Kidney problem   May  2013  . OAB (overactive bladder)     Surgical History: Past Surgical History:  Procedure Laterality Date  . HAND SURGERY Bilateral   . HERNIA REPAIR     x 2  . NOSE SURGERY      Home Medications:  Allergies as of 03/23/2017      Reactions   Bee Venom Anaphylaxis   Penicillins Rash      Medication  List       Accurate as of 03/23/17  2:10 PM. Always use your most recent med list.          amLODipine 10 MG tablet Commonly known as:  NORVASC Take 10 mg by mouth. Reported on 09/11/2015   B-12 2500 MCG Tabs Take 1 tablet by mouth daily.   cholecalciferol 400 units Tabs tablet Commonly known as:  VITAMIN D Take 400 Units by mouth daily.   cilostazol 50 MG tablet Commonly known as:  PLETAL Take 50 mg by mouth 2 (two) times daily.   glucosamine-chondroitin 500-400 MG tablet Take 1 tablet by mouth 2 (two) times daily.   glucose blood test strip 1 each by Other route as needed for other. Reported on 09/11/2015   hyoscyamine 0.125 MG tablet Commonly known as:  LEVSIN, ANASPAZ Take 0.125 mg by mouth every 4 (four) hours as needed. Reported on 11/18/2015   multivitamin with minerals tablet Take 1 tablet by mouth daily. Reported on 09/11/2015   ondansetron 4 MG disintegrating tablet Commonly known as:  ZOFRAN ODT Take 1 tablet (4 mg total) by mouth every 8 (eight) hours as needed for nausea or vomiting.   oxyCODONE-acetaminophen 5-325 MG tablet Commonly known as:  ROXICET Take 1 tablet by mouth every 4 (four) hours as needed for severe pain.   pyridoxine 100 MG tablet Commonly known as:  B-6 Take 100 mg by  mouth daily.   sertraline 25 MG tablet Commonly known as:  ZOLOFT Take 25 mg by mouth 2 (two) times daily.            Discharge Care Instructions        Start     Ordered   03/23/17 0000  Abdomen 1 view (KUB)    Question Answer Comment  Reason for Exam (SYMPTOM  OR DIAGNOSIS REQUIRED) kidney stonesd   Preferred imaging location? [place]   Radiology Contrast Protocol - do NOT remove file path [email or web address]      03/23/17 1408      Allergies:  Allergies  Allergen Reactions  . Bee Venom Anaphylaxis  . Penicillins Rash    Family History: Family History  Problem Relation Age of Onset  . COPD  Mother   . Heart disease Mother   . Cancer Father        Brain tumor  . Lung cancer Paternal Uncle   . Cancer - Other Maternal Grandmother        Back  . Kidney disease Neg Hx   . Prostate cancer Neg Hx     Social History:  reports that he has never smoked. His smokeless tobacco use includes Chew. He reports that he does not drink alcohol or use drugs.  ROS: UROLOGY Frequent Urination?: No Hard to postpone urination?: No Burning/pain with urination?: No Get up at night to urinate?: No Leakage of urine?: No Urine stream starts and stops?: No Trouble starting stream?: No Do you have to strain to urinate?: No Blood in urine?: No Urinary tract infection?: No Sexually transmitted disease?: No Injury to kidneys or bladder?: No Painful intercourse?: No Weak stream?: No Erection problems?: No Penile pain?: No  Gastrointestinal Nausea?: No Vomiting?: No Indigestion/heartburn?: No Diarrhea?: No Constipation?: No  Constitutional Fever: No Night sweats?: No Weight loss?: No Fatigue?: Yes  Skin Skin rash/lesions?: No Itching?: No  Eyes Blurred vision?: Yes Double vision?: No  Ears/Nose/Throat Sore throat?: No Sinus problems?: No  Hematologic/Lymphatic Swollen glands?: No Easy bruising?: No  Cardiovascular Leg swelling?: No Chest pain?: No  Respiratory Cough?: Yes Shortness of breath?: No  Endocrine Excessive thirst?: Yes  Musculoskeletal Back pain?: Yes Joint pain?: Yes  Neurological Headaches?: No Dizziness?: No  Psychologic Depression?: Yes Anxiety?: Yes  Physical Exam: BP (!) 146/78 (BP Location: Left Arm, Patient Position: Sitting, Cuff Size: Large)   Pulse 71   Ht 5\' 11"  (1.803 m)   Wt (!) 313 lb 3.2 oz (142.1 kg)   BMI 43.68 kg/m   Constitutional:  Alert and oriented, No acute distress. HEENT: [place] AT, moist mucus membranes.  Trachea midline, no masses. Cardiovascular: No clubbing, cyanosis, or edema. Respiratory: Normal respiratory  effort, no increased work of breathing. GI: Abdomen is soft, nontender, nondistended, no abdominal masses GU: No CVA tenderness.  Skin: No rashes, bruises or suspicious lesions. Lymph: No cervical or inguinal adenopathy. Neurologic: Grossly intact, no focal deficits, moving all 4 extremities. Psychiatric: Normal mood and affect.  Laboratory Data: Lab Results  Component Value Date   WBC 16.3 (H) 03/16/2017   HGB 16.2 03/16/2017   HCT 48.8 03/16/2017   MCV 84.4 03/16/2017   PLT 244 03/16/2017    Lab Results  Component Value Date   CREATININE 1.25 (H) 03/16/2017    No results found for: PSA  No results found for: TESTOSTERONE  Lab Results  Component Value Date   HGBA1C 6.1 (H) 11/25/2015    Urinalysis    Component  Value Date/Time   COLORURINE YELLOW (A) 03/16/2017 2300   APPEARANCEUR CLEAR (A) 03/16/2017 2300   APPEARANCEUR Cloudy (A) 11/25/2015 0936   LABSPEC 1.018 03/16/2017 2300   LABSPEC 1.018 12/29/2013 1342   PHURINE 6.0 03/16/2017 2300   GLUCOSEU NEGATIVE 03/16/2017 2300   GLUCOSEU Negative 12/29/2013 1342   HGBUR SMALL (A) 03/16/2017 2300   BILIRUBINUR NEGATIVE 03/16/2017 2300   BILIRUBINUR Negative 11/25/2015 0936   BILIRUBINUR Negative 12/29/2013 1342   KETONESUR NEGATIVE 03/16/2017 2300   PROTEINUR 30 (A) 03/16/2017 2300   NITRITE NEGATIVE 03/16/2017 2300   LEUKOCYTESUR NEGATIVE 03/16/2017 2300   LEUKOCYTESUR Negative 11/25/2015 0936   LEUKOCYTESUR 3+ 12/29/2013 1342    Pertinent Imaging: CT reviewed as above  Assessment & Plan:    1. Right ureteral stone 2. Right nonobstructing lower pole stone I discussed the patient management options which include medical expulsive therapy, lithotripsy, and ureteroscopy. This point since he is asymptomatic is elected to proceed with medical expulsive therapy. I have advised him to start his terazosin which has been prescribed recently at the [facility] for his BPH to help with stone passage. He was also given a  strainer. He will follow-up in 2 weeks with KUB prior per  Return in about 2 weeks (around 04/06/2017) for KUB prior.  Nickie Retort, MD  [facility] [address] [place], [place] [address] [phone]

## 2017-03-24 DIAGNOSIS — G4733 Obstructive sleep apnea (adult) (pediatric): Secondary | ICD-10-CM | POA: Diagnosis not present

## 2017-03-24 DIAGNOSIS — R0902 Hypoxemia: Secondary | ICD-10-CM | POA: Diagnosis not present

## 2017-03-29 DIAGNOSIS — G4733 Obstructive sleep apnea (adult) (pediatric): Secondary | ICD-10-CM | POA: Diagnosis not present

## 2017-04-06 ENCOUNTER — Ambulatory Visit (INDEPENDENT_AMBULATORY_CARE_PROVIDER_SITE_OTHER): Payer: PPO | Admitting: Urology

## 2017-04-06 ENCOUNTER — Encounter: Payer: Self-pay | Admitting: Urology

## 2017-04-06 ENCOUNTER — Ambulatory Visit
Admission: RE | Admit: 2017-04-06 | Discharge: 2017-04-06 | Disposition: A | Discharge status: Home / Self Care | Payer: PPO | Source: Ambulatory Visit | Attending: Urology | Admitting: Urology

## 2017-04-06 VITALS — BP 136/70 | HR 71 | Ht 71.0 in | Wt 316.5 lb

## 2017-04-06 DIAGNOSIS — N200 Calculus of kidney: Secondary | ICD-10-CM | POA: Diagnosis not present

## 2017-04-06 NOTE — Progress Notes (Signed)
04/06/2017 9:50 AM   Jimmy Norton. [birth year] [id number]  Referring provider: Lavera Guise, [place], [place] [address]  Chief Complaint  Patient presents with  . Nephrolithiasis    HPI: The patient is a 71 year old gentleman with a past medical history of OAB he presents today for follow up of nephrolithiasis. On review of his CT imaging from March 16, 2017, he does have a right proximal 4 mm ureteral calculus as well as a right 9 mm lower pole nonobstructing calculus. KUB shows no obvious stone today. Patient remains asymptomatic. He has not been straining his urine. He's had no pain since he went to the ER before he was last seen. He has no nausea or vomiting. Denies fevers or chills.  Background OAB history: He failed at least 3 overactive bladder medications including Myrbetriq. Cystoscopy showed bilateral lateral lobe hypertrophy with minimal median lobe. He also failed Flomax. Marland Kitchen Urodynamics: The patient was noted to have positive instability with involuntary voiding. He was able to void voluntarily ago. He did have a question of diverticulum in his bladder. His PVR was negligible. His maximum flow rate was 14 mi./s. His detrusor pressure at maximum flow was 33 cm of water with severe urinary leakage from an unstable bladder contraction.   He underwent 12 cycles of PTNS with noted improvement. He is not currently on medications for this problem.     PMH: Past Medical History:  Diagnosis Date  . Anxiety and depression   . Frequency   . Gallstones 03/05/2013  . Kidney problem   May  2013  . OAB (overactive bladder)     Surgical History: Past Surgical History:  Procedure Laterality Date  . HAND SURGERY Bilateral   . HERNIA REPAIR     x 2  . NOSE SURGERY      Home Medications:  Allergies as of 04/06/2017      Reactions   Bee Venom Anaphylaxis   Penicillins Rash      Medication List       Accurate as of 04/06/17  9:50 AM. Always use your  most recent med list.          B-12 2500 MCG Tabs Take 1 tablet by mouth daily.   cholecalciferol 400 units Tabs tablet Commonly known as:  VITAMIN D Take 400 Units by mouth daily.   FLUoxetine 10 MG capsule Commonly known as:  PROZAC Take 10 mg by mouth daily.   glucosamine-chondroitin 500-400 MG tablet Take 1 tablet by mouth 2 (two) times daily.   losartan 25 MG tablet Commonly known as:  COZAAR Take 25 mg by mouth daily.   multivitamin with minerals tablet Take 1 tablet by mouth daily. Reported on 09/11/2015   pyridoxine 100 MG tablet Commonly known as:  B-6 Take 100 mg by mouth daily.   sertraline 25 MG tablet Commonly known as:  ZOLOFT Take 25 mg by mouth 2 (two) times daily.   terazosin 10 MG capsule Commonly known as:  HYTRIN Take 10 mg by mouth at bedtime.       Allergies:  Allergies  Allergen Reactions  . Bee Venom Anaphylaxis  . Penicillins Rash    Family History: Family History  Problem Relation Age of Onset  . COPD Mother   . Heart disease Mother   . Cancer Father        Brain tumor  . Lung cancer Paternal Uncle   . Cancer - Other Maternal Grandmother  Back  . Kidney disease Neg Hx   . Prostate cancer Neg Hx     Social History:  reports that he has never smoked. His smokeless tobacco use includes Chew. He reports that he does not drink alcohol or use drugs.  ROS: UROLOGY Frequent Urination?: Yes Hard to postpone urination?: Yes Burning/pain with urination?: No Get up at night to urinate?: Yes Leakage of urine?: Yes Urine stream starts and stops?: No Trouble starting stream?: No Do you have to strain to urinate?: No Blood in urine?: No Urinary tract infection?: No Sexually transmitted disease?: No Injury to kidneys or bladder?: No Painful intercourse?: No Weak stream?: No Erection problems?: Yes Penile pain?: No  Gastrointestinal Nausea?: No Vomiting?: No Indigestion/heartburn?: No Diarrhea?: No Constipation?:  No  Constitutional Fever: No Night sweats?: No Weight loss?: Yes Fatigue?: Yes  Skin Skin rash/lesions?: Yes Itching?: No  Eyes Blurred vision?: Yes Double vision?: Yes  Ears/Nose/Throat Sore throat?: No Sinus problems?: No  Hematologic/Lymphatic Swollen glands?: No Easy bruising?: No  Cardiovascular Leg swelling?: Yes Chest pain?: Yes  Respiratory Cough?: Yes Shortness of breath?: Yes  Endocrine Excessive thirst?: Yes  Musculoskeletal Back pain?: Yes Joint pain?: Yes  Neurological Headaches?: Yes Dizziness?: Yes  Psychologic Depression?: Yes Anxiety?: Yes  Physical Exam: BP 136/70 (BP Location: Left Arm, Patient Position: Sitting, Cuff Size: Large)   Pulse 71   Ht 5\' 11"  (1.803 m)   Wt (!) 316 lb 8 oz (143.6 kg)   BMI 44.14 kg/m   Constitutional:  Alert and oriented, No acute distress. HEENT: [place] AT, moist mucus membranes.  Trachea midline, no masses. Cardiovascular: No clubbing, cyanosis, or edema. Respiratory: Normal respiratory effort, no increased work of breathing. GI: Abdomen is soft, nontender, nondistended, no abdominal masses GU: No CVA tenderness.  Skin: No rashes, bruises or suspicious lesions. Lymph: No cervical or inguinal adenopathy. Neurologic: Grossly intact, no focal deficits, moving all 4 extremities. Psychiatric: Normal mood and affect.  Laboratory Data: Lab Results  Component Value Date   WBC 16.3 (H) 03/16/2017   HGB 16.2 03/16/2017   HCT 48.8 03/16/2017   MCV 84.4 03/16/2017   PLT 244 03/16/2017    Lab Results  Component Value Date   CREATININE 1.25 (H) 03/16/2017    No results found for: PSA  No results found for: TESTOSTERONE  Lab Results  Component Value Date   HGBA1C 6.1 (H) 11/25/2015    Urinalysis    Component Value Date/Time   COLORURINE YELLOW (A) 03/16/2017 2300   APPEARANCEUR CLEAR (A) 03/16/2017 2300   APPEARANCEUR Cloudy (A) 11/25/2015 0936   LABSPEC 1.018 03/16/2017 2300   LABSPEC 1.018  12/29/2013 1342   PHURINE 6.0 03/16/2017 2300   GLUCOSEU NEGATIVE 03/16/2017 2300   GLUCOSEU Negative 12/29/2013 1342   HGBUR SMALL (A) 03/16/2017 2300   BILIRUBINUR NEGATIVE 03/16/2017 2300   BILIRUBINUR Negative 11/25/2015 0936   BILIRUBINUR Negative 12/29/2013 1342   KETONESUR NEGATIVE 03/16/2017 2300   PROTEINUR 30 (A) 03/16/2017 2300   NITRITE NEGATIVE 03/16/2017 2300   LEUKOCYTESUR NEGATIVE 03/16/2017 2300   LEUKOCYTESUR Negative 11/25/2015 0936   LEUKOCYTESUR 3+ 12/29/2013 1342    Pertinent Imaging: KUB revealed as above.  Assessment & Plan:    1. Nephrolithiasis I discussed the patient the symptoms and x-ray suggested he has passed the stone. At this time, he can follow up with [place] as needed. If his pain did return, but we would have to consider a renal ultrasound that time.  Return if symptoms worsen or  fail to improve.  Nickie Retort, MD  [facility] [address], [place], [place] [address] [phone]

## 2017-05-04 DIAGNOSIS — E782 Mixed hyperlipidemia: Secondary | ICD-10-CM | POA: Diagnosis not present

## 2017-05-04 DIAGNOSIS — I10 Essential (primary) hypertension: Secondary | ICD-10-CM | POA: Diagnosis not present

## 2017-05-04 DIAGNOSIS — G4733 Obstructive sleep apnea (adult) (pediatric): Secondary | ICD-10-CM | POA: Diagnosis not present

## 2017-05-04 DIAGNOSIS — F332 Major depressive disorder, recurrent severe without psychotic features: Secondary | ICD-10-CM | POA: Diagnosis not present

## 2017-05-04 DIAGNOSIS — Z23 Encounter for immunization: Secondary | ICD-10-CM | POA: Diagnosis not present

## 2017-07-28 ENCOUNTER — Encounter: Payer: Self-pay | Admitting: Nurse Practitioner

## 2017-07-28 ENCOUNTER — Ambulatory Visit: Payer: Medicare HMO | Admitting: Nurse Practitioner

## 2017-07-28 VITALS — BP 134/86 | HR 66 | Resp 16 | Ht 70.0 in | Wt 319.8 lb

## 2017-07-28 DIAGNOSIS — Z0001 Encounter for general adult medical examination with abnormal findings: Secondary | ICD-10-CM | POA: Diagnosis not present

## 2017-07-28 DIAGNOSIS — K582 Mixed irritable bowel syndrome: Secondary | ICD-10-CM

## 2017-07-28 DIAGNOSIS — R0902 Hypoxemia: Secondary | ICD-10-CM | POA: Insufficient documentation

## 2017-07-28 DIAGNOSIS — E291 Testicular hypofunction: Secondary | ICD-10-CM | POA: Insufficient documentation

## 2017-07-28 DIAGNOSIS — L209 Atopic dermatitis, unspecified: Secondary | ICD-10-CM

## 2017-07-28 DIAGNOSIS — N200 Calculus of kidney: Secondary | ICD-10-CM | POA: Insufficient documentation

## 2017-07-28 DIAGNOSIS — I10 Essential (primary) hypertension: Secondary | ICD-10-CM | POA: Insufficient documentation

## 2017-07-28 DIAGNOSIS — E782 Mixed hyperlipidemia: Secondary | ICD-10-CM | POA: Insufficient documentation

## 2017-07-28 DIAGNOSIS — N401 Enlarged prostate with lower urinary tract symptoms: Secondary | ICD-10-CM

## 2017-07-28 DIAGNOSIS — J301 Allergic rhinitis due to pollen: Secondary | ICD-10-CM | POA: Insufficient documentation

## 2017-07-28 DIAGNOSIS — R079 Chest pain, unspecified: Secondary | ICD-10-CM | POA: Insufficient documentation

## 2017-07-28 DIAGNOSIS — M25572 Pain in left ankle and joints of left foot: Secondary | ICD-10-CM | POA: Insufficient documentation

## 2017-07-28 DIAGNOSIS — G4733 Obstructive sleep apnea (adult) (pediatric): Secondary | ICD-10-CM

## 2017-07-28 DIAGNOSIS — M1990 Unspecified osteoarthritis, unspecified site: Secondary | ICD-10-CM

## 2017-07-28 DIAGNOSIS — F331 Major depressive disorder, recurrent, moderate: Secondary | ICD-10-CM | POA: Insufficient documentation

## 2017-07-28 MED ORDER — TERAZOSIN HCL 10 MG PO CAPS: 10.0000 mg | ORAL_CAPSULE | Freq: Every day | ORAL | 5 refills | Status: DC

## 2017-07-28 MED ORDER — TRIAMCINOLONE ACETONIDE 0.025 % EX OINT: 1.0000 "application " | TOPICAL_OINTMENT | Freq: Two times a day (BID) | CUTANEOUS | 3 refills | Status: DC

## 2017-07-28 NOTE — Progress Notes (Signed)
[facility] [place], [place] [address]  Internal MEDICINE  Office Visit Note  Patient Name: Jimmy Norton  [id number]  [id number]  Date of Service: 07/28/2017     Complaints/HPI Pt is here for routine follow up.  The patient is here for routine wellness visit.He is having some issues with generalized itching. Has noted some patchy areas of rash on the right lower leg and the abdomen. States that he recently started using [place] soap and itchiness started after the change in soap.  Patient admits that he had not been taking his medication for bp or depression and started taking this all again about a week ago.  He has seen his provider at [facility] and brought updated reports of labs and vaccinations.  States that overall he is feeling well.     Current Medication: Outpatient Encounter Medications as of 07/28/2017  Medication Sig  . cholecalciferol (VITAMIN D) 400 UNITS TABS tablet Take 400 Units by mouth daily.  . Cyanocobalamin (B-12) 2500 MCG TABS Take 1 tablet by mouth daily.  Marland Kitchen FLUoxetine (PROZAC) 10 MG capsule Take 10 mg by mouth daily.  Marland Kitchen losartan (COZAAR) 25 MG tablet Take 25 mg by mouth daily.  . Misc Natural Products (OSTEO BI-FLEX/5-LOXIN ADVANCED) TABS Take by mouth daily.  . Multiple Vitamins-Minerals (MULTIVITAMIN WITH MINERALS) tablet Take 1 tablet by mouth daily. Reported on 09/11/2015  . terazosin (HYTRIN) 10 MG capsule Take 10 mg by mouth at bedtime.  Marland Kitchen glucosamine-chondroitin 500-400 MG tablet Take 1 tablet by mouth 2 (two) times daily.  Marland Kitchen pyridoxine (B-6) 100 MG tablet Take 100 mg by mouth daily.  . sertraline (ZOLOFT) 25 MG tablet Take 25 mg by mouth 2 (two) times daily.   Facility-Administered Encounter Medications as of 07/28/2017  Medication  . triamcinolone acetonide (KENALOG) 10 MG/ML injection 10 mg    Surgical History: Past Surgical History:  Procedure Laterality Date  . HAND SURGERY Bilateral   . HERNIA REPAIR     x 2    . NOSE SURGERY      Medical History: Past Medical History:  Diagnosis Date  . Anxiety and depression   . Frequency   . Gallstones 03/05/2013  . Kidney problem   May  2013  . OAB (overactive bladder)     Family History: Family History  Problem Relation Age of Onset  . COPD Mother   . Heart disease Mother   . Cancer Father        Brain tumor  . Lung cancer Paternal Uncle   . Cancer - Other Maternal Grandmother        Back  . Kidney disease Neg Hx   . Prostate cancer Neg Hx     Social History   Socioeconomic History  . Marital status: Married    Spouse name: Not on file  . Number of children: Not on file  . Years of education: Not on file  . Highest education level: Not on file  Social Needs  . Financial resource strain: Not on file  . Food insecurity - worry: Not on file  . Food insecurity - inability: Not on file  . Transportation needs - medical: Not on file  . Transportation needs - non-medical: Not on file  Occupational History  . Not on file  Tobacco Use  . Smoking status: Former Research scientist (life sciences)  . Smokeless tobacco: Current User    Types: Chew  Substance and Sexual Activity  . Alcohol use: No  Alcohol/week: 0.0 oz  . Drug use: No  . Sexual activity: Not on file  Other Topics Concern  . Not on file  Social History Narrative  . Not on file      Review of Systems  Constitutional: Positive for fatigue.       Has had weight loss of 2 pounds since most recent visit. States that he has had to tighten his belt three holes to keep up his pants.   HENT: Negative for rhinorrhea, sore throat and voice change.   Eyes: Negative.   Respiratory: Negative for chest tightness, shortness of breath and wheezing.   Cardiovascular: Negative for chest pain and palpitations.  Gastrointestinal: Positive for constipation and diarrhea.       Rectal pressure especially when he has to urinate.   Endocrine: Negative.   Genitourinary: Positive for frequency and urgency.   Musculoskeletal: Positive for arthralgias and back pain.  Skin: Positive for rash.  Neurological: Positive for headaches. Negative for dizziness.       History of migraines. Has had a few off and on, but nothing out of the ordinary.  Hematological: Negative.   Psychiatric/Behavioral: Negative for suicidal ideas.       Depression    Today's Vitals   07/28/17 0903  BP: 134/86  Pulse: 66  Resp: 16  SpO2: 97%  Weight: (!) 319 lb 12.8 oz (145.1 kg)  Height: 5\' 10"  (1.778 m)     Physical Exam  Constitutional: He is oriented to person, place, and time. He appears well-developed and well-nourished.  HENT:  Head: Normocephalic and atraumatic.  Eyes: Pupils are equal, round, and reactive to light.  Neck: Normal range of motion. Neck supple. No JVD present. Carotid bruit is not present. No thyromegaly present.  Cardiovascular: Normal rate, regular rhythm, normal heart sounds, intact distal pulses and normal pulses.  Pulmonary/Chest: Effort normal and breath sounds normal. He has no wheezes.  Abdominal: Soft. Bowel sounds are increased. There is no hepatosplenomegaly. There is tenderness in the right upper quadrant and periumbilical area. There is no CVA tenderness.    Musculoskeletal:  The patient has generalized joint pain with point tenderness present. No interference in strength or ROM in the joints.   Neurological: He is alert and oriented to person, place, and time. He has normal reflexes.  Skin: Skin is warm and dry. Rash noted.     Psychiatric: He has a normal mood and affect.  Nursing note and vitals reviewed.     Assessment/Plan:    ICD-10-CM   1. Encounter for health maintenance examination with abnormal findings Z00.01 Urinalysis, Routine w reflex microscopic  2. Essential (primary) hypertension I10   3. Benign prostatic hyperplasia with lower urinary tract symptoms, symptom details unspecified N40.1   4. Major depressive disorder, recurrent, moderate (HCC) F33.1    5. Obstructive sleep apnea of adult G47.33   6. Osteoarthritis, unspecified osteoarthritis type, unspecified site M19.90   7. 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 Annual wellness visit today 2. bp stable. Continue bp medication as prescribed  3. BPH - continue terazosin 10mg  daily, as prescribed per urology.  4. Improved on fluoxetine. Continue this at 10mg  daily.  5. Continue to use CPAP as prescribed. Will get appointent with Dr. Devona Konig for further evaluation for new CPAP machine 6. Continue to take OTC NSAIDs or tylenol as needed f or osteoarthritis.  7. Having increased issues with constipation/diarrhea as well as rectal pressure. Will  refer to GI for further evaluation  8. Triamcinolone 0.025% ointment may be applied to affected areas BID as needed. Recommend he keep skin moisturized.  Counseling:  General Counseling: I have discussed the findings of the evaluation and examination with Roberta.  I have also discussed any further diagnostic evaluation that may be needed or ordered today. Roberta verbalizes understanding of the findings of todays visit. We also reviewed her medications today. she has been encouraged to call the office with any questions or concerns that should arise related to todays visit.  This patient was seen by Leretha Pol, FNP- C in Collaboration with Dr Lavera Guise as a part of collaborative care agreement    Time spent: 30 minutes   Dr Lavera Guise Internal medicine

## 2017-07-29 LAB — URINALYSIS, ROUTINE W REFLEX MICROSCOPIC
Bilirubin, UA: NEGATIVE
Glucose, UA: NEGATIVE
KETONES UA: NEGATIVE
LEUKOCYTES UA: NEGATIVE
NITRITE UA: NEGATIVE
PH UA: 5.5 (ref 5.0–7.5)
Protein, UA: NEGATIVE
RBC, UA: NEGATIVE
SPEC GRAV UA: 1.02 (ref 1.005–1.030)
Urobilinogen, Ur: 0.2 mg/dL (ref 0.2–1.0)

## 2017-08-14 ENCOUNTER — Encounter: Payer: Self-pay | Admitting: Gastroenterology

## 2017-08-14 ENCOUNTER — Other Ambulatory Visit: Payer: Self-pay

## 2017-08-14 ENCOUNTER — Other Ambulatory Visit
Admission: RE | Admit: 2017-08-14 | Discharge: 2017-08-14 | Disposition: A | Discharge status: Home / Self Care | Payer: Medicare HMO | Source: Ambulatory Visit | Attending: Gastroenterology | Admitting: Gastroenterology

## 2017-08-14 ENCOUNTER — Ambulatory Visit (INDEPENDENT_AMBULATORY_CARE_PROVIDER_SITE_OTHER): Payer: Medicare HMO | Admitting: Gastroenterology

## 2017-08-14 VITALS — BP 151/92 | HR 79 | Temp 97.8°F | Ht 70.0 in | Wt 322.0 lb

## 2017-08-14 DIAGNOSIS — R194 Change in bowel habit: Secondary | ICD-10-CM | POA: Diagnosis not present

## 2017-08-14 DIAGNOSIS — R152 Fecal urgency: Secondary | ICD-10-CM | POA: Diagnosis not present

## 2017-08-14 LAB — C-REACTIVE PROTEIN

## 2017-08-14 NOTE — Progress Notes (Signed)
Cephas Darby, MD [address]  [place]  [place], [place] [address]  Main: [phone]  Fax: [phone]    Gastroenterology Consultation  Referring Provider:     Ronnell Freshwater, NP Primary Care Physician:  Lavera Guise, MD Primary Gastroenterologist:  Dr. Cephas Darby Reason for Consultation:   Fecal urgency        HPI:   Jimmy A Jahlon Baines. is a 72 y.o. male referred by Dr. Lavera Guise, MD  for consultation & management of fecal urgency. Patient reports approximately 1 year history of fecal urgency whenever he urinates. He also reports leakage associated with lower abdominal cramps, passing flatus. He denies nocturnal diarrhea or incontinence. He reports variable consistency in his bowel movements, anywhere from soft to hard. He is morbidly obese, consumes soda and red meat on a regular basis. He denies losing weight, blood in stools or black stools. He reports having had a colonoscopy within last 5 years, unable to recall the results. He is prescribed hyoscyamine 0.125 and takes it as needed. He has history of sleep apnea, on CPAP. He chews tobacco daily. He had CT renal study protocol in 02/2017 which showed extensive sigmoid diverticulosis.  NSAIDs: None  Antiplts/Anticoagulants/Anti thrombotics: None  GI Procedures: Colonoscopy within 5 years per patient, report not available  Past Medical History:  Diagnosis Date  . Anxiety and depression   . Frequency   . Gallstones 03/05/2013  . Kidney problem   May  2013  . OAB (overactive bladder)     Past Surgical History:  Procedure Laterality Date  . HAND SURGERY Bilateral   . HERNIA REPAIR     x 2  . NOSE SURGERY      Prior to Admission medications   Medication Sig Start Date End Date Taking? Authorizing Provider  amLODipine (NORVASC) 10 MG tablet Take by mouth.    [provider]  cholecalciferol (VITAMIN D) 400 UNITS TABS tablet Take 400 Units by mouth daily.    [provider]    cilostazol (PLETAL) 50 MG tablet Take by mouth.    [provider]  Cyanocobalamin (B-12) 2500 MCG TABS Take 1 tablet by mouth daily.    [provider]  fenofibrate 54 MG tablet Take by mouth.    [provider]  FLUoxetine (PROZAC) 10 MG capsule Take 10 mg by mouth daily.    [provider]  glucosamine-chondroitin 500-400 MG tablet Take 1 tablet by mouth 2 (two) times daily.    [provider]  hyoscyamine (LEVSIN, ANASPAZ) 0.125 MG tablet Take by mouth.    [provider]  losartan (COZAAR) 25 MG tablet Take 25 mg by mouth daily.    [provider]  Misc Natural Products (OSTEO BI-FLEX/5-LOXIN ADVANCED) TABS Take by mouth daily.    [provider]  Multiple Vitamins-Minerals (MULTIVITAMIN WITH MINERALS) tablet Take 1 tablet by mouth daily. Reported on 09/11/2015    [provider]  predniSONE (DELTASONE) 10 MG tablet 6 PO Q D X 1 DAY, THEN 5 PO Q D X 1 DAY, THEN 4 PO Q D X 1 DAY, THEN 3 PO Q D X 1 DAY, THEN 2 PO Q D X 1 DAY, THEN 1 PO Q D X 1 DAY 12/13/16   [provider]  pyridoxine (B-6) 100 MG tablet Take 100 mg by mouth daily.    [provider]  Pyridoxine HCl (B-6) 100 MG TABS Take by mouth.  [provider]  sertraline (ZOLOFT) 25 MG tablet Take 25 mg by mouth 2 (two) times daily.    [provider]  terazosin (HYTRIN) 10 MG capsule Take 1 capsule (10 mg total) by mouth at bedtime. 07/28/17   Ronnell Freshwater, NP  triamcinolone (KENALOG) 0.025 % ointment Apply 1 application topically 2 (two) times daily. 07/28/17   Ronnell Freshwater, NP    Family History  Problem Relation Age of Onset  . COPD Mother   . Heart disease Mother   . Cancer Father        Brain tumor  . Lung cancer Paternal Uncle   . Cancer - Other Maternal Grandmother        Back  . Kidney disease Neg Hx   . Prostate cancer Neg Hx      Social History   Tobacco Use  . Smoking status: Former Research scientist (life sciences)   . Smokeless tobacco: Current User    Types: Chew  Substance Use Topics  . Alcohol use: No    Alcohol/week: 0.0 oz  . Drug use: No    Allergies as of 08/14/2017 - Review Complete 08/14/2017  Allergen Reaction Noted  . Bee venom Anaphylaxis 04/24/2015  . Penicillins Rash 03/05/2013    Review of Systems:    All systems reviewed and negative except where noted in HPI.   Physical Exam:  BP (!) 151/92   Pulse 79   Temp 97.8 F (36.6 C) (Oral)   Ht 5\' 10"  (1.778 m)   Wt (!) 322 lb (146.1 kg)   BMI 46.20 kg/m  No LMP for male patient.  General:   Alert,  Well-developed, well-nourished, pleasant and cooperative in NAD Head:  Normocephalic and atraumatic. Eyes:  Sclera clear, no icterus.   Conjunctiva pink. Ears:  Normal auditory acuity. Nose:  No deformity, discharge, or lesions. Mouth:  No deformity or lesions,oropharynx pink & moist. Neck:  Supple; no masses or thyromegaly. Lungs:  Respirations even and unlabored.  Clear throughout to auscultation.   No wheezes, crackles, or rhonchi. No acute distress. Heart:  Regular rate and rhythm; no murmurs, clicks, rubs, or gallops. Abdomen:  Normal bowel sounds. Soft, mild diffuse lower abdominal tenderness and non-distended without masses, hepatosplenomegaly or hernias noted.  No guarding or rebound tenderness.   Rectal: Not performed Msk:  Symmetrical without gross deformities. Good, equal movement & strength bilaterally. Pulses:  Normal pulses noted. Extremities:  No clubbing or edema.  No cyanosis. Neurologic:  Alert and oriented x3;  grossly normal neurologically. Skin:  Intact without significant lesions or rashes. No jaundice. Lymph Nodes:  No significant cervical adenopathy. Psych:  Alert and cooperative. Normal mood and affect.  Imaging Studies: Reviewed  Assessment and Plan:   Jimmy A Desmund Elman. is a 72 y.o. male with morbid obesity, chronic tobacco chewer, overactive bladder presents with chronic symptoms of fecal  urgency associated with lower abdominal cramps and altered bowel habits, imaging evidence of severe sigmoid diverticulosis. Differentials include ulcerative colitis or segmental colitis associated with diverticulosis or recurrent diverticulitis or pancreatic insufficiency and less likely infectious etiology  - Continue hyoscyamine as needed - Check CRP, pancreatic fecal elastase, GI pathogen panel, stool for C. difficile, CAL protectin levels - We will give an empiric trial of 2 weeks course of Cipro and Flagyl if above workup is negative - We'll try to obtain the previous colonoscopy report from his PCP   Follow up in 4 weeks   Cephas Darby, MD

## 2017-08-16 ENCOUNTER — Other Ambulatory Visit
Admission: RE | Admit: 2017-08-16 | Discharge: 2017-08-16 | Disposition: A | Discharge status: Home / Self Care | Payer: Medicare HMO | Source: Ambulatory Visit | Attending: Gastroenterology | Admitting: Gastroenterology

## 2017-08-16 DIAGNOSIS — R152 Fecal urgency: Secondary | ICD-10-CM | POA: Insufficient documentation

## 2017-08-16 LAB — GASTROINTESTINAL PANEL BY PCR, STOOL (REPLACES STOOL CULTURE)
ASTROVIRUS: NOT DETECTED
Adenovirus F40/41: NOT DETECTED
CAMPYLOBACTER SPECIES: NOT DETECTED
CYCLOSPORA CAYETANENSIS: NOT DETECTED
Cryptosporidium: NOT DETECTED
ENTEROPATHOGENIC E COLI (EPEC): NOT DETECTED
ENTEROTOXIGENIC E COLI (ETEC): NOT DETECTED
Entamoeba histolytica: NOT DETECTED
Enteroaggregative E coli (EAEC): NOT DETECTED
Giardia lamblia: NOT DETECTED
Norovirus GI/GII: NOT DETECTED
PLESIMONAS SHIGELLOIDES: NOT DETECTED
ROTAVIRUS A: NOT DETECTED
SHIGA LIKE TOXIN PRODUCING E COLI (STEC): NOT DETECTED
Salmonella species: NOT DETECTED
Sapovirus (I, II, IV, and V): NOT DETECTED
Shigella/Enteroinvasive E coli (EIEC): NOT DETECTED
VIBRIO SPECIES: NOT DETECTED
Vibrio cholerae: NOT DETECTED
Yersinia enterocolitica: NOT DETECTED

## 2017-08-16 LAB — C DIFFICILE QUICK SCREEN W PCR REFLEX
C DIFFICLE (CDIFF) ANTIGEN: NEGATIVE
C Diff interpretation: NOT DETECTED
C Diff toxin: NEGATIVE

## 2017-08-17 LAB — PANCREATIC ELASTASE, FECAL: Pancreatic Elastase-1, Stool: >500 ug Elast./g (ref >200)

## 2017-08-18 LAB — CALPROTECTIN, FECAL: CALPROTECTIN, FECAL: 42 ug/g (ref 0–120)

## 2017-08-23 ENCOUNTER — Other Ambulatory Visit: Payer: Self-pay

## 2017-08-23 MED ORDER — CIPROFLOXACIN HCL 500 MG PO TABS: 500.0000 mg | ORAL_TABLET | Freq: Two times a day (BID) | ORAL | 0 refills | Status: AC

## 2017-08-23 MED ORDER — METRONIDAZOLE 500 MG PO TABS: 500.0000 mg | ORAL_TABLET | Freq: Two times a day (BID) | ORAL | 0 refills | Status: AC

## 2017-08-23 NOTE — Progress Notes (Signed)
rx sent and patient informed of results/rx at pharmacy//kdc

## 2017-08-24 ENCOUNTER — Ambulatory Visit: Payer: Medicare HMO | Admitting: Internal Medicine

## 2017-08-24 ENCOUNTER — Encounter: Payer: Self-pay | Admitting: Internal Medicine

## 2017-08-24 VITALS — BP 142/78 | HR 73 | Resp 16 | Ht 71.0 in | Wt 325.0 lb

## 2017-08-24 DIAGNOSIS — J301 Allergic rhinitis due to pollen: Secondary | ICD-10-CM | POA: Diagnosis not present

## 2017-08-24 DIAGNOSIS — G4733 Obstructive sleep apnea (adult) (pediatric): Secondary | ICD-10-CM

## 2017-08-24 DIAGNOSIS — Z9989 Dependence on other enabling machines and devices: Secondary | ICD-10-CM | POA: Diagnosis not present

## 2017-08-24 NOTE — Progress Notes (Signed)
[facility] [place], [place] [address]  Pulmonary Sleep Medicine  Office Visit Note  Patient Name: Jimmy Norton. DOB: 1945/09/16 MRN [id number]  Date of Service: 08/24/2017  Complaints/HPI: He has been having some issues with his CPAP device. He has not been doing well as a result. His machine is at least 72 years old. Patient states he has felt tired and also has felt sleepy. His last titration was done in 2015 and at that time he needed a pressure of 9cwp. He is also on oxygen therapy with the CPAP device. He is tired and sleepy. He has had some headahces also. Patient is noted to be snoring too  ROS  General: (-) fever, (-) chills, (-) night sweats, (-) weakness Skin: (-) rashes, (-) itching,. Eyes: (-) visual changes, (-) redness, (-) itching. Nose and Sinuses: (-) nasal stuffiness or itchiness, (-) postnasal drip, (-) nosebleeds, (-) sinus trouble. Mouth and Throat: (-) sore throat, (-) hoarseness. Neck: (-) swollen glands, (-) enlarged thyroid, (-) neck pain. Respiratory: - cough, (-) bloody sputum, - shortness of breath, - wheezing. Cardiovascular: - ankle swelling, (-) chest pain. Lymphatic: (-) lymph node enlargement. Neurologic: (-) numbness, (-) tingling. Psychiatric: (-) anxiety, (-) depression   Current Medication: Outpatient Encounter Medications as of 08/24/2017  Medication Sig  . cholecalciferol (VITAMIN D) 400 UNITS TABS tablet Take 400 Units by mouth daily.  . ciprofloxacin (CIPRO) 500 MG tablet Take 1 tablet (500 mg total) by mouth 2 (two) times daily for 14 days.  . Cyanocobalamin (B-12) 2500 MCG TABS Take 1 tablet by mouth daily.  Marland Kitchen FLUoxetine (PROZAC) 10 MG capsule Take 10 mg by mouth daily.  . hyoscyamine (LEVSIN, ANASPAZ) 0.125 MG tablet Take by mouth.  . losartan (COZAAR) 25 MG tablet Take 25 mg by mouth daily.  . metroNIDAZOLE (FLAGYL) 500 MG tablet Take 1 tablet (500 mg total) by mouth 2 (two) times daily for 14 days.  .  Misc Natural Products (OSTEO BI-FLEX/5-LOXIN ADVANCED) TABS Take by mouth daily.  . Multiple Vitamins-Minerals (MULTIVITAMIN WITH MINERALS) tablet Take 1 tablet by mouth daily. Reported on 09/11/2015  . pyridoxine (B-6) 100 MG tablet Take 100 mg by mouth daily.  Marland Kitchen terazosin (HYTRIN) 10 MG capsule Take 1 capsule (10 mg total) by mouth at bedtime.  . triamcinolone (KENALOG) 0.025 % ointment Apply 1 application topically 2 (two) times daily.  Marland Kitchen amLODipine (NORVASC) 10 MG tablet Take by mouth.  . cilostazol (PLETAL) 50 MG tablet Take by mouth.  . fenofibrate 54 MG tablet Take by mouth.  Marland Kitchen glucosamine-chondroitin 500-400 MG tablet Take 1 tablet by mouth 2 (two) times daily.  . predniSONE (DELTASONE) 10 MG tablet 6 PO Q D X 1 DAY, THEN 5 PO Q D X 1 DAY, THEN 4 PO Q D X 1 DAY, THEN 3 PO Q D X 1 DAY, THEN 2 PO Q D X 1 DAY, THEN 1 PO Q D X 1 DAY  . Pyridoxine HCl (B-6) 100 MG TABS Take by mouth.  . sertraline (ZOLOFT) 25 MG tablet Take 25 mg by mouth 2 (two) times daily.   No facility-administered encounter medications on file as of 08/24/2017.     Surgical History: Past Surgical History:  Procedure Laterality Date  . HAND SURGERY Bilateral   . HERNIA REPAIR     x 2  . NOSE SURGERY    . REPLACEMENT TOTAL KNEE      Medical History: Past Medical History:  Diagnosis Date  .  Anxiety and depression   . Frequency   . Gallstones 03/05/2013  . Hypertension   . Kidney problem   May  2013  . Mixed hyperlipidemia   . OAB (overactive bladder)   . Obstructive sleep apnea (adult) (pediatric)     Family History: Family History  Problem Relation Age of Onset  . COPD Mother   . Heart disease Mother   . Cancer Father        Brain tumor  . Lung cancer Paternal Uncle   . Cancer - Other Maternal Grandmother        Back  . Kidney disease Neg Hx   . Prostate cancer Neg Hx     Social History: Social History   Socioeconomic History  . Marital status: Married    Spouse name: Not on file  .  Number of children: Not on file  . Years of education: Not on file  . Highest education level: Not on file  Social Needs  . Financial resource strain: Not on file  . Food insecurity - worry: Not on file  . Food insecurity - inability: Not on file  . Transportation needs - medical: Not on file  . Transportation needs - non-medical: Not on file  Occupational History  . Not on file  Tobacco Use  . Smoking status: Former Research scientist (life sciences)  . Smokeless tobacco: Current User    Types: Chew  Substance and Sexual Activity  . Alcohol use: No    Alcohol/week: 0.0 oz  . Drug use: No  . Sexual activity: Not on file  Other Topics Concern  . Not on file  Social History Narrative  . Not on file    Vital Signs: Blood pressure (!) 142/78, pulse 73, resp. rate 16, height 5\' 11"  (1.803 m), weight (!) 325 lb (147.4 kg), SpO2 94 %.  Examination: General Appearance: The patient is well-developed, well-nourished, and in no distress. Skin: Gross inspection of skin unremarkable. Head: normocephalic, no gross deformities. Eyes: no gross deformities noted. ENT: ears appear grossly normal no exudates. Neck: Supple. No thyromegaly. No LAD. Respiratory: no rhonchi noted. Cardiovascular: Normal S1 and S2 without murmur or rub. Extremities: No cyanosis. pulses are equal. Neurologic: Alert and oriented. No involuntary movements.  LABS: Recent Results (from the past 2160 hour(s))  Urinalysis, Routine w reflex microscopic     Status: None   Collection Time: 07/28/17  9:25 AM  Result Value Ref Range   Specific Gravity, UA 1.020 1.005 - 1.030   pH, UA 5.5 5.0 - 7.5   Color, UA Yellow Yellow   Appearance Ur Clear Clear   Leukocytes, UA Negative Negative   Protein, UA Negative Negative/Trace   Glucose, UA Negative Negative   Ketones, UA Negative Negative   RBC, UA Negative Negative   Bilirubin, UA Negative Negative   Urobilinogen, Ur 0.2 0.2 - 1.0 mg/dL   Nitrite, UA Negative Negative   Microscopic  Examination Comment     Comment: Microscopic not indicated and not performed.  C-reactive protein     Status: None   Collection Time: 08/14/17  2:50 PM  Result Value Ref Range   CRP <0.8 <1.0 mg/dL    Comment: Performed at [facility], [place] [address] [place], [place] [address]  Calprotectin, Fecal     Status: None   Collection Time: 08/16/17  9:15 AM  Result Value Ref Range   Calprotectin, Fecal 42 0 - 120 ug/g    Comment: (NOTE) Concentration  Interpretation   Follow-Up <16 - 50 ug/g     Normal           None >50 -120 ug/g     Borderline       Re-evaluate in 4-6 weeks    >120 ug/g     Abnormal         Repeat as clinically                                   indicated Performed At: [facility] [place], [place] [id number] Rush Farmer MD [id number] Performed at [facility], [address] [place], [place] [address]   Pancreatic elastase, fecal     Status: None   Collection Time: 08/16/17  9:15 AM  Result Value Ref Range   Pancreatic Elastase-1, Stool >500 >200 ug Elast./g    Comment: (NOTE)       Severe Pancreatic Insufficiency:          <100       Moderate Pancreatic Insufficiency:   100 - 200       Normal:                                   >200 Performed At: [facility] [place], [place] [id number] Rush Farmer MD [id number] Performed at [facility], [address] [place], [place] [address]   C difficile quick scan w PCR reflex     Status: None   Collection Time: 08/16/17  9:15 AM  Result Value Ref Range   C Diff antigen NEGATIVE NEGATIVE   C Diff toxin NEGATIVE NEGATIVE   C Diff interpretation No C. difficile detected.     Comment: Performed at [facility], [address] [place], [place] [address]  Gastrointestinal Panel by PCR , Stool     Status: None   Collection Time: 08/16/17  9:15 AM  Result Value Ref Range   Campylobacter species NOT DETECTED NOT DETECTED    Plesimonas shigelloides NOT DETECTED NOT DETECTED   Salmonella species NOT DETECTED NOT DETECTED   Yersinia enterocolitica NOT DETECTED NOT DETECTED   Vibrio species NOT DETECTED NOT DETECTED   Vibrio cholerae NOT DETECTED NOT DETECTED   Enteroaggregative E coli (EAEC) NOT DETECTED NOT DETECTED   Enteropathogenic E coli (EPEC) NOT DETECTED NOT DETECTED   Enterotoxigenic E coli (ETEC) NOT DETECTED NOT DETECTED   Shiga like toxin producing E coli (STEC) NOT DETECTED NOT DETECTED   Shigella/Enteroinvasive E coli (EIEC) NOT DETECTED NOT DETECTED   Cryptosporidium NOT DETECTED NOT DETECTED   Cyclospora cayetanensis NOT DETECTED NOT DETECTED   Entamoeba histolytica NOT DETECTED NOT DETECTED   Giardia lamblia NOT DETECTED NOT DETECTED   Adenovirus F40/41 NOT DETECTED NOT DETECTED   Astrovirus NOT DETECTED NOT DETECTED   Norovirus GI/GII NOT DETECTED NOT DETECTED   Rotavirus A NOT DETECTED NOT DETECTED   Sapovirus (I, II, IV, and V) NOT DETECTED NOT DETECTED    Comment: Performed at [facility], [address] [place], [place] [address]    Radiology: No results found.  No results found.  No results found.    Assessment and Plan: Patient Active Problem List   Diagnosis Date Noted  . Obstructive sleep apnea (adult) (pediatric) 08/24/2017  . Mixed hyperlipidemia 07/28/2017  . Allergic rhinitis due to  pollen 07/28/2017  . Hypoxemia 07/28/2017  . Calculus of kidney 07/28/2017  . Chest pain, unspecified 07/28/2017  . Major depressive disorder, recurrent, moderate ([place]) 07/28/2017  . 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 Pain in left ankle and joints of left foot 07/28/2017  . Essential (primary) hypertension 07/28/2017  . Elevated hemoglobin A1c 12/02/2015  . Urge incontinence 11/25/2015  . Urinary frequency 10/21/2015  . OAB (overactive bladder) 09/23/2015  . Frequency 09/18/2015  . Gallstones 03/05/2013    1. OSA needs follow up PSG and CPAP done. Will get this  ordered also need to assess his oxygen needs. Patient is still not able to lose. He is also on oxygen and so therefore would be better off in the lab to do the titration if positive 2. Obesity again needs to be working on his weight. He has not been able to make much progress in that respect 3. Allergic rhinitis doing fine at this time  General Counseling: I have discussed the findings of the evaluation and examination with [place].  I have also discussed any further diagnostic evaluation thatmay be needed or ordered today. Naod verbalizes understanding of the findings of todays visit. We also reviewed his medications today and discussed drug interactions and side effects including but not limited excessive drowsiness and altered mental states. We also discussed that there is always a risk not just to him but also people around him. he has been encouraged to call the office with any questions or concerns that should arise related to todays visit.    Time spent: 34min  I have personally obtained a history, examined the patient, evaluated laboratory and imaging results, formulated the assessment and plan and placed orders.    Allyne Gee, MD [facility] Pulmonary and Critical Care Sleep medicine

## 2017-08-24 NOTE — Patient Instructions (Signed)

## 2017-09-01 ENCOUNTER — Telehealth: Payer: Self-pay

## 2017-09-01 NOTE — Telephone Encounter (Signed)
Faxed Dr. Lurena Joiner Burack's most recent colonoscopy to [phone] attn: Kami .Titania

## 2017-09-06 ENCOUNTER — Encounter: Payer: Self-pay | Admitting: Gastroenterology

## 2017-09-11 ENCOUNTER — Encounter: Payer: Self-pay | Admitting: Gastroenterology

## 2017-09-11 ENCOUNTER — Ambulatory Visit: Payer: Medicare HMO | Admitting: Gastroenterology

## 2017-09-11 ENCOUNTER — Other Ambulatory Visit: Payer: Self-pay

## 2017-09-11 VITALS — BP 124/78 | HR 59 | Temp 97.6°F | Ht 71.0 in | Wt 321.3 lb

## 2017-09-11 DIAGNOSIS — R152 Fecal urgency: Secondary | ICD-10-CM | POA: Diagnosis not present

## 2017-09-11 NOTE — Progress Notes (Signed)
Cephas Darby, MD [address]  [place]  [place], [place] [address]  Main: [phone]  Fax: [phone]    Gastroenterology Consultation  Referring Provider:     Lavera Guise, MD Primary Care Physician:  Lavera Guise, MD Primary Gastroenterologist:  Dr. Cephas Darby Reason for Consultation:   Fecal urgency        HPI:   Jimmy Norton. is a 72 y.o. male referred by Dr. Lavera Guise, MD  for consultation & management of fecal urgency. Patient reports approximately 1 year history of fecal urgency whenever he urinates. He also reports leakage associated with lower abdominal cramps, passing flatus. He denies nocturnal diarrhea or incontinence. He reports variable consistency in his bowel movements, anywhere from soft to hard. He is morbidly obese, consumes soda and red meat on a regular basis. He denies losing weight, blood in stools or black stools. He reports having had a colonoscopy within last 5 years, unable to recall the results. He is prescribed hyoscyamine 0.125 and takes it as needed. He has history of sleep apnea, on CPAP. He chews tobacco daily. He had CT renal study protocol in 02/2017 which showed extensive sigmoid diverticulosis.  Follow up visit 09/11/2017: He had stool studies which were negative for infection, pancreatic elastase is normal, fecal calprotectin is normal. CRP is normal. I empirically treated him for possible diverticulitis with 2 weeks course of Cipro and Flagyl. Patient finished the course on 09/07/2017. He reports feeling significantly better. His stools are soft and not having fecal urgency as frequently.  NSAIDs: None  Antiplts/Anticoagulants/Anti thrombotics: None  GI Procedures: Colonoscopy in 01/07/2009 by Dr. Allen Norris Prep was good. No polyps were detected. Sigmoid diverticulosis and internal hemorrhoids  Past Medical History:  Diagnosis Date  . Anxiety and depression   . Frequency   . Gallstones 03/05/2013  . Hypertension   .  Kidney problem   May  2013  . Mixed hyperlipidemia   . OAB (overactive bladder)   . Obstructive sleep apnea (adult) (pediatric)     Past Surgical History:  Procedure Laterality Date  . HAND SURGERY Bilateral   . HERNIA REPAIR     x 2  . NOSE SURGERY    . REPLACEMENT TOTAL KNEE       Current Outpatient Medications:  .  cholecalciferol (VITAMIN D) 400 UNITS TABS tablet, Take 400 Units by mouth daily., Disp: , Rfl:  .  cilostazol (PLETAL) 50 MG tablet, Take by mouth., Disp: , Rfl:  .  Cyanocobalamin (B-12) 2500 MCG TABS, Take 1 tablet by mouth daily., Disp: , Rfl:  .  glucosamine-chondroitin 500-400 MG tablet, Take 1 tablet by mouth 2 (two) times daily., Disp: , Rfl:  .  losartan (COZAAR) 25 MG tablet, Take 25 mg by mouth daily., Disp: , Rfl:  .  Misc Natural Products (OSTEO BI-FLEX/5-LOXIN ADVANCED) TABS, Take by mouth daily., Disp: , Rfl:  .  Multiple Vitamins-Minerals (MULTIVITAMIN WITH MINERALS) tablet, Take 1 tablet by mouth daily. Reported on 09/11/2015, Disp: , Rfl:  .  pyridoxine (B-6) 100 MG tablet, Take 100 mg by mouth daily., Disp: , Rfl:  .  Pyridoxine HCl (B-6) 100 MG TABS, Take by mouth., Disp: , Rfl:  .  terazosin (HYTRIN) 10 MG capsule, Take 1 capsule (10 mg total) by mouth at bedtime., Disp: 30 capsule, Rfl: 5 .  triamcinolone (KENALOG) 0.025 % ointment, Apply 1 application topically 2 (two) times daily., Disp: 80 g, Rfl: 3 .  amLODipine (NORVASC) 10 MG tablet, Take by mouth., Disp: , Rfl:  .  fenofibrate 54 MG tablet, Take by mouth., Disp: , Rfl:  .  FLUoxetine (PROZAC) 10 MG capsule, Take 10 mg by mouth daily., Disp: , Rfl:  .  FLUoxetine (PROZAC) 10 MG tablet, , Disp: , Rfl:  .  hyoscyamine (LEVSIN, ANASPAZ) 0.125 MG tablet, Take by mouth., Disp: , Rfl:  .  predniSONE (DELTASONE) 10 MG tablet, 6 PO Q D X 1 DAY, THEN 5 PO Q D X 1 DAY, THEN 4 PO Q D X 1 DAY, THEN 3 PO Q D X 1 DAY, THEN 2 PO Q D X 1 DAY, THEN 1 PO Q D X 1 DAY, Disp: , Rfl:  .  sertraline (ZOLOFT) 25 MG  tablet, Take 25 mg by mouth 2 (two) times daily., Disp: , Rfl:    Family History  Problem Relation Age of Onset  . COPD Mother   . Heart disease Mother   . Cancer Father        Brain tumor  . Lung cancer Paternal Uncle   . Cancer - Other Maternal Grandmother        Back  . Kidney disease Neg Hx   . Prostate cancer Neg Hx      Social History   Tobacco Use  . Smoking status: Former Research scientist (life sciences)  . Smokeless tobacco: Current User    Types: Chew  Substance Use Topics  . Alcohol use: No    Alcohol/week: 0.0 oz  . Drug use: No    Allergies as of 09/11/2017 - Review Complete 09/11/2017  Allergen Reaction Noted  . Bee venom Anaphylaxis 04/24/2015  . Penicillins Rash 03/05/2013    Review of Systems:    All systems reviewed and negative except where noted in HPI.   Physical Exam:  BP 124/78   Pulse (!) 59   Temp 97.6 F (36.4 C) (Oral)   Ht 5\' 11"  (1.803 m)   Wt (!) 321 lb 4.8 oz (145.7 kg)   BMI 44.81 kg/m  No LMP for male patient.  General:   Alert,  Well-developed, well-nourished, pleasant and cooperative in NAD Head:  Normocephalic and atraumatic. Eyes:  Sclera clear, no icterus.   Conjunctiva pink. Ears:  Normal auditory acuity. Nose:  No deformity, discharge, or lesions. Mouth:  No deformity or lesions,oropharynx pink & moist. Neck:  Supple; no masses or thyromegaly. Lungs:  Respirations even and unlabored.  Clear throughout to auscultation.   No wheezes, crackles, or rhonchi. No acute distress. Heart:  Regular rate and rhythm; no murmurs, clicks, rubs, or gallops. Abdomen:  Normal bowel sounds. Soft, obese,nontender and non-distended without masses, hepatosplenomegaly or hernias noted.  No guarding or rebound tenderness.   Rectal: Not performed Msk:  Symmetrical without gross deformities. Good, equal movement & strength bilaterally. Pulses:  Normal pulses noted. Extremities:  No clubbing or edema.  No cyanosis. Neurologic:  Alert and oriented x3;  grossly normal  neurologically. Skin:  Intact without significant lesions or rashes. No jaundice. Lymph Nodes:  No significant cervical adenopathy. Psych:  Alert and cooperative. Normal mood and affect.  Imaging Studies: Reviewed  Assessment and Plan:   Jakobe A Starsky Nanna. is a 72 y.o. male with morbid obesity, chronic tobacco chewer, overactive bladder presents with chronic symptoms of fecal urgency associated with lower abdominal cramps and altered bowel habits, imaging evidence of severe sigmoid diverticulosis.he is here for follow-up today. Workup negative for inflammatory bowel disease of colon, exocrine pancreatic  insufficiency and infections. I treated him empirically for possible sigmoid diverticulitis with Cipro and Flagyl. His symptoms have significantly improved.   - consider to repeat another course of Cipro and Flagyl if he has recurrence of symptoms - Continue hyoscyamine as needed - due for colonoscopy in 12/2018   Follow up as needed   Cephas Darby, MD

## 2017-09-21 ENCOUNTER — Ambulatory Visit: Payer: Self-pay | Admitting: Internal Medicine

## 2017-10-04 ENCOUNTER — Other Ambulatory Visit: Payer: Self-pay

## 2017-10-04 MED ORDER — FLUOXETINE HCL 10 MG PO CAPS: 10.0000 mg | ORAL_CAPSULE | Freq: Every day | ORAL | 3 refills | Status: DC

## 2017-10-16 ENCOUNTER — Ambulatory Visit: Payer: Medicare HMO | Admitting: Internal Medicine

## 2017-10-16 ENCOUNTER — Encounter: Payer: Self-pay | Admitting: Internal Medicine

## 2017-10-16 VITALS — BP 142/70 | HR 105 | Ht 71.0 in | Wt 322.0 lb

## 2017-10-16 DIAGNOSIS — J209 Acute bronchitis, unspecified: Secondary | ICD-10-CM

## 2017-10-16 DIAGNOSIS — J301 Allergic rhinitis due to pollen: Secondary | ICD-10-CM

## 2017-10-16 DIAGNOSIS — J440 Chronic obstructive pulmonary disease with acute lower respiratory infection: Secondary | ICD-10-CM

## 2017-10-16 DIAGNOSIS — G4733 Obstructive sleep apnea (adult) (pediatric): Secondary | ICD-10-CM

## 2017-10-16 DIAGNOSIS — Z9989 Dependence on other enabling machines and devices: Secondary | ICD-10-CM | POA: Diagnosis not present

## 2017-10-16 MED ORDER — AZITHROMYCIN 250 MG PO TABS: ORAL_TABLET | ORAL | 0 refills | Status: DC

## 2017-10-16 NOTE — Patient Instructions (Signed)
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
## 2017-11-20 ENCOUNTER — Encounter: Payer: Self-pay | Admitting: Internal Medicine

## 2017-11-20 ENCOUNTER — Ambulatory Visit: Payer: Medicare HMO | Admitting: Internal Medicine

## 2017-11-20 VITALS — BP 136/76 | HR 69 | Resp 16 | Ht 71.0 in | Wt 323.8 lb

## 2017-11-20 DIAGNOSIS — G40309 Generalized idiopathic epilepsy and epileptic syndromes, not intractable, without status epilepticus: Secondary | ICD-10-CM

## 2017-11-20 DIAGNOSIS — J301 Allergic rhinitis due to pollen: Secondary | ICD-10-CM

## 2017-11-20 DIAGNOSIS — Z9989 Dependence on other enabling machines and devices: Secondary | ICD-10-CM

## 2017-11-20 DIAGNOSIS — G4733 Obstructive sleep apnea (adult) (pediatric): Secondary | ICD-10-CM

## 2017-11-20 NOTE — Patient Instructions (Signed)
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
## 2017-11-22 ENCOUNTER — Other Ambulatory Visit (INDEPENDENT_AMBULATORY_CARE_PROVIDER_SITE_OTHER): Payer: Medicare HMO | Admitting: Internal Medicine

## 2017-11-22 DIAGNOSIS — G4733 Obstructive sleep apnea (adult) (pediatric): Secondary | ICD-10-CM

## 2017-11-27 ENCOUNTER — Ambulatory Visit (INDEPENDENT_AMBULATORY_CARE_PROVIDER_SITE_OTHER): Payer: Medicare HMO | Admitting: Nurse Practitioner

## 2017-11-27 ENCOUNTER — Encounter: Payer: Self-pay | Admitting: Nurse Practitioner

## 2017-11-27 VITALS — BP 132/78 | HR 75 | Temp 97.8°F | Resp 16 | Ht 71.0 in | Wt 319.6 lb

## 2017-11-27 DIAGNOSIS — I10 Essential (primary) hypertension: Secondary | ICD-10-CM | POA: Diagnosis not present

## 2017-11-27 DIAGNOSIS — M722 Plantar fascial fibromatosis: Secondary | ICD-10-CM | POA: Diagnosis not present

## 2017-11-27 DIAGNOSIS — G4733 Obstructive sleep apnea (adult) (pediatric): Secondary | ICD-10-CM

## 2017-11-27 DIAGNOSIS — Z9989 Dependence on other enabling machines and devices: Secondary | ICD-10-CM | POA: Diagnosis not present

## 2017-11-27 DIAGNOSIS — N3281 Overactive bladder: Secondary | ICD-10-CM | POA: Diagnosis not present

## 2017-11-27 DIAGNOSIS — M19021 Primary osteoarthritis, right elbow: Secondary | ICD-10-CM

## 2017-11-27 DIAGNOSIS — W57XXXA Bitten or stung by nonvenomous insect and other nonvenomous arthropods, initial encounter: Secondary | ICD-10-CM

## 2017-11-27 MED ORDER — DOXYCYCLINE HYCLATE 100 MG PO TABS: 100.0000 mg | ORAL_TABLET | Freq: Two times a day (BID) | ORAL | 0 refills | Status: DC

## 2017-11-27 NOTE — Progress Notes (Signed)
[facility] [place], [place] [address]  Internal MEDICINE  Office Visit Note  Patient Name: Jimmy Norton  [id number]  [id number]  Date of Service: 11/27/2017   Pt is here for routine follow up.  Chief Complaint  Patient presents with  . Insect Bite    tick bite on the right forearm    The patient was bit by a tick right forearm last Saturday. Is itchy and red. He knows it was a deer tick. Had white spot on it's  back.  Left ankle pain. Has seen podiatry off and on. Got injections into the ankle which helped a lot. Last injection was about a year ago. Was doing well, but recently started to have more severe pain.  Has right shoulder pain. Pain also in right elbow and hand. Hand will actually go numb. Linward Headland is described a burning or tingling. Happens nearly every time he lays his arm down to rest.  He continues to have a lot of issues with his bladder. Severe frequency and nocturia.. Has already seen [facility]rology and [facility]. Had procedure to help with severe overactive bladder without relief. Getting worse and is very disruptive, during the day and the night. He states that he is supposed to go on cruise to [place] in June and he does not want to go because issues with his bladder are so disruptive.        Current Medication: Outpatient Encounter Medications as of 11/27/2017  Medication Sig  . cholecalciferol (VITAMIN D) 400 UNITS TABS tablet Take 400 Units by mouth daily.  Marland Kitchen FLUoxetine (PROZAC) 10 MG capsule Take 1 capsule (10 mg total) by mouth daily.  Marland Kitchen glucosamine-chondroitin 500-400 MG tablet Take 1 tablet by mouth 2 (two) times daily.  Marland Kitchen losartan (COZAAR) 25 MG tablet Take 25 mg by mouth daily.  . Misc Natural Products (OSTEO BI-FLEX/5-LOXIN ADVANCED) TABS Take by mouth daily.  . Multiple Vitamins-Minerals (MULTIVITAMIN WITH MINERALS) tablet Take 1 tablet by mouth daily. Reported on 09/11/2015  . terazosin (HYTRIN) 10 MG capsule Take 1 capsule  (10 mg total) by mouth at bedtime.  Marland Kitchen amLODipine (NORVASC) 10 MG tablet Take by mouth.  . cilostazol (PLETAL) 50 MG tablet Take by mouth.  . Cyanocobalamin (B-12) 2500 MCG TABS Take 1 tablet by mouth daily.  Marland Kitchen doxycycline (VIBRA-TABS) 100 MG tablet Take 1 tablet (100 mg total) by mouth 2 (two) times daily.  . fenofibrate 54 MG tablet Take by mouth.  . hyoscyamine (LEVSIN, ANASPAZ) 0.125 MG tablet Take by mouth.  . pyridoxine (B-6) 100 MG tablet Take 100 mg by mouth daily.  . Pyridoxine HCl (B-6) 100 MG TABS Take by mouth.  . sertraline (ZOLOFT) 25 MG tablet Take 25 mg by mouth 2 (two) times daily.  Marland Kitchen triamcinolone (KENALOG) 0.025 % ointment Apply 1 application topically 2 (two) times daily. (Patient not taking: Reported on 11/20/2017)   No facility-administered encounter medications on file as of 11/27/2017.     Surgical History: Past Surgical History:  Procedure Laterality Date  . HAND SURGERY Bilateral   . HERNIA REPAIR     x 2  . NOSE SURGERY    . REPLACEMENT TOTAL KNEE      Medical History: Past Medical History:  Diagnosis Date  . Anxiety and depression   . Frequency   . Gallstones 03/05/2013  . Hypertension   . Kidney problem   May  2013  . Mixed hyperlipidemia   . OAB (overactive bladder)   .  Obstructive sleep apnea (adult) (pediatric)     Family History: Family History  Problem Relation Age of Onset  . COPD Mother   . Heart disease Mother   . Cancer Father        Brain tumor  . Lung cancer Paternal Uncle   . Cancer - Other Maternal Grandmother        Back  . Kidney disease Neg Hx   . Prostate cancer Neg Hx     Social History   Socioeconomic History  . Marital status: Married    Spouse name: Not on file  . Number of children: Not on file  . Years of education: Not on file  . Highest education level: Not on file  Occupational History  . Not on file  Social Needs  . Financial resource strain: Not on file  . Food insecurity:    Worry: Not on file     Inability: Not on file  . Transportation needs:    Medical: Not on file    Non-medical: Not on file  Tobacco Use  . Smoking status: Current Some Day Smoker    Types: Cigarettes  . Smokeless tobacco: Current User    Types: Chew  Substance and Sexual Activity  . Alcohol use: No    Alcohol/week: 0.0 oz  . Drug use: No  . Sexual activity: Not on file  Lifestyle  . Physical activity:    Days per week: Not on file    Minutes per session: Not on file  . Stress: Not on file  Relationships  . Social connections:    Talks on phone: Not on file    Gets together: Not on file    Attends religious service: Not on file    Active member of club or organization: Not on file    Attends meetings of clubs or organizations: Not on file    Relationship status: Not on file  . Intimate partner violence:    Fear of current or ex partner: Not on file    Emotionally abused: Not on file    Physically abused: Not on file    Forced sexual activity: Not on file  Other Topics Concern  . Not on file  Social History Narrative  . Not on file      Review of Systems  Constitutional: Positive for fatigue.       Has had weight loss of 2 pounds since most recent visit. States that he has had to tighten his belt three holes to keep up his pants.   HENT: Negative for rhinorrhea, sore throat and voice change.   Eyes: Negative.   Respiratory: Negative for chest tightness, shortness of breath and wheezing.   Cardiovascular: Negative for chest pain and palpitations.  Gastrointestinal: Positive for constipation and diarrhea.       Rectal pressure especially when he has to urinate.   Endocrine: Negative for cold intolerance, heat intolerance, polydipsia, polyphagia and polyuria.  Genitourinary: Positive for frequency and urgency.       Nocturia. Bladder issues are becoming very disruptive.   Musculoskeletal: Positive for arthralgias and back pain.  Skin:       Insect bit to right foream. Getting red and itchy.    Neurological: Positive for headaches. Negative for dizziness.       History of migraines. Has had a few off and on, but nothing out of the ordinary.  Hematological: Negative.   Psychiatric/Behavioral: Negative for suicidal ideas.  Depression   Today's Vitals   11/27/17 0858  BP: 132/78  Pulse: 75  Resp: 16  Temp: 97.8 F (36.6 C)  TempSrc: Oral  SpO2: 95%  Weight: (!) 319 lb 9.6 oz (145 kg)  Height: 5\' 11"  (1.803 m)    Physical Exam  Constitutional: He is oriented to person, place, and time. He appears well-developed and well-nourished. No distress.  HENT:  Head: Normocephalic and atraumatic.  Mouth/Throat: Oropharynx is clear and moist. No oropharyngeal exudate.  Eyes: Pupils are equal, round, and reactive to light. EOM are normal.  Neck: Normal range of motion. Neck supple. No JVD present. Carotid bruit is not present. No tracheal deviation present. No thyromegaly present.  Cardiovascular: Normal rate, regular rhythm and normal heart sounds. Exam reveals no gallop and no friction rub.  No murmur heard. Pulmonary/Chest: Effort normal and breath sounds normal. No respiratory distress. He has no wheezes. He has no rales. He exhibits no tenderness.  Abdominal: Soft. Bowel sounds are normal.  Musculoskeletal: Normal range of motion.  Right shoulder tenderness with reduced ROM and strength. Right elbow mildly swollen with reduced ROM and strength. No visible abnormalities or deformities present.  Left ankle and left foot pain with mild to moderate swelling. Pain is worse when walking. He has reduced ROM and strength in left foot.   Lymphadenopathy:    He has no cervical adenopathy.  Neurological: He is alert and oriented to person, place, and time. No cranial nerve deficit.  Skin: Skin is warm and dry. He is not diaphoretic.  Psychiatric: He has a normal mood and affect. His behavior is normal. Judgment and thought content normal.  Nursing note and vitals  reviewed.   Assessment/Plan: 1. Tick bite, initial encounter Right anterior forearm. Start doxycycline 100mg  twice daily for 10 days. Use topical hydrocortisone cream as needed for itching and irritation.  - doxycycline (VIBRA-TABS) 100 MG tablet; Take 1 tablet (100 mg total) by mouth 2 (two) times daily.  Dispense: 20 tablet; Refill: 0  2. Plantar fasciitis Refer to [place]. Patient has had cortisone injections for this in the past and would like to have treatment again.  - Ambulatory referral to Podiatry  3. Primary osteoarthritis of right elbow Prior x-ray did show joint effusion with moderate osteoarthritic changes. Pain getting worse. Will refer to orthopedics for further evaluation and treatment.  - Ambulatory referral to Orthopedic Surgery  4. OAB (overactive bladder) Has seen local urologists and has had procedures in the past which have not helped at all. Will refer to [facility] urology for further evaluation and treatment.  - Ambulatory referral to Urology  5. Essential (primary) hypertension Continue bp medication as prescribed.   6. OSA on CPAP Continue regular visits with Dr. Devona Konig for CPAP management.   General Counseling: Jimmy Norton verbalizes understanding of the findings of todays visit and agrees with plan of treatment. I have discussed any further diagnostic evaluation that may be needed or ordered today. We also reviewed his medications today. he has been encouraged to call the office with any questions or concerns that should arise related to todays visit.   This patient was seen by Leretha Pol, FNP- C in Collaboration with Dr Lavera Guise as a part of collaborative care agreement    Orders Placed This Encounter  Procedures  . Ambulatory referral to Orthopedic Surgery  . Ambulatory referral to Podiatry  . Ambulatory referral to Urology    Meds ordered this encounter  Medications  . doxycycline (  VIBRA-TABS) 100 MG tablet    Sig: Take 1 tablet (100  mg total) by mouth 2 (two) times daily.    Dispense:  20 tablet    Refill:  0    Order Specific Question:   Supervising Provider    Answer:   Lavera Guise [[id number]]    Time spent: 32 Minutes      Dr Lavera Guise Internal medicine

## 2017-11-30 DIAGNOSIS — Z6841 Body Mass Index (BMI) 40.0 and over, adult: Secondary | ICD-10-CM | POA: Insufficient documentation

## 2017-11-30 DIAGNOSIS — M5412 Radiculopathy, cervical region: Secondary | ICD-10-CM | POA: Diagnosis not present

## 2017-12-01 ENCOUNTER — Ambulatory Visit (INDEPENDENT_AMBULATORY_CARE_PROVIDER_SITE_OTHER): Payer: Medicare HMO

## 2017-12-01 ENCOUNTER — Ambulatory Visit (INDEPENDENT_AMBULATORY_CARE_PROVIDER_SITE_OTHER): Payer: Medicare HMO | Admitting: Podiatry

## 2017-12-01 ENCOUNTER — Encounter: Payer: Self-pay | Admitting: Podiatry

## 2017-12-01 DIAGNOSIS — M659 Synovitis and tenosynovitis, unspecified: Secondary | ICD-10-CM

## 2017-12-01 DIAGNOSIS — M722 Plantar fascial fibromatosis: Secondary | ICD-10-CM | POA: Diagnosis not present

## 2017-12-01 MED ORDER — MELOXICAM 15 MG PO TABS: 15.0000 mg | ORAL_TABLET | Freq: Every day | ORAL | 1 refills | Status: AC

## 2017-12-04 NOTE — Progress Notes (Signed)
   Subjective: 72 year old male presenting today with a chief complaint of pain to the medial and lateral aspects of bilateral ankles that began about one year ago. He states the pain has been worsening lately. He reports associated pain to the posterior aspects of the heels bilaterally. He has not done anything to treat the symptoms. Walking and standing for long periods of time increases the pain. Patient is here for further evaluation and treatment.   Past Medical History:  Diagnosis Date  . Anxiety and depression   . Frequency   . Gallstones 03/05/2013  . Hypertension   . Kidney problem   May  2013  . Mixed hyperlipidemia   . OAB (overactive bladder)   . Obstructive sleep apnea (adult) (pediatric)        Objective / Physical Exam:  General:  The patient is alert and oriented x3 in no acute distress. Dermatology:  Skin is warm, dry and supple bilateral lower extremities. Negative for open lesions or macerations. Vascular:  Palpable pedal pulses bilaterally. No edema or erythema noted. Capillary refill within normal limits. Neurological:  Epicritic and protective threshold grossly intact bilaterally.  Musculoskeletal Exam:  Pain on palpation to the anterior lateral medial aspects of the patient's bilateral ankles. Mild edema noted. Range of motion within normal limits to all pedal and ankle joints bilateral. Muscle strength 5/5 in all groups bilateral.   Radiographic Exam:  Normal osseous mineralization. Joint spaces preserved. No fracture/dislocation/boney destruction.    Assessment: 1. pain in bilateral ankles  2. Synovitis/DJD bilateral ankles   Plan of Care:  1. Patient was evaluated. X-Rays reviewed.  2. injection of 0.5 mL Celestone Soluspan injected in the patient's bilateral ankle joints. 3. Prescription for Meloxicam provided to patient.  4. Return to clinic as needed.   Going on a trip to [place].    Edrick Kins, DPM [facility]  Dr. Edrick Kins, [place]                                        [place], [place] [address]                Office [phone]  Fax [phone]

## 2017-12-06 DIAGNOSIS — G629 Polyneuropathy, unspecified: Secondary | ICD-10-CM | POA: Diagnosis not present

## 2017-12-06 DIAGNOSIS — N401 Enlarged prostate with lower urinary tract symptoms: Secondary | ICD-10-CM | POA: Diagnosis not present

## 2017-12-06 DIAGNOSIS — R3912 Poor urinary stream: Secondary | ICD-10-CM | POA: Diagnosis not present

## 2017-12-06 DIAGNOSIS — N3281 Overactive bladder: Secondary | ICD-10-CM | POA: Diagnosis not present

## 2017-12-06 DIAGNOSIS — I10 Essential (primary) hypertension: Secondary | ICD-10-CM | POA: Diagnosis not present

## 2017-12-06 DIAGNOSIS — R69 Illness, unspecified: Secondary | ICD-10-CM | POA: Diagnosis not present

## 2017-12-06 DIAGNOSIS — R339 Retention of urine, unspecified: Secondary | ICD-10-CM | POA: Diagnosis not present

## 2017-12-06 DIAGNOSIS — R351 Nocturia: Secondary | ICD-10-CM | POA: Diagnosis not present

## 2017-12-06 DIAGNOSIS — N4883 Acquired buried penis: Secondary | ICD-10-CM | POA: Diagnosis not present

## 2017-12-06 DIAGNOSIS — R350 Frequency of micturition: Secondary | ICD-10-CM | POA: Diagnosis not present

## 2018-01-01 DIAGNOSIS — N3281 Overactive bladder: Secondary | ICD-10-CM | POA: Diagnosis not present

## 2018-01-23 DIAGNOSIS — N3281 Overactive bladder: Secondary | ICD-10-CM | POA: Diagnosis not present

## 2018-02-11 NOTE — Procedures (Signed)
[facility] [place], [place] [address]  Sleep Specialist: Allyne Gee, MD [facility]  Home Sleep Study Interpretation  Patient Name: Jimmy Norton. Patient MR [id number] DOB:Aug 08, 1945  Date of Study:   Nov 22, 2017  Indications for study:  Hypersomnia sleep apnea  BMI:  45 kilogram/meters squared       Respiratory Data:  Total AHI:  26.2 per hour  Total Obstructive Apneas:  2  Total Central Apneas:  0  Total Mixed Apneas:  0  Total Hypopneas:  0  If the AHI is greater than 5 per hour patient qualifies for PAP evaluation  Oximetry Data:  Oxygen Desaturation Index: 3.4 per hour  Lowest Desaturation:  87%  Low is saturation 84%  Cardiac Data:  Minimum Heart Rate:  53  Maximum Heart Rate:  107   Impression / Diagnosis:   this study is inconclusive as the patient did not have enough evaluation time on the study.  A would be recommended to reassess and perhaps do a steady in the lab to assess for presence of obstructive sleep apnea hypopnea syndrome.  There is significant oxygen desaturation noted.  There were no significant arrhythmias noted.  Clinical correlation is strongly recommended  GENERAL Recommendations:  1.  Consider Auto PAP with pressure ranges 5-20 cmH20 with download, or facility based PAP Titration Study  2.  Consider PAP interface mask fitted for patient comfort, Heated Humidification & PAP compliance monitoring (1 month, 3 months & 12 months after PAP initiation)  3. Consider treatment with mandibular advancement splint (MAS) or referral to an ENT surgeon for modification to the upper airway if the patient prefers an alternate therapy or the PAP trial is unsuccessful  4. Sleep hygiene measures should be discussed with the patient  5. Behavioral therapy such as weight reduction or smoking cessation as appropriate for the patient  6. Advise patient against the use of alcohol or sedatives in so much as these  substances can worsen excessive daytime sleepiness and respiratory disturbances of sleep  7. Advise patient against participating in potentially dangerous activities while drowsy such as operating a motor vehicle, heavy equipment or power tools as it can put them and others in danger  8. Advise patient of the long term consequences of OSA if left untreated, need for treatment and close follow up  9. Clinical follow up as deemed necessary     This Level III home sleep study was performed using the [employer], a 4 channel screening device subject to limitations. Depending on actual total sleep time, not measured in this study, the AHI (sum of apneas and hypopneas/hr of sleep) and therefore the severity of sleep apnea may be underestimated. As with any single night study, including Level 1 attended PSG, severity of sleep apnea may also be underestimated due to the lack of supine and/or REM sleep.  The interpretation associated with this report is based on normal values and degrees of severity in accordance with AASM parameters and/or estimated from multiple sources in the literature for adults ages 65-80+. These may not agree with the displayed values. The patient's treating physician should use the interpretation and recommendations in conjunction with the overall clinical evaluation and treatment of the patient.  Some of the terminology used in this scored ApneaLink report was developed several years ago and may not always be in accordance with current nomenclature. This in no way affects the accuracy of the data or the reliability of the interpretation  and recommendations.

## 2018-03-08 ENCOUNTER — Ambulatory Visit: Payer: Medicare HMO | Admitting: Adult Health

## 2018-03-08 ENCOUNTER — Encounter: Payer: Self-pay | Admitting: Adult Health

## 2018-03-08 VITALS — BP 130/80 | HR 75 | Resp 16 | Ht 71.0 in | Wt 325.0 lb

## 2018-03-08 DIAGNOSIS — E1165 Type 2 diabetes mellitus with hyperglycemia: Secondary | ICD-10-CM

## 2018-03-08 DIAGNOSIS — Z9989 Dependence on other enabling machines and devices: Secondary | ICD-10-CM

## 2018-03-08 DIAGNOSIS — I10 Essential (primary) hypertension: Secondary | ICD-10-CM | POA: Diagnosis not present

## 2018-03-08 DIAGNOSIS — Z23 Encounter for immunization: Secondary | ICD-10-CM | POA: Diagnosis not present

## 2018-03-08 DIAGNOSIS — G4733 Obstructive sleep apnea (adult) (pediatric): Secondary | ICD-10-CM

## 2018-03-08 NOTE — Patient Instructions (Signed)

## 2018-03-08 NOTE — Progress Notes (Signed)
[facility] [place], [place] [address]  Internal MEDICINE  Office Visit Note  Patient Name: Jimmy Norton  [id number]  [id number]  Date of Service: 03/08/2018  Chief Complaint  Patient presents with  . Hypertension  . Hyperlipidemia  . Quality Metric Gaps    Pneumnovax    HPI  Pt here for follow up for Htn and hyperlipidemia. He is currently taking his medication for HTN but says he is not taking anything for his HLD.  He has been seeing urology recently for overactive bladder and buried penis, and will continue to be followed by them.     Current Medication: Outpatient Encounter Medications as of 03/08/2018  Medication Sig  . cholecalciferol (VITAMIN D) 400 UNITS TABS tablet Take 400 Units by mouth daily.  . Cyanocobalamin (B-12) 2500 MCG TABS Take 1 tablet by mouth daily.  Marland Kitchen FLUoxetine (PROZAC) 10 MG capsule Take 1 capsule (10 mg total) by mouth daily.  Marland Kitchen glucosamine-chondroitin 500-400 MG tablet Take 1 tablet by mouth 2 (two) times daily.  Marland Kitchen losartan (COZAAR) 25 MG tablet Take 25 mg by mouth daily.  . Misc Natural Products (OSTEO BI-FLEX/5-LOXIN ADVANCED) TABS Take by mouth daily.  . Multiple Vitamins-Minerals (MULTIVITAMIN WITH MINERALS) tablet Take 1 tablet by mouth daily. Reported on 09/11/2015  . pyridoxine (B-6) 100 MG tablet Take 100 mg by mouth daily.  . Pyridoxine HCl (B-6) 100 MG TABS Take by mouth.  . terazosin (HYTRIN) 10 MG capsule Take 1 capsule (10 mg total) by mouth at bedtime.  . triamcinolone (KENALOG) 0.025 % ointment Apply 1 application topically 2 (two) times daily.  Marland Kitchen tiZANidine (ZANAFLEX) 4 MG tablet Take by mouth.  . [DISCONTINUED] doxycycline (VIBRA-TABS) 100 MG tablet Take 1 tablet (100 mg total) by mouth 2 (two) times daily. (Patient not taking: Reported on 03/08/2018)   No facility-administered encounter medications on file as of 03/08/2018.     Surgical History: Past Surgical History:  Procedure Laterality Date  .  HAND SURGERY Bilateral   . HERNIA REPAIR     x 2  . NOSE SURGERY    . REPLACEMENT TOTAL KNEE      Medical History: Past Medical History:  Diagnosis Date  . Anxiety and depression   . Frequency   . Gallstones 03/05/2013  . Hypertension   . Kidney problem   May  2013  . Mixed hyperlipidemia   . OAB (overactive bladder)   . Obstructive sleep apnea (adult) (pediatric)     Family History: Family History  Problem Relation Age of Onset  . COPD Mother   . Heart disease Mother   . Cancer Father        Brain tumor  . Lung cancer Paternal Uncle   . Cancer - Other Maternal Grandmother        Back  . Kidney disease Neg Hx   . Prostate cancer Neg Hx     Social History   Socioeconomic History  . Marital status: Married    Spouse name: Not on file  . Number of children: Not on file  . Years of education: Not on file  . Highest education level: Not on file  Occupational History  . Not on file  Social Needs  . Financial resource strain: Not on file  . Food insecurity:    Worry: Not on file    Inability: Not on file  . Transportation needs:    Medical: Not on file    Non-medical: Not  on file  Tobacco Use  . Smoking status: Current Some Day Smoker    Types: Cigarettes  . Smokeless tobacco: Current User    Types: Chew  Substance and Sexual Activity  . Alcohol use: No    Alcohol/week: 0.0 standard drinks  . Drug use: No  . Sexual activity: Not on file  Lifestyle  . Physical activity:    Days per week: Not on file    Minutes per session: Not on file  . Stress: Not on file  Relationships  . Social connections:    Talks on phone: Not on file    Gets together: Not on file    Attends religious service: Not on file    Active member of club or organization: Not on file    Attends meetings of clubs or organizations: Not on file    Relationship status: Not on file  . Intimate partner violence:    Fear of current or ex partner: Not on file    Emotionally abused: Not on  file    Physically abused: Not on file    Forced sexual activity: Not on file  Other Topics Concern  . Not on file  Social History Narrative  . Not on file      Review of Systems  Constitutional: Negative.  Negative for chills, fatigue and unexpected weight change.  HENT: Negative.  Negative for congestion, rhinorrhea, sneezing and sore throat.   Eyes: Negative for redness.  Respiratory: Negative.  Negative for cough, chest tightness and shortness of breath.   Cardiovascular: Negative.  Negative for chest pain and palpitations.  Gastrointestinal: Negative.  Negative for abdominal pain, constipation, diarrhea, nausea and vomiting.  Endocrine: Negative.   Genitourinary: Negative.  Negative for dysuria and frequency.  Musculoskeletal: Negative.  Negative for arthralgias, back pain, joint swelling and neck pain.  Skin: Negative.  Negative for rash.  Allergic/Immunologic: Negative.   Neurological: Negative.  Negative for tremors and numbness.  Hematological: Negative for adenopathy. Does not bruise/bleed easily.  Psychiatric/Behavioral: Negative.  Negative for behavioral problems, sleep disturbance and suicidal ideas. The patient is not nervous/anxious.     Vital Signs: BP 130/80   Pulse 75   Resp 16   Ht 5\' 11"  (1.803 m)   Wt (!) 325 lb (147.4 kg)   SpO2 96%   BMI 45.33 kg/m    Physical Exam  Constitutional: He is oriented to person, place, and time. He appears well-developed and well-nourished. No distress.  HENT:  Head: Normocephalic and atraumatic.  Mouth/Throat: Oropharynx is clear and moist. No oropharyngeal exudate.  Eyes: Pupils are equal, round, and reactive to light. EOM are normal.  Neck: Normal range of motion. Neck supple. No JVD present. No tracheal deviation present. No thyromegaly present.  Cardiovascular: Normal rate, regular rhythm and normal heart sounds. Exam reveals no gallop and no friction rub.  No murmur heard. Pulmonary/Chest: Effort normal and  breath sounds normal. No respiratory distress. He has no wheezes. He has no rales. He exhibits no tenderness.  Abdominal: Soft. There is no tenderness. There is no guarding.  Musculoskeletal: Normal range of motion.  Lymphadenopathy:    He has no cervical adenopathy.  Neurological: He is alert and oriented to person, place, and time. No cranial nerve deficit.  Skin: Skin is warm and dry. He is not diaphoretic.  Psychiatric: He has a normal mood and affect. His behavior is normal. Judgment and thought content normal.  Nursing note and vitals reviewed.   Assessment/Plan: 1. Uncontrolled  type 2 diabetes mellitus with hyperglycemia (HCC) A1c today 5.7.   - POCT HgB A1C  2. Essential (primary) hypertension Controlled, continue current regimen.  3. OSA on CPAP Uses cpap every night. Denies complaints.    4. Morbid obesity ([place])  Obesity Counseling: Risk Assessment: An assessment of behavioral risk factors was made today and includes lack of exercise sedentary lifestyle, lack of portion control and poor dietary habits.  Risk Modification Advice: She was counseled on portion control guidelines. Restricting daily caloric intake to. . The detrimental long term effects of obesity on her health and ongoing poor compliance was also discussed with the patient.    General Counseling: Ewald verbalizes understanding of the findings of todays visit and agrees with plan of treatment. I have discussed any further diagnostic evaluation that may be needed or ordered today. We also reviewed his medications today. he has been encouraged to call the office with any questions or concerns that should arise related to todays visit.    Orders Placed This Encounter  Procedures  . POCT HgB A1C    No orders of the defined types were placed in this encounter.   Time spent: 25 Minutes   This patient was seen by Orson Gear AGNP-C in Collaboration with Dr Lavera Guise as a part of collaborative care  agreement    Dr Lavera Guise Internal medicine

## 2018-03-27 ENCOUNTER — Other Ambulatory Visit: Payer: Self-pay

## 2018-03-27 MED ORDER — PNEUMOCOCCAL 13-VAL CONJ VACC IM SUSP: 0.5000 mL | INTRAMUSCULAR | 0 refills | Status: AC

## 2018-03-29 LAB — POCT GLYCOSYLATED HEMOGLOBIN (HGB A1C): Hemoglobin A1C: 5.7 % — AB (ref 4.0–5.6)

## 2018-04-02 DIAGNOSIS — R69 Illness, unspecified: Secondary | ICD-10-CM | POA: Diagnosis not present

## 2018-04-03 ENCOUNTER — Telehealth: Payer: Self-pay

## 2018-04-03 NOTE — Telephone Encounter (Signed)
Faxed medicare quality patient assessment form

## 2018-05-07 ENCOUNTER — Other Ambulatory Visit: Payer: Self-pay | Admitting: Internal Medicine

## 2018-05-07 DIAGNOSIS — N401 Enlarged prostate with lower urinary tract symptoms: Secondary | ICD-10-CM

## 2018-05-07 MED ORDER — TERAZOSIN HCL 10 MG PO CAPS: 10.0000 mg | ORAL_CAPSULE | Freq: Every day | ORAL | 5 refills | Status: DC

## 2018-05-07 MED ORDER — FLUOXETINE HCL 10 MG PO CAPS: 10.0000 mg | ORAL_CAPSULE | Freq: Every day | ORAL | 3 refills | Status: DC

## 2018-05-10 ENCOUNTER — Telehealth: Payer: Self-pay | Admitting: Internal Medicine

## 2018-05-10 NOTE — Telephone Encounter (Signed)
MAILED [address] [place] [address].JW

## 2018-06-08 ENCOUNTER — Ambulatory Visit (INDEPENDENT_AMBULATORY_CARE_PROVIDER_SITE_OTHER): Payer: Medicare HMO | Admitting: Nurse Practitioner

## 2018-06-08 ENCOUNTER — Encounter: Payer: Self-pay | Admitting: Nurse Practitioner

## 2018-06-08 VITALS — BP 139/79 | HR 65 | Resp 16 | Ht 69.0 in | Wt 322.8 lb

## 2018-06-08 DIAGNOSIS — N401 Enlarged prostate with lower urinary tract symptoms: Secondary | ICD-10-CM | POA: Diagnosis not present

## 2018-06-08 DIAGNOSIS — I10 Essential (primary) hypertension: Secondary | ICD-10-CM | POA: Diagnosis not present

## 2018-06-08 DIAGNOSIS — F419 Anxiety disorder, unspecified: Secondary | ICD-10-CM | POA: Insufficient documentation

## 2018-06-08 DIAGNOSIS — M5000 Cervical disc disorder with myelopathy, unspecified cervical region: Secondary | ICD-10-CM | POA: Diagnosis not present

## 2018-06-08 DIAGNOSIS — M150 Primary generalized (osteo)arthritis: Secondary | ICD-10-CM | POA: Diagnosis not present

## 2018-06-08 DIAGNOSIS — F331 Major depressive disorder, recurrent, moderate: Secondary | ICD-10-CM

## 2018-06-08 DIAGNOSIS — N400 Enlarged prostate without lower urinary tract symptoms: Secondary | ICD-10-CM | POA: Insufficient documentation

## 2018-06-08 DIAGNOSIS — F329 Major depressive disorder, single episode, unspecified: Secondary | ICD-10-CM | POA: Insufficient documentation

## 2018-06-08 DIAGNOSIS — G629 Polyneuropathy, unspecified: Secondary | ICD-10-CM | POA: Insufficient documentation

## 2018-06-08 DIAGNOSIS — R69 Illness, unspecified: Secondary | ICD-10-CM | POA: Diagnosis not present

## 2018-06-08 DIAGNOSIS — F32A Depression, unspecified: Secondary | ICD-10-CM | POA: Insufficient documentation

## 2018-06-08 MED ORDER — TERAZOSIN HCL 10 MG PO CAPS: 10.0000 mg | ORAL_CAPSULE | Freq: Every day | ORAL | 5 refills | Status: DC

## 2018-06-08 MED ORDER — LOSARTAN POTASSIUM 25 MG PO TABS: 25.0000 mg | ORAL_TABLET | Freq: Every day | ORAL | 3 refills | Status: DC

## 2018-06-08 MED ORDER — FLUOXETINE HCL 10 MG PO CAPS: 10.0000 mg | ORAL_CAPSULE | Freq: Every day | ORAL | 3 refills | Status: DC

## 2018-06-08 MED ORDER — MELOXICAM 15 MG PO TABS: 15.0000 mg | ORAL_TABLET | Freq: Every day | ORAL | 3 refills | Status: DC

## 2018-06-08 NOTE — Progress Notes (Signed)
[facility] [place], [place] [address]  Internal MEDICINE  Office Visit Note  Patient Name: Jimmy Norton  [id number]  [id number]  Date of Service: 06/10/2018  Chief Complaint  Patient presents with  . Medical Management of Chronic Issues    3 month follow up, pt need medication refills  . Hypertension  . Hyperlipidemia  . Depression    pt states he is not taking the medication he does not notice a difference but spouse states everyone else notice    The patient is here for follow up exam. His wife has come with him to today's visit. She states that he has not been taking his medications like he is supposed to. He agrees with this. States that he just doesn't see where it is working. His wife disagrees with his assessment. She can definitely tell when he has not been taking his dose of prozac. States that his irritability is much worse and is almost immediately noticeable.  The patient states that he does have generalized joint pain. He does have arthritis in multiple joints. Has been hesitant to see orthopedic provider in the past.       Current Medication: Outpatient Encounter Medications as of 06/08/2018  Medication Sig  . cholecalciferol (VITAMIN D) 400 UNITS TABS tablet Take 400 Units by mouth daily.  . Cyanocobalamin (B-12) 2500 MCG TABS Take 1 tablet by mouth daily.  Marland Kitchen FLUoxetine (PROZAC) 10 MG capsule Take 1 capsule (10 mg total) by mouth daily.  Marland Kitchen glucosamine-chondroitin 500-400 MG tablet Take 1 tablet by mouth 2 (two) times daily.  Marland Kitchen losartan (COZAAR) 25 MG tablet Take 1 tablet (25 mg total) by mouth daily.  . Misc Natural Products (OSTEO BI-FLEX/5-LOXIN ADVANCED) TABS Take by mouth daily.  . Multiple Vitamins-Minerals (MULTIVITAMIN WITH MINERALS) tablet Take 1 tablet by mouth daily. Reported on 09/11/2015  . pyridoxine (B-6) 100 MG tablet Take 100 mg by mouth daily.  . Pyridoxine HCl (B-6) 100 MG TABS Take by mouth.  . terazosin (HYTRIN)  10 MG capsule Take 1 capsule (10 mg total) by mouth at bedtime.  Marland Kitchen tiZANidine (ZANAFLEX) 4 MG tablet Take by mouth.  . triamcinolone (KENALOG) 0.025 % ointment Apply 1 application topically 2 (two) times daily.  . [DISCONTINUED] FLUoxetine (PROZAC) 10 MG capsule Take 1 capsule (10 mg total) by mouth daily.  . [DISCONTINUED] losartan (COZAAR) 25 MG tablet TAKE 1 TABLET BY MOUTH ONCE DAILY  . [DISCONTINUED] terazosin (HYTRIN) 10 MG capsule Take 1 capsule (10 mg total) by mouth at bedtime.  . meloxicam (MOBIC) 15 MG tablet Take 1 tablet (15 mg total) by mouth daily.   No facility-administered encounter medications on file as of 06/08/2018.     Surgical History: Past Surgical History:  Procedure Laterality Date  . HAND SURGERY Bilateral   . HERNIA REPAIR     x 2  . NOSE SURGERY    . REPLACEMENT TOTAL KNEE      Medical History: Past Medical History:  Diagnosis Date  . Anxiety and depression   . Frequency   . Gallstones 03/05/2013  . Hypertension   . Kidney problem   May  2013  . Mixed hyperlipidemia   . OAB (overactive bladder)   . Obstructive sleep apnea (adult) (pediatric)     Family History: Family History  Problem Relation Age of Onset  . COPD Mother   . Heart disease Mother   . Cancer Father        Brain  tumor  . Lung cancer Paternal Uncle   . Cancer - Other Maternal Grandmother        Back  . Kidney disease Neg Hx   . Prostate cancer Neg Hx     Social History   Socioeconomic History  . Marital status: Married    Spouse name: Not on file  . Number of children: Not on file  . Years of education: Not on file  . Highest education level: Not on file  Occupational History  . Not on file  Social Needs  . Financial resource strain: Not on file  . Food insecurity:    Worry: Not on file    Inability: Not on file  . Transportation needs:    Medical: Not on file    Non-medical: Not on file  Tobacco Use  . Smoking status: Former Smoker    Types: Cigarettes  .  Smokeless tobacco: Current User    Types: Chew  Substance and Sexual Activity  . Alcohol use: No    Alcohol/week: 0.0 standard drinks  . Drug use: No  . Sexual activity: Not on file  Lifestyle  . Physical activity:    Days per week: Not on file    Minutes per session: Not on file  . Stress: Not on file  Relationships  . Social connections:    Talks on phone: Not on file    Gets together: Not on file    Attends religious service: Not on file    Active member of club or organization: Not on file    Attends meetings of clubs or organizations: Not on file    Relationship status: Not on file  . Intimate partner violence:    Fear of current or ex partner: Not on file    Emotionally abused: Not on file    Physically abused: Not on file    Forced sexual activity: Not on file  Other Topics Concern  . Not on file  Social History Narrative  . Not on file      Review of Systems  Constitutional: Positive for fatigue. Negative for activity change and unexpected weight change.  HENT: Negative for rhinorrhea, sore throat and voice change.   Eyes: Negative.   Respiratory: Negative for chest tightness, shortness of breath and wheezing.   Cardiovascular: Negative for chest pain and palpitations.  Gastrointestinal: Positive for constipation and diarrhea.       Rectal pressure especially when he has to urinate.   Endocrine: Negative for cold intolerance, heat intolerance, polydipsia, polyphagia and polyuria.  Genitourinary: Positive for frequency and urgency.       Nocturia. Bladder issues are becoming very disruptive.   Musculoskeletal: Positive for arthralgias, back pain, joint swelling and myalgias.  Skin:       Insect bit to right foream. Getting red and itchy.   Allergic/Immunologic: Negative for environmental allergies.  Neurological: Positive for headaches. Negative for dizziness.       History of migraines. Has had a few off and on, but nothing out of the ordinary.   Hematological: Negative.   Psychiatric/Behavioral: Negative for suicidal ideas.       Depression    Today's Vitals   06/08/18 1049  BP: 139/79  Pulse: 65  Resp: 16  SpO2: 96%  Weight: (!) 322 lb 12.8 oz (146.4 kg)  Height: 5\' 9"  (1.753 m)    Physical Exam  Constitutional: He is oriented to person, place, and time. He appears well-developed and well-nourished. No distress.  HENT:  Head: Normocephalic and atraumatic.  Mouth/Throat: No oropharyngeal exudate.  Eyes: Pupils are equal, round, and reactive to light. EOM are normal.  Neck: Neck supple. No JVD present. Spinous process tenderness and muscular tenderness present. Carotid bruit is not present. Decreased range of motion present. No tracheal deviation present. No thyromegaly present.  Cardiovascular: Normal rate, regular rhythm and normal heart sounds. Exam reveals no gallop and no friction rub.  No murmur heard. Pulmonary/Chest: Effort normal and breath sounds normal. No respiratory distress. He has no wheezes. He has no rales. He exhibits no tenderness.  Abdominal: Soft. Bowel sounds are normal.  Musculoskeletal:  Right shoulder tenderness with reduced ROM and strength. Right elbow mildly swollen with reduced ROM and strength. No visible abnormalities or deformities present.  Left ankle and left foot pain with mild to moderate swelling. Pain is worse when walking. He has reduced ROM and strength in left foot.  There is generalized joint pain without point tenderness present.   Lymphadenopathy:    He has no cervical adenopathy.  Neurological: He is alert and oriented to person, place, and time. No cranial nerve deficit.  Skin: Skin is warm and dry. He is not diaphoretic.  Psychiatric: His speech is normal and behavior is normal. Judgment and thought content normal. Cognition and memory are normal. He exhibits a depressed mood.  Nursing note and vitals reviewed.   Assessment/Plan: 1. Essential (primary)  hypertension Recommend he continue losartan 25mg  daily. Recommended he avoid excess salt and increase water intake in his diet.  - losartan (COZAAR) 25 MG tablet; Take 1 tablet (25 mg total) by mouth daily.  Dispense: 30 tablet; Refill: 3  2. Primary generalized (osteo)arthritis Add meloxicam 15mg  daily to help reduce pain and inflammation. Refer to orthopedics for further evaluation and treatment.  - meloxicam (MOBIC) 15 MG tablet; Take 1 tablet (15 mg total) by mouth daily.  Dispense: 30 tablet; Refill: 3 - Ambulatory referral to Orthopedic Surgery  3. Cervical disc disease with myelopathy Add meloxicam 15mg  daily to help reduce pain and inflammation. Refer to orthopedics for further evaluation and treatment.  - Ambulatory referral to Orthopedic Surgery  4. Benign prostatic hyperplasia with lower urinary tract symptoms, symptom details unspecified May continue terazosin 10mg  daily. Recommend he follow up with urology as scheduled.  - terazosin (HYTRIN) 10 MG capsule; Take 1 capsule (10 mg total) by mouth at bedtime.  Dispense: 30 capsule; Refill: 5  5. Major depressive disorder, recurrent, moderate (HCC) Restart fluoxetine 10mg  capsules. Reassess at next visit. - FLUoxetine (PROZAC) 10 MG capsule; Take 1 capsule (10 mg total) by mouth daily.  Dispense: 30 capsule; Refill: 3  General Counseling: Glade verbalizes understanding of the findings of todays visit and agrees with plan of treatment. I have discussed any further diagnostic evaluation that may be needed or ordered today. We also reviewed his medications today. he has been encouraged to call the office with any questions or concerns that should arise related to todays visit.  Hypertension Counseling:   The following hypertensive lifestyle modification were recommended and discussed:  1. Limiting alcohol intake to less than 1 oz/day of ethanol:(24 oz of beer or 8 oz of wine or 2 oz of 100-proof whiskey). 2. Take baby ASA 81 mg  daily. 3. Importance of regular aerobic exercise and losing weight. 4. Reduce dietary saturated fat and cholesterol intake for overall cardiovascular health. 5. Maintaining adequate dietary potassium, calcium, and magnesium intake. 6. Regular monitoring of the blood pressure. 7. Reduce sodium  intake to less than 100 mmol/day (less than 2.3 gm of sodium or less than 6 gm of sodium choride)   This patient was seen by [place] with Dr Lavera Guise as a part of collaborative care agreement  Orders Placed This Encounter  Procedures  . Ambulatory referral to Orthopedic Surgery    Meds ordered this encounter  Medications  . FLUoxetine (PROZAC) 10 MG capsule    Sig: Take 1 capsule (10 mg total) by mouth daily.    Dispense:  30 capsule    Refill:  3    Order Specific Question:   Supervising Provider    Answer:   Lavera Guise [[id number]]  . losartan (COZAAR) 25 MG tablet    Sig: Take 1 tablet (25 mg total) by mouth daily.    Dispense:  30 tablet    Refill:  3    Order Specific Question:   Supervising Provider    Answer:   Lavera Guise [[id number]]  . terazosin (HYTRIN) 10 MG capsule    Sig: Take 1 capsule (10 mg total) by mouth at bedtime.    Dispense:  30 capsule    Refill:  5    Order Specific Question:   Supervising Provider    Answer:   Lavera Guise [[id number]]  . meloxicam (MOBIC) 15 MG tablet    Sig: Take 1 tablet (15 mg total) by mouth daily.    Dispense:  30 tablet    Refill:  3    Order Specific Question:   Supervising Provider    Answer:   Lavera Guise [[id number]]    Time spent: 37 Minutes      Dr Lavera Guise Internal medicine

## 2018-06-10 DIAGNOSIS — M150 Primary generalized (osteo)arthritis: Secondary | ICD-10-CM | POA: Insufficient documentation

## 2018-06-10 DIAGNOSIS — M5000 Cervical disc disorder with myelopathy, unspecified cervical region: Secondary | ICD-10-CM | POA: Insufficient documentation

## 2018-06-20 ENCOUNTER — Encounter: Payer: Self-pay | Admitting: Nurse Practitioner

## 2018-07-02 ENCOUNTER — Other Ambulatory Visit: Payer: Self-pay | Admitting: Orthopedic Surgery

## 2018-07-02 DIAGNOSIS — M1811 Unilateral primary osteoarthritis of first carpometacarpal joint, right hand: Secondary | ICD-10-CM | POA: Insufficient documentation

## 2018-07-02 DIAGNOSIS — M5412 Radiculopathy, cervical region: Secondary | ICD-10-CM

## 2018-07-02 DIAGNOSIS — M542 Cervicalgia: Secondary | ICD-10-CM | POA: Diagnosis not present

## 2018-07-12 DIAGNOSIS — M5021 Other cervical disc displacement,  high cervical region: Secondary | ICD-10-CM | POA: Diagnosis not present

## 2018-07-12 DIAGNOSIS — M5412 Radiculopathy, cervical region: Secondary | ICD-10-CM | POA: Diagnosis not present

## 2018-07-12 DIAGNOSIS — M47812 Spondylosis without myelopathy or radiculopathy, cervical region: Secondary | ICD-10-CM | POA: Diagnosis not present

## 2018-07-30 ENCOUNTER — Ambulatory Visit (INDEPENDENT_AMBULATORY_CARE_PROVIDER_SITE_OTHER): Payer: Medicare HMO | Admitting: Nurse Practitioner

## 2018-07-30 ENCOUNTER — Encounter: Payer: Self-pay | Admitting: Nurse Practitioner

## 2018-07-30 VITALS — BP 139/77 | HR 68 | Resp 16 | Ht 69.0 in | Wt 330.8 lb

## 2018-07-30 DIAGNOSIS — Z13220 Encounter for screening for lipoid disorders: Secondary | ICD-10-CM | POA: Diagnosis not present

## 2018-07-30 DIAGNOSIS — Z0001 Encounter for general adult medical examination with abnormal findings: Secondary | ICD-10-CM | POA: Diagnosis not present

## 2018-07-30 DIAGNOSIS — R300 Dysuria: Secondary | ICD-10-CM | POA: Insufficient documentation

## 2018-07-30 DIAGNOSIS — Z125 Encounter for screening for malignant neoplasm of prostate: Secondary | ICD-10-CM | POA: Diagnosis not present

## 2018-07-30 DIAGNOSIS — R5383 Other fatigue: Secondary | ICD-10-CM | POA: Diagnosis not present

## 2018-07-30 DIAGNOSIS — E079 Disorder of thyroid, unspecified: Secondary | ICD-10-CM | POA: Diagnosis not present

## 2018-07-30 DIAGNOSIS — I10 Essential (primary) hypertension: Secondary | ICD-10-CM

## 2018-07-30 DIAGNOSIS — Z131 Encounter for screening for diabetes mellitus: Secondary | ICD-10-CM | POA: Diagnosis not present

## 2018-07-30 DIAGNOSIS — E291 Testicular hypofunction: Secondary | ICD-10-CM | POA: Diagnosis not present

## 2018-07-30 DIAGNOSIS — R5381 Other malaise: Secondary | ICD-10-CM | POA: Diagnosis not present

## 2018-07-30 DIAGNOSIS — N390 Urinary tract infection, site not specified: Secondary | ICD-10-CM | POA: Diagnosis not present

## 2018-07-30 NOTE — Progress Notes (Signed)
[facility] [place], [place] [address]  Internal MEDICINE  Office Visit Note  Patient Name: Jimmy Norton  [id number]  [id number]  Date of Service: 07/30/2018   Pt is here for routine health maintenance examination  Chief Complaint  Patient presents with  . Medicare Wellness    well visit  . Hypertension  . Hyperlipidemia     The patient is here for health maintenance exam. She has pain and numbness in both arms. Has seen orthopedic provider who ordered MRI of the cervical spine. 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 He is due to see neurosurgeon 08/02/2018. Weakness in his arms has become so severe, he cannot grasp a coffee cup or bowl because grip strength is not there.  He is due to have routine, fasting labs done. Blood pressure is well controlled.     Current Medication: Outpatient Encounter Medications as of 07/30/2018  Medication Sig  . cholecalciferol (VITAMIN D) 400 UNITS TABS tablet Take 400 Units by mouth daily.  . Cyanocobalamin (B-12) 2500 MCG TABS Take 1 tablet by mouth daily.  Marland Kitchen FLUoxetine (PROZAC) 10 MG capsule Take 1 capsule (10 mg total) by mouth daily.  Marland Kitchen glucosamine-chondroitin 500-400 MG tablet Take 1 tablet by mouth 2 (two) times daily.  Marland Kitchen losartan (COZAAR) 25 MG tablet Take 1 tablet (25 mg total) by mouth daily.  . meloxicam (MOBIC) 15 MG tablet Take 1 tablet (15 mg total) by mouth daily.  . Multiple Vitamins-Minerals (MULTIVITAMIN WITH MINERALS) tablet Take 1 tablet by mouth daily. Reported on 09/11/2015  . Probiotic Product (PROBIOTIC-10 PO) Take by mouth.  . pyridoxine (B-6) 100 MG tablet Take 100 mg by mouth daily.  . Pyridoxine HCl (B-6) 100 MG TABS Take by mouth.  . terazosin (HYTRIN) 10 MG capsule Take 1 capsule (10 mg total) by mouth at bedtime.  Marland Kitchen tiZANidine (ZANAFLEX) 4 MG tablet Take by mouth.  . triamcinolone (KENALOG) 0.025 % ointment Apply 1 application topically 2 (two) times  daily.  . Misc Natural Products (OSTEO BI-FLEX/5-LOXIN ADVANCED) TABS Take by mouth daily.   No facility-administered encounter medications on file as of 07/30/2018.     Surgical History: Past Surgical History:  Procedure Laterality Date  . HAND SURGERY Bilateral   . HERNIA REPAIR     x 2  . NOSE SURGERY    . REPLACEMENT TOTAL KNEE      Medical History: Past Medical History:  Diagnosis Date  . Anxiety and depression   . Frequency   . Gallstones 03/05/2013  . Hypertension   . Kidney problem   May  2013  . Mixed hyperlipidemia   . OAB (overactive bladder)   . Obstructive sleep apnea (adult) (pediatric)     Family History: Family History  Problem Relation Age of Onset  . COPD Mother   . Heart disease Mother   . Cancer Father        Brain tumor  . Lung cancer Paternal Uncle   . Cancer - Other Maternal Grandmother        Back  . Kidney disease Neg Hx   . Prostate cancer Neg Hx       Review of Systems  Constitutional: Positive for fatigue. Negative for activity change and unexpected weight change.  HENT: Negative for rhinorrhea, sore throat and voice change.   Respiratory: Negative for chest tightness, shortness of breath and wheezing.   Cardiovascular: Negative for chest pain and  palpitations.  Gastrointestinal: Positive for constipation and diarrhea.          Endocrine: Negative for cold intolerance, heat intolerance, polydipsia, polyphagia and polyuria.  Genitourinary: Positive for frequency and urgency.       Nocturia. Bladder issues are becoming very disruptive.   Musculoskeletal: Positive for arthralgias, back pain, joint swelling, myalgias and neck pain.  Skin: Negative for rash.  Allergic/Immunologic: Negative for environmental allergies.  Neurological: Positive for headaches. Negative for dizziness.       History of migraines. Has had a few off and on, but nothing out of the ordinary.  Hematological: Negative for adenopathy.  Psychiatric/Behavioral:  Positive for dysphoric mood. Negative for suicidal ideas.       Mild, chronic depression. Not currently taking sertraline.      Today's Vitals   07/30/18 0855  BP: 139/77  Pulse: 68  Resp: 16  SpO2: 94%  Weight: (!) 330 lb 12.8 oz (150 kg)  Height: 5\' 9"  (1.753 m)    Physical Exam Vitals signs and nursing note reviewed.  Constitutional:      General: He is not in acute distress.    Appearance: He is well-developed. He is obese. He is not diaphoretic.  HENT:     Head: Normocephalic and atraumatic.     Mouth/Throat:     Pharynx: No oropharyngeal exudate.  Eyes:     Pupils: Pupils are equal, round, and reactive to light.  Neck:     Musculoskeletal: Neck supple. Decreased range of motion. Spinous process tenderness and muscular tenderness present.     Thyroid: No thyromegaly.     Vascular: No carotid bruit or JVD.     Trachea: No tracheal deviation.  Cardiovascular:     Rate and Rhythm: Normal rate and regular rhythm.     Heart sounds: Normal heart sounds. No murmur. No friction rub. No gallop.   Pulmonary:     Effort: Pulmonary effort is normal. No respiratory distress.     Breath sounds: Normal breath sounds. No wheezing or rales.  Chest:     Chest wall: No tenderness.  Abdominal:     General: Bowel sounds are normal.     Palpations: Abdomen is soft.  Musculoskeletal:     Comments: Right shoulder tenderness with reduced ROM and strength. Right elbow mildly swollen with reduced ROM and strength. No visible abnormalities or deformities present.  Left ankle and left foot pain with mild to moderate swelling. Pain is worse when walking. He has reduced ROM and strength in left foot.  There is generalized joint pain without point tenderness present.   Lymphadenopathy:     Cervical: No cervical adenopathy.  Skin:    General: Skin is warm and dry.  Neurological:     Mental Status: He is alert and oriented to person, place, and time.     Cranial Nerves: No cranial nerve  deficit.  Psychiatric:        Mood and Affect: Mood is depressed.        Speech: Speech normal.        Behavior: Behavior normal.        Thought Content: Thought content normal.        Judgment: Judgment normal.    Depression screen [facility] 2/9 07/30/2018 06/08/2018 03/08/2018 11/27/2017 10/16/2017  Decreased Interest 1 3 0 0 1  Down, Depressed, Hopeless 3 1 0 0 1  PHQ - 2 Score 4 4 0 0 2  Altered sleeping 3 3 - - 0  Tired, decreased energy 1 3 - - 1  Change in appetite 1 1 - - 1  Feeling bad or failure about yourself  1 1 - - 1  Trouble concentrating - 1 - - 2  Moving slowly or fidgety/restless 1 0 - - 1  Suicidal thoughts 0 0 - - 0  PHQ-9 Score 11 13 - - 8  Difficult doing work/chores - - - - Not difficult at all    Functional Status Survey: Is the patient deaf or have difficulty hearing?: Yes(patient has hearing aides sometimes cant understand what he hears. ) Does the patient have difficulty seeing, even when wearing glasses/contacts?: Yes(pt has dry eyes sometimes causes blurred vision) Does the patient have difficulty concentrating, remembering, or making decisions?: Yes(memory, concentration isnt the way it use to be) Does the patient have difficulty walking or climbing stairs?: Yes Does the patient have difficulty dressing or bathing?: No Does the patient have difficulty doing errands alone such as visiting a doctor's office or shopping?: No  MMSE - [place] Exam 07/30/2018  Orientation to time 5  Orientation to Place 5  Registration 3  Attention/ Calculation 5  Recall 3  Language- name 2 objects 2  Language- repeat 1  Language- follow 3 step command 3  Language- read & follow direction 1  Write a sentence 1  Copy design 1  Total score 30    Fall Risk  07/30/2018 06/08/2018 03/08/2018 11/27/2017 10/16/2017  Falls in the past year? 0 0 No No No  Number falls in past yr: - - - - -  Injury with Fall? - - - - -   Assessment/Plan:  1. Encounter for general adult  medical examination with abnormal findings Annual health maintenance exam today. Routine, fasting labs ordered.  - CBC with Differential/Platelet  2. Essential (primary) hypertension Continue BP medication as prescribed.  - CBC with Differential/Platelet - Comprehensive metabolic panel - Lipid panel - TSH  3. Testicular hypofunction Check PSA and testosterone levels. Will treat testosterone levels as indicated.  - PSA - Testosterone,Free and Total  4. Screening for prostate cancer - PSA  5. Dysuria - UA/M w/rflx Culture, Routine  General Counseling: Reyhan verbalizes understanding of the findings of todays visit and agrees with plan of treatment. I have discussed any further diagnostic evaluation that may be needed or ordered today. We also reviewed his medications today. he has been encouraged to call the office with any questions or concerns that should arise related to todays visit.    Counseling:  Hypertension Counseling:   The following hypertensive lifestyle modification were recommended and discussed:  1. Limiting alcohol intake to less than 1 oz/day of ethanol:(24 oz of beer or 8 oz of wine or 2 oz of 100-proof whiskey). 2. Take baby ASA 81 mg daily. 3. Importance of regular aerobic exercise and losing weight. 4. Reduce dietary saturated fat and cholesterol intake for overall cardiovascular health. 5. Maintaining adequate dietary potassium, calcium, and magnesium intake. 6. Regular monitoring of the blood pressure. 7. Reduce sodium intake to less than 100 mmol/day (less than 2.3 gm of sodium or less than 6 gm of sodium choride)   This patient was seen by [place] with Dr Lavera Guise as a part of collaborative care agreement  Orders Placed This Encounter  Procedures  . UA/M w/rflx Culture, Routine  . PSA  . CBC with Differential/Platelet  . Comprehensive metabolic panel  . Lipid panel  . TSH  . Testosterone,Free  and Total     Time  spent: [place], MD  Internal Medicine

## 2018-07-31 ENCOUNTER — Other Ambulatory Visit: Payer: Self-pay | Admitting: Nurse Practitioner

## 2018-07-31 ENCOUNTER — Telehealth: Payer: Self-pay

## 2018-07-31 DIAGNOSIS — E291 Testicular hypofunction: Secondary | ICD-10-CM

## 2018-07-31 LAB — CBC WITH DIFFERENTIAL/PLATELET
Basophils Absolute: 0 10*3/uL (ref 0.0–0.2)
Basos: 1 %
EOS (ABSOLUTE): 0.2 10*3/uL (ref 0.0–0.4)
Eos: 2 %
Hematocrit: 45.4 % (ref 37.5–51.0)
Hemoglobin: 15.4 g/dL (ref 13.0–17.7)
Immature Grans (Abs): 0 10*3/uL (ref 0.0–0.1)
Immature Granulocytes: 0 %
Lymphocytes Absolute: 1.7 10*3/uL (ref 0.7–3.1)
Lymphs: 26 %
MCH: 28.8 pg (ref 26.6–33.0)
MCHC: 33.9 g/dL (ref 31.5–35.7)
MCV: 85 fL (ref 79–97)
Monocytes Absolute: 0.7 10*3/uL (ref 0.1–0.9)
Monocytes: 12 %
Neutrophils Absolute: 3.8 10*3/uL (ref 1.4–7.0)
Neutrophils: 59 %
Platelets: 220 10*3/uL (ref 150–450)
RBC: 5.34 x10E6/uL (ref 4.14–5.80)
RDW: 13.1 % (ref 11.6–15.4)
WBC: 6.4 10*3/uL (ref 3.4–10.8)

## 2018-07-31 LAB — LIPID PANEL
CHOL/HDL RATIO: 3.9 ratio (ref 0.0–5.0)
Cholesterol, Total: 144 mg/dL (ref 100–199)
HDL: 37 mg/dL — ABNORMAL LOW (ref >39)
LDL Calculated: 61 mg/dL (ref 0–99)
Triglycerides: 231 mg/dL — ABNORMAL HIGH (ref 0–149)
VLDL Cholesterol Cal: 46 mg/dL — ABNORMAL HIGH (ref 5–40)

## 2018-07-31 LAB — COMPREHENSIVE METABOLIC PANEL
ALBUMIN: 4.1 g/dL (ref 3.5–4.8)
ALT: 31 IU/L (ref 0–44)
AST: 26 IU/L (ref 0–40)
Albumin/Globulin Ratio: 1.7 (ref 1.2–2.2)
Alkaline Phosphatase: 90 IU/L (ref 39–117)
BUN/Creatinine Ratio: 15 (ref 10–24)
BUN: 14 mg/dL (ref 8–27)
Bilirubin Total: 0.4 mg/dL (ref 0.0–1.2)
CO2: 22 mmol/L (ref 20–29)
CREATININE: 0.93 mg/dL (ref 0.76–1.27)
Calcium: 9.5 mg/dL (ref 8.6–10.2)
Chloride: 104 mmol/L (ref 96–106)
GFR calc Af Amer: 94 mL/min/{1.73_m2} (ref >59)
GFR, EST NON AFRICAN AMERICAN: 82 mL/min/{1.73_m2} (ref >59)
Globulin, Total: 2.4 g/dL (ref 1.5–4.5)
Glucose: 101 mg/dL — ABNORMAL HIGH (ref 65–99)
Potassium: 4.4 mmol/L (ref 3.5–5.2)
Sodium: 141 mmol/L (ref 134–144)
Total Protein: 6.5 g/dL (ref 6.0–8.5)

## 2018-07-31 LAB — UA/M W/RFLX CULTURE, ROUTINE
BILIRUBIN UA: NEGATIVE
GLUCOSE, UA: NEGATIVE
Ketones, UA: NEGATIVE
Leukocytes, UA: NEGATIVE
NITRITE UA: NEGATIVE
PH UA: 5 (ref 5.0–7.5)
Protein, UA: NEGATIVE
RBC UA: NEGATIVE
Specific Gravity, UA: 1.019 (ref 1.005–1.030)
Urobilinogen, Ur: 0.2 mg/dL (ref 0.2–1.0)

## 2018-07-31 LAB — MICROSCOPIC EXAMINATION: Bacteria, UA: NONE SEEN

## 2018-07-31 LAB — PSA: Prostate Specific Ag, Serum: 0.4 ng/mL (ref 0.0–4.0)

## 2018-07-31 LAB — TSH: TSH: 2.88 u[IU]/mL (ref 0.450–4.500)

## 2018-07-31 LAB — T4, FREE: Free T4: 0.88 ng/dL (ref 0.82–1.77)

## 2018-07-31 LAB — HGB A1C W/O EAG: Hgb A1c MFr Bld: 5.8 % — ABNORMAL HIGH (ref 4.8–5.6)

## 2018-07-31 LAB — TESTOSTERONE: Testosterone: 158 ng/dL — ABNORMAL LOW (ref 264–916)

## 2018-07-31 MED ORDER — TESTOSTERONE 25 MG/2.5GM (1%) TD GEL: TRANSDERMAL | 3 refills | Status: DC

## 2018-07-31 NOTE — Telephone Encounter (Signed)
Informed pt of results and that new prescription was at the pharmacy and will discuss more details at next visit per Nira Conn, NP

## 2018-07-31 NOTE — Progress Notes (Signed)
Testosterone low on labs. Will try androgel, gel applied under each arm daily, to help increase testosterone levels. New prescription for this was sent to [facility] [address]. Other labs were pretty good and we will discuss in more detail at next visit.

## 2018-08-01 ENCOUNTER — Other Ambulatory Visit: Payer: Self-pay | Admitting: Nurse Practitioner

## 2018-08-01 DIAGNOSIS — E291 Testicular hypofunction: Secondary | ICD-10-CM

## 2018-08-01 MED ORDER — TESTOSTERONE 12.5 MG/ACT (1%) TD GEL: TRANSDERMAL | 3 refills | Status: DC

## 2018-08-01 NOTE — Progress Notes (Signed)
Changed androgel prescription to 12.5mg  act, using one pump under each arm daily. Will d/c prior order for 25mg /2.5gm gel

## 2018-08-02 DIAGNOSIS — Z6841 Body Mass Index (BMI) 40.0 and over, adult: Secondary | ICD-10-CM | POA: Diagnosis not present

## 2018-08-02 DIAGNOSIS — M4712 Other spondylosis with myelopathy, cervical region: Secondary | ICD-10-CM | POA: Diagnosis not present

## 2018-08-06 ENCOUNTER — Telehealth: Payer: Self-pay

## 2018-08-06 ENCOUNTER — Ambulatory Visit: Payer: Medicare HMO | Admitting: Internal Medicine

## 2018-08-06 ENCOUNTER — Encounter: Payer: Self-pay | Admitting: Internal Medicine

## 2018-08-06 VITALS — BP 148/82 | HR 81 | Resp 16 | Ht 69.0 in | Wt 331.4 lb

## 2018-08-06 DIAGNOSIS — G4733 Obstructive sleep apnea (adult) (pediatric): Secondary | ICD-10-CM | POA: Diagnosis not present

## 2018-08-06 DIAGNOSIS — M4802 Spinal stenosis, cervical region: Secondary | ICD-10-CM | POA: Diagnosis not present

## 2018-08-06 NOTE — Telephone Encounter (Signed)
Jimmy Norton Patient cpap rx set up for new cpap. [name]

## 2018-08-06 NOTE — Progress Notes (Signed)
[facility] [place], [place] [address]  Pulmonary Sleep Medicine   Office Visit Note  Patient Name: Jimmy Norton. DOB: 08/25/1945 MRN [id number]  Date of Service: 08/06/2018  Complaints/HPI: OSA Obesity Cervical stenosis.  Patient told me that he has been seeing neurosurgery for possible cervical stenosis which is at the [facility].  The patient has not yet gotten his new CPAP device.  I spoke with our staff in regard to try to get this taken care of.  The patient had insurance change as well as some other technical issues which did not allow [place] to give him the machine.  He is doing okay otherwise no major issues other than his weight.  I spoke to him about losing 50 pounds which he needs to work on.  Indeed his surgeon is also spoke to him about losing weight he is currently going to try physical therapy for his cervical stenosis.  ROS  General: (-) fever, (-) chills, (-) night sweats, (-) weakness Skin: (-) rashes, (-) itching,. Eyes: (-) visual changes, (-) redness, (-) itching. Nose and Sinuses: (-) nasal stuffiness or itchiness, (-) postnasal drip, (-) nosebleeds, (-) sinus trouble. Mouth and Throat: (-) sore throat, (-) hoarseness. Neck: (-) swollen glands, (-) enlarged thyroid, (-) neck pain. Respiratory: - cough, (-) bloody sputum, + shortness of breath, - wheezing. Cardiovascular: - ankle swelling, (-) chest pain. Lymphatic: (-) lymph node enlargement. Neurologic: (-) numbness, (-) tingling. Psychiatric: (-) anxiety, (-) depression   Current Medication: Outpatient Encounter Medications as of 08/06/2018  Medication Sig  . cholecalciferol (VITAMIN D) 400 UNITS TABS tablet Take 400 Units by mouth daily.  . Cyanocobalamin (B-12) 2500 MCG TABS Take 1 tablet by mouth daily.  Marland Kitchen FLUoxetine (PROZAC) 10 MG capsule Take 1 capsule (10 mg total) by mouth daily.  Marland Kitchen glucosamine-chondroitin 500-400 MG tablet Take 1 tablet by mouth 2 (two) times daily.  Marland Kitchen  losartan (COZAAR) 25 MG tablet Take 1 tablet (25 mg total) by mouth daily.  . meloxicam (MOBIC) 15 MG tablet Take 1 tablet (15 mg total) by mouth daily.  . Misc Natural Products (OSTEO BI-FLEX/5-LOXIN ADVANCED) TABS Take by mouth daily.  . Multiple Vitamins-Minerals (MULTIVITAMIN WITH MINERALS) tablet Take 1 tablet by mouth daily. Reported on 09/11/2015  . Probiotic Product (PROBIOTIC-10 PO) Take by mouth.  . pyridoxine (B-6) 100 MG tablet Take 100 mg by mouth daily.  . Pyridoxine HCl (B-6) 100 MG TABS Take by mouth.  . terazosin (HYTRIN) 10 MG capsule Take 1 capsule (10 mg total) by mouth at bedtime.  . Testosterone 12.5 MG/ACT (1%) GEL Use I pump transdermal, under each arm QD  . tiZANidine (ZANAFLEX) 4 MG tablet Take by mouth.  . triamcinolone (KENALOG) 0.025 % ointment Apply 1 application topically 2 (two) times daily.  . [DISCONTINUED] amLODipine (NORVASC) 10 MG tablet Take by mouth.  . [DISCONTINUED] cilostazol (PLETAL) 50 MG tablet Take by mouth.  . [DISCONTINUED] fenofibrate 54 MG tablet Take by mouth.  . [DISCONTINUED] FLUoxetine (PROZAC) 10 MG capsule Take 1 capsule (10 mg total) by mouth daily.  . [DISCONTINUED] hyoscyamine (LEVSIN, ANASPAZ) 0.125 MG tablet Take by mouth.  . [DISCONTINUED] losartan (COZAAR) 25 MG tablet Take 25 mg by mouth daily.  . [DISCONTINUED] sertraline (ZOLOFT) 25 MG tablet Take 25 mg by mouth 2 (two) times daily.  . [DISCONTINUED] terazosin (HYTRIN) 10 MG capsule Take 1 capsule (10 mg total) by mouth at bedtime.   No facility-administered encounter medications on file  as of 08/06/2018.     Surgical History: Past Surgical History:  Procedure Laterality Date  . HAND SURGERY Bilateral   . HERNIA REPAIR     x 2  . NOSE SURGERY    . REPLACEMENT TOTAL KNEE      Medical History: Past Medical History:  Diagnosis Date  . Anxiety and depression   . Frequency   . Gallstones 03/05/2013  . Hypertension   . Kidney problem   May  2013  . Mixed hyperlipidemia    . OAB (overactive bladder)   . Obstructive sleep apnea (adult) (pediatric)     Family History: Family History  Problem Relation Age of Onset  . COPD Mother   . Heart disease Mother   . Cancer Father        Brain tumor  . Lung cancer Paternal Uncle   . Cancer - Other Maternal Grandmother        Back  . Kidney disease Neg Hx   . Prostate cancer Neg Hx     Social History: Social History   Socioeconomic History  . Marital status: Married    Spouse name: Not on file  . Number of children: Not on file  . Years of education: Not on file  . Highest education level: Not on file  Occupational History  . Not on file  Social Needs  . Financial resource strain: Not on file  . Food insecurity:    Worry: Not on file    Inability: Not on file  . Transportation needs:    Medical: Not on file    Non-medical: Not on file  Tobacco Use  . Smoking status: Former Smoker    Types: Cigarettes  . Smokeless tobacco: Current User    Types: Chew  Substance and Sexual Activity  . Alcohol use: No    Alcohol/week: 0.0 standard drinks  . Drug use: No  . Sexual activity: Not on file  Lifestyle  . Physical activity:    Days per week: Not on file    Minutes per session: Not on file  . Stress: Not on file  Relationships  . Social connections:    Talks on phone: Not on file    Gets together: Not on file    Attends religious service: Not on file    Active member of club or organization: Not on file    Attends meetings of clubs or organizations: Not on file    Relationship status: Not on file  . Intimate partner violence:    Fear of current or ex partner: Not on file    Emotionally abused: Not on file    Physically abused: Not on file    Forced sexual activity: Not on file  Other Topics Concern  . Not on file  Social History Narrative  . Not on file    Vital Signs: Blood pressure (!) 148/82, pulse 81, resp. rate 16, height 5\' 9"  (1.753 m), weight (!) 331 lb 6.4 oz (150.3 kg), SpO2  96 %.  Examination: General Appearance: The patient is well-developed, well-nourished, and in no distress. Skin: Gross inspection of skin unremarkable. Head: normocephalic, no gross deformities. Eyes: no gross deformities noted. ENT: ears appear grossly normal no exudates. Neck: Supple. No thyromegaly. No LAD. Respiratory: no rhonchi noted. Cardiovascular: Normal S1 and S2 without murmur or rub. Extremities: No cyanosis. pulses are equal. Neurologic: Alert and oriented. No involuntary movements.  LABS: Recent Results (from the past 2160 hour(s))  UA/M w/rflx Culture, Routine  Status: None   Collection Time: 07/30/18  8:49 AM  Result Value Ref Range   Specific Gravity, UA 1.019 1.005 - 1.030   pH, UA 5.0 5.0 - 7.5   Color, UA Yellow Yellow   Appearance Ur Clear Clear   Leukocytes, UA Negative Negative   Protein, UA Negative Negative/Trace   Glucose, UA Negative Negative   Ketones, UA Negative Negative   RBC, UA Negative Negative   Bilirubin, UA Negative Negative   Urobilinogen, Ur 0.2 0.2 - 1.0 mg/dL   Nitrite, UA Negative Negative   Microscopic Examination Comment     Comment: Microscopic follows if indicated.   Microscopic Examination See below:     Comment: Microscopic was indicated and was performed.   Urinalysis Reflex Comment     Comment: This specimen will not reflex to a Urine Culture.  Microscopic Examination     Status: Abnormal   Collection Time: 07/30/18  8:49 AM  Result Value Ref Range   WBC, UA 0-5 0 - 5 /hpf   RBC, UA 0-2 0 - 2 /hpf   Epithelial Cells (non renal) 0-10 0 - 10 /hpf   Casts Present (A) None seen /lpf   Cast Type Hyaline casts N/A   Mucus, UA Present Not Estab.   Bacteria, UA None seen None seen/Few  PSA     Status: None   Collection Time: 07/30/18 10:06 AM  Result Value Ref Range   Prostate Specific Ag, Serum 0.4 0.0 - 4.0 ng/mL    Comment: Roche ECLIA methodology. According to the American Urological Association, Serum PSA  should decrease and remain at undetectable levels after radical prostatectomy. The AUA defines biochemical recurrence as an initial PSA value 0.2 ng/mL or greater followed by a subsequent confirmatory PSA value 0.2 ng/mL or greater. Values obtained with different assay methods or kits cannot be used interchangeably. Results cannot be interpreted as absolute evidence of the presence or absence of malignant disease.   CBC with Differential/Platelet     Status: None   Collection Time: 07/30/18 10:06 AM  Result Value Ref Range   WBC 6.4 3.4 - 10.8 x10E3/uL   RBC 5.34 4.14 - 5.80 x10E6/uL   Hemoglobin 15.4 13.0 - 17.7 g/dL   Hematocrit 45.4 37.5 - 51.0 %   MCV 85 79 - 97 fL   MCH 28.8 26.6 - 33.0 pg   MCHC 33.9 31.5 - 35.7 g/dL   RDW 13.1 11.6 - 15.4 %    Comment:               **Please note reference interval change**   Platelets 220 150 - 450 x10E3/uL   Neutrophils 59 Not Estab. %   Lymphs 26 Not Estab. %   Monocytes 12 Not Estab. %   Eos 2 Not Estab. %   Basos 1 Not Estab. %   Neutrophils Absolute 3.8 1.4 - 7.0 x10E3/uL   Lymphocytes Absolute 1.7 0.7 - 3.1 x10E3/uL   Monocytes Absolute 0.7 0.1 - 0.9 x10E3/uL   EOS (ABSOLUTE) 0.2 0.0 - 0.4 x10E3/uL   Basophils Absolute 0.0 0.0 - 0.2 x10E3/uL   Immature Granulocytes 0 Not Estab. %   Immature Grans (Abs) 0.0 0.0 - 0.1 x10E3/uL  Comprehensive metabolic panel     Status: Abnormal   Collection Time: 07/30/18 10:06 AM  Result Value Ref Range   Glucose 101 (H) 65 - 99 mg/dL   BUN 14 8 - 27 mg/dL   Creatinine, Ser 0.93 0.76 - 1.27 mg/dL  GFR calc non Af Amer 82 >59 mL/min/1.73   GFR calc Af Amer 94 >59 mL/min/1.73   BUN/Creatinine Ratio 15 10 - 24   Sodium 141 134 - 144 mmol/L   Potassium 4.4 3.5 - 5.2 mmol/L   Chloride 104 96 - 106 mmol/L   CO2 22 20 - 29 mmol/L   Calcium 9.5 8.6 - 10.2 mg/dL   Total Protein 6.5 6.0 - 8.5 g/dL   Albumin 4.1 3.5 - 4.8 g/dL    Comment:     **Effective August 13, 2018 Albumin reference**        interval will be changing to:              Age                Male          Male           0 -  7 days        3.6 - 4.9      3.6 - 4.9           8 - 30 days        3.4 - 4.7      3.4 - 4.7           1 -  6 month       3.7 - 4.8      3.7 - 4.8    7 months -  2 years       3.9 - 5.0      3.9 - 5.0           3 -  5 years       4.0 - 5.0      4.0 - 5.0           6 - 12 years       4.1 - 5.0      4.0 - 5.0          13 - 30 years       4.1 - 5.2      3.9 - 5.0          31 - 50 years       4.0 - 5.0      3.8 - 4.8          51 - 60 years       3.8 - 4.9      3.8 - 4.9          61 - 70 years       3.8 - 4.8      3.8 - 4.8          71 - 80 years       3.7 - 4.7      3.7 - 4.7          81 - 89 years       3.6 - 4.6      3.6 - 4.6              >89 years       3.5 - 4.6      3.5 - 4.6    Globulin, Total 2.4 1.5 - 4.5 g/dL   Albumin/Globulin Ratio 1.7 1.2 - 2.2   Bilirubin Total 0.4 0.0 - 1.2 mg/dL   Alkaline Phosphatase 90 39 - 117 IU/L   AST 26 0 - 40 IU/L   ALT 31 0 - 44 IU/L  Lipid  panel     Status: Abnormal   Collection Time: 07/30/18 10:06 AM  Result Value Ref Range   Cholesterol, Total 144 100 - 199 mg/dL   Triglycerides 231 (H) 0 - 149 mg/dL   HDL 37 (L) >39 mg/dL   VLDL Cholesterol Cal 46 (H) 5 - 40 mg/dL   LDL Calculated 61 0 - 99 mg/dL   Chol/HDL Ratio 3.9 0.0 - 5.0 ratio    Comment:                                   T. Chol/HDL Ratio                                             Men  Women                               1/2 Avg.Risk  3.4    3.3                                   Avg.Risk  5.0    4.4                                2X Avg.Risk  9.6    7.1                                3X Avg.Risk 23.4   11.0   TSH     Status: None   Collection Time: 07/30/18 10:06 AM  Result Value Ref Range   TSH 2.880 0.450 - 4.500 uIU/mL  Hgb A1c w/o eAG     Status: Abnormal   Collection Time: 07/30/18 10:06 AM  Result Value Ref Range   Hgb A1c MFr Bld 5.8 (H) 4.8 - 5.6 %    Comment:           Prediabetes: 5.7 - 6.4          Diabetes: >6.4          Glycemic control for adults with diabetes: <7.0   T4, free     Status: None   Collection Time: 07/30/18 10:06 AM  Result Value Ref Range   Free T4 0.88 0.82 - 1.77 ng/dL  Testosterone     Status: Abnormal   Collection Time: 07/30/18 10:06 AM  Result Value Ref Range   Testosterone 158 (L) 264 - 916 ng/dL    Comment: Adult male reference interval is based on a population of healthy nonobese males (BMI <30) between 52 and 65 years old. [place], [place] [phone]. PMID: [id number].     Radiology: No results found.  No results found.  No results found.    Assessment and Plan: Patient Active Problem List   Diagnosis Date Noted  . Screening for prostate cancer 07/30/2018  . Dysuria 07/30/2018  . Arthritis of carpometacarpal [facility]) joint of right thumb 07/02/2018  . Primary generalized (osteo)arthritis 06/10/2018  . Cervical disc disease with myelopathy 06/10/2018  . Anxiety 06/08/2018  . Depression 06/08/2018  . BPH (benign prostatic hyperplasia) 06/08/2018  .  Neuropathy 06/08/2018  . Morbid obesity with BMI of 45.0-49.9, adult ([place]) 11/30/2017  . Tick bite 11/27/2017  . Plantar fasciitis 11/27/2017  . Primary osteoarthritis of right elbow 11/27/2017  . OSA on CPAP 08/24/2017  . Mixed hyperlipidemia 07/28/2017  . Allergic rhinitis due to pollen 07/28/2017  . Hypoxemia 07/28/2017  . Calculus of kidney 07/28/2017  . Chest pain, unspecified 07/28/2017  . Major depressive disorder, recurrent, moderate ([place]) 07/28/2017  . 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 Pain in left ankle and joints of left foot 07/28/2017  . Essential (primary) hypertension 07/28/2017  . Elevated hemoglobin A1c 12/02/2015  . Urge incontinence 11/25/2015  . Urinary frequency 10/21/2015  . OAB (overactive bladder) 09/23/2015  . Frequency 09/18/2015  . Gallstones 03/05/2013    1. OSA needs new machine current machine not working.  He had a HST done in May last year which showed that his apnea hypopnea index was greater than 26 he should qualify for a new machine without any difficulty 2. Morbid Obesity discussed with the patient the use of exercise and diet as primary methods to try to help himself lose weight.  If he does indeed need surgery would be much better if he is 50 pounds lighter as is recommended by his surgeon.  In addition this would also help with his sleep disorder and his breathing issues 3. Cervical Stenosis followed by neurosurgery 4. Neuropathy related to above  General Counseling: I have discussed the findings of the evaluation and examination with Latravis.  I have also discussed any further diagnostic evaluation thatmay be needed or ordered today. Armany verbalizes understanding of the findings of todays visit. We also reviewed his medications today and discussed drug interactions and side effects including but not limited excessive drowsiness and altered mental states. We also discussed that there is always a risk not just to him but also people around him. he has been encouraged to call the office with any questions or concerns that should arise related to todays visit.    Time spent: 15 minutes  I have personally obtained a history, examined the patient, evaluated laboratory and imaging results, formulated the assessment and plan and placed orders.    Allyne Gee, MD [facility] Pulmonary and Critical Care Sleep medicine

## 2018-08-06 NOTE — Patient Instructions (Signed)
CPAP and BPAP Information  CPAP and BPAP are methods of helping a person breathe with the use of air pressure. CPAP stands for "continuous positive airway pressure." BPAP stands for "bi-level positive airway pressure." In both methods, air is blown through your nose or mouth and into your air passages to help you breathe well.  CPAP and BPAP use different amounts of pressure to blow air. With CPAP, the amount of pressure stays the same while you breathe in and out. With BPAP, the amount of pressure is increased when you breathe in (inhale) so that you can take larger breaths. Your health care provider will recommend whether CPAP or BPAP would be more helpful for you.  Why are CPAP and BPAP treatments used?  CPAP or BPAP can be helpful if you have:  · Sleep apnea.  · Chronic obstructive pulmonary disease (COPD).  · Heart failure.  · Medical conditions that weaken the muscles of the chest including muscular dystrophy, or neurological diseases such as amyotrophic lateral sclerosis (ALS).  · Other problems that cause breathing to be weak, abnormal, or difficult.  CPAP is most commonly used for obstructive sleep apnea (OSA) to keep the airways from collapsing when the muscles relax during sleep.  How is CPAP or BPAP administered?  Both CPAP and BPAP are provided by a small machine with a flexible plastic tube that attaches to a plastic mask. You wear the mask. Air is blown through the mask into your nose or mouth. The amount of pressure that is used to blow the air can be adjusted on the machine. Your health care provider will determine the pressure setting that should be used based on your individual needs.  When should CPAP or BPAP be used?  In most cases, the mask only needs to be worn during sleep. Generally, the mask needs to be worn throughout the night and during any daytime naps. People with certain medical conditions may also need to wear the mask at other times when they are awake. Follow instructions from your  health care provider about when to use the machine.  What are some tips for using the mask?    · Because the mask needs to be snug, some people feel trapped or closed-in (claustrophobic) when first using the mask. If you feel this way, you may need to get used to the mask. One way to do this is by holding the mask loosely over your nose or mouth and then gradually applying the mask more snugly. You can also gradually increase the amount of time that you use the mask.  · Masks are available in various types and sizes. Some fit over your mouth and nose while others fit over just your nose. If your mask does not fit well, talk with your health care provider about getting a different one.  · If you are using a mask that fits over your nose and you tend to breathe through your mouth, a chin strap may be applied to help keep your mouth closed.  · The CPAP and BPAP machines have alarms that may sound if the mask comes off or develops a leak.  · If you have trouble with the mask, it is very important that you talk with your health care provider about finding a way to make the mask easier to tolerate. Do not stop using the mask. Stopping the use of the mask could have a negative impact on your health.  What are some tips for using the machine?  ·   Place your CPAP or BPAP machine on a secure table or stand near an electrical outlet.  · Know where the on/off switch is located on the machine.  · Follow instructions from your health care provider about how to set the pressure on your machine and when you should use it.  · Do not eat or drink while the CPAP or BPAP machine is on. Food or fluids could get pushed into your lungs by the pressure of the CPAP or BPAP.  · Do not smoke. Tobacco smoke residue can damage the machine.  · For home use, CPAP and BPAP machines can be rented or purchased through home health care companies. Many different brands of machines are available. Renting a machine before purchasing may help you find out  which particular machine works well for you.  · Keep the CPAP or BPAP machine and attachments clean. Ask your health care provider for specific instructions.  Get help right away if:  · You have redness or open areas around your nose or mouth where the mask fits.  · You have trouble using the CPAP or BPAP machine.  · You cannot tolerate wearing the CPAP or BPAP mask.  · You have pain, discomfort, and bloating in your abdomen.  Summary  · CPAP and BPAP are methods of helping a person breathe with the use of air pressure.  · Both CPAP and BPAP are provided by a small machine with a flexible plastic tube that attaches to a plastic mask.  · If you have trouble with the mask, it is very important that you talk with your health care provider about finding a way to make the mask easier to tolerate.  This information is not intended to replace advice given to you by your health care provider. Make sure you discuss any questions you have with your health care provider.  Document Released: 04/08/2004 Document Revised: 03/13/2018 Document Reviewed: 05/30/2016  Elsevier Interactive Patient Education © 2019 [employer]

## 2018-08-09 DIAGNOSIS — M6281 Muscle weakness (generalized): Secondary | ICD-10-CM | POA: Diagnosis not present

## 2018-08-09 DIAGNOSIS — M5412 Radiculopathy, cervical region: Secondary | ICD-10-CM | POA: Diagnosis not present

## 2018-08-22 ENCOUNTER — Ambulatory Visit (INDEPENDENT_AMBULATORY_CARE_PROVIDER_SITE_OTHER): Payer: Medicare HMO

## 2018-08-22 DIAGNOSIS — Z9989 Dependence on other enabling machines and devices: Secondary | ICD-10-CM | POA: Diagnosis not present

## 2018-08-22 DIAGNOSIS — G4733 Obstructive sleep apnea (adult) (pediatric): Secondary | ICD-10-CM | POA: Diagnosis not present

## 2018-08-22 NOTE — Progress Notes (Signed)
New CPAP setup he was setup Resmed S-10 at 5-15 cwp with resmed n-20 nasal mask large.He had good understanding of compliance,cleaning and using Cpap

## 2018-08-28 DIAGNOSIS — M5020 Other cervical disc displacement, unspecified cervical region: Secondary | ICD-10-CM | POA: Diagnosis not present

## 2018-08-28 DIAGNOSIS — M5412 Radiculopathy, cervical region: Secondary | ICD-10-CM | POA: Diagnosis not present

## 2018-09-10 DIAGNOSIS — M5412 Radiculopathy, cervical region: Secondary | ICD-10-CM | POA: Diagnosis not present

## 2018-09-10 DIAGNOSIS — M6281 Muscle weakness (generalized): Secondary | ICD-10-CM | POA: Diagnosis not present

## 2018-09-12 DIAGNOSIS — M5412 Radiculopathy, cervical region: Secondary | ICD-10-CM | POA: Diagnosis not present

## 2018-09-17 DIAGNOSIS — M6281 Muscle weakness (generalized): Secondary | ICD-10-CM | POA: Diagnosis not present

## 2018-09-17 DIAGNOSIS — M5412 Radiculopathy, cervical region: Secondary | ICD-10-CM | POA: Diagnosis not present

## 2018-09-19 ENCOUNTER — Ambulatory Visit (INDEPENDENT_AMBULATORY_CARE_PROVIDER_SITE_OTHER): Payer: Medicare HMO

## 2018-09-19 DIAGNOSIS — G4733 Obstructive sleep apnea (adult) (pediatric): Secondary | ICD-10-CM

## 2018-09-19 DIAGNOSIS — Z9989 Dependence on other enabling machines and devices: Secondary | ICD-10-CM

## 2018-09-19 DIAGNOSIS — M5412 Radiculopathy, cervical region: Secondary | ICD-10-CM | POA: Diagnosis not present

## 2018-09-19 NOTE — Progress Notes (Signed)
95 percentile pressure 12   95th percentile leak 22.9   apnea index 0.6 /hr  apnea-hypopnea index  1.7 /hr   total days used  >4 hr 20 days  total days used <4 hr 20 days  Total compliance 100 percent  Jimmy Norton is doing great on new cpap likes new machine.

## 2018-09-22 DIAGNOSIS — G4733 Obstructive sleep apnea (adult) (pediatric): Secondary | ICD-10-CM | POA: Diagnosis not present

## 2018-09-26 DIAGNOSIS — M4712 Other spondylosis with myelopathy, cervical region: Secondary | ICD-10-CM | POA: Diagnosis not present

## 2018-09-27 ENCOUNTER — Encounter: Payer: Self-pay | Admitting: Internal Medicine

## 2018-09-27 ENCOUNTER — Ambulatory Visit: Payer: Medicare HMO | Admitting: Internal Medicine

## 2018-09-27 VITALS — BP 124/76 | HR 74 | Resp 16 | Ht 69.0 in | Wt 324.0 lb

## 2018-09-27 DIAGNOSIS — G4733 Obstructive sleep apnea (adult) (pediatric): Secondary | ICD-10-CM | POA: Diagnosis not present

## 2018-09-27 DIAGNOSIS — Z6841 Body Mass Index (BMI) 40.0 and over, adult: Secondary | ICD-10-CM | POA: Diagnosis not present

## 2018-09-27 DIAGNOSIS — M4802 Spinal stenosis, cervical region: Secondary | ICD-10-CM

## 2018-09-27 DIAGNOSIS — I10 Essential (primary) hypertension: Secondary | ICD-10-CM | POA: Diagnosis not present

## 2018-09-27 DIAGNOSIS — Z9989 Dependence on other enabling machines and devices: Secondary | ICD-10-CM

## 2018-09-27 NOTE — Patient Instructions (Signed)

## 2018-09-27 NOTE — Progress Notes (Signed)
[facility] [place], [place] [address]  Pulmonary Sleep Medicine   Office Visit Note  Patient Name: Jimmy Norton. DOB: 07-Jun-1946 MRN [id number]  Date of Service: 09/27/2018  Complaints/HPI: Pt is here for face to face follow up on OSA.  He is using CPAP.  He has 100 percent compliance with his machine.  He has 20 days greater than 4 hours.  His AHI is 1.7 hr.  He reports good relief of symptoms when wearing machine. He denies any headaches, chest pain, or sinus issues. He is cleaning his cpap as directed. He is also changing his tubing and mask seals as directed.       ROS  General: (-) fever, (-) chills, (-) night sweats, (-) weakness Skin: (-) rashes, (-) itching,. Eyes: (-) visual changes, (-) redness, (-) itching. Nose and Sinuses: (-) nasal stuffiness or itchiness, (-) postnasal drip, (-) nosebleeds, (-) sinus trouble. Mouth and Throat: (-) sore throat, (-) hoarseness. Neck: (-) swollen glands, (-) enlarged thyroid, (-) neck pain. Respiratory: - cough, (-) bloody sputum, - shortness of breath, - wheezing. Cardiovascular: - ankle swelling, (-) chest pain. Lymphatic: (-) lymph node enlargement. Neurologic: (-) numbness, (-) tingling. Psychiatric: (-) anxiety, (-) depression   Current Medication: Outpatient Encounter Medications as of 09/27/2018  Medication Sig  . cholecalciferol (VITAMIN D) 400 UNITS TABS tablet Take 400 Units by mouth daily.  . Cyanocobalamin (B-12) 2500 MCG TABS Take 1 tablet by mouth daily.  Marland Kitchen FLUoxetine (PROZAC) 10 MG capsule Take 1 capsule (10 mg total) by mouth daily.  Marland Kitchen glucosamine-chondroitin 500-400 MG tablet Take 1 tablet by mouth 2 (two) times daily.  Marland Kitchen losartan (COZAAR) 25 MG tablet Take 1 tablet (25 mg total) by mouth daily.  . meloxicam (MOBIC) 15 MG tablet Take 1 tablet (15 mg total) by mouth daily.  . Misc Natural Products (OSTEO BI-FLEX/5-LOXIN ADVANCED) TABS Take by mouth daily.  . Multiple Vitamins-Minerals  (MULTIVITAMIN WITH MINERALS) tablet Take 1 tablet by mouth daily. Reported on 09/11/2015  . Probiotic Product (PROBIOTIC-10 PO) Take by mouth.  . pyridoxine (B-6) 100 MG tablet Take 100 mg by mouth daily.  . Pyridoxine HCl (B-6) 100 MG TABS Take by mouth.  . terazosin (HYTRIN) 10 MG capsule Take 1 capsule (10 mg total) by mouth at bedtime.  . Testosterone 12.5 MG/ACT (1%) GEL Use I pump transdermal, under each arm QD  . tiZANidine (ZANAFLEX) 4 MG tablet Take by mouth.  . triamcinolone (KENALOG) 0.025 % ointment Apply 1 application topically 2 (two) times daily.   No facility-administered encounter medications on file as of 09/27/2018.     Surgical History: Past Surgical History:  Procedure Laterality Date  . HAND SURGERY Bilateral   . HERNIA REPAIR     x 2  . NOSE SURGERY    . REPLACEMENT TOTAL KNEE      Medical History: Past Medical History:  Diagnosis Date  . Anxiety and depression   . Frequency   . Gallstones 03/05/2013  . Hypertension   . Kidney problem   May  2013  . Mixed hyperlipidemia   . OAB (overactive bladder)   . Obstructive sleep apnea (adult) (pediatric)     Family History: Family History  Problem Relation Age of Onset  . COPD Mother   . Heart disease Mother   . Cancer Father        Brain tumor  . Lung cancer Paternal Uncle   . Cancer - Other Maternal Grandmother  Back  . Kidney disease Neg Hx   . Prostate cancer Neg Hx     Social History: Social History   Socioeconomic History  . Marital status: Married    Spouse name: Not on file  . Number of children: Not on file  . Years of education: Not on file  . Highest education level: Not on file  Occupational History  . Not on file  Social Needs  . Financial resource strain: Not on file  . Food insecurity:    Worry: Not on file    Inability: Not on file  . Transportation needs:    Medical: Not on file    Non-medical: Not on file  Tobacco Use  . Smoking status: Former Smoker    Types:  Cigarettes  . Smokeless tobacco: Current User    Types: Chew  Substance and Sexual Activity  . Alcohol use: No    Alcohol/week: 0.0 standard drinks  . Drug use: No  . Sexual activity: Not on file  Lifestyle  . Physical activity:    Days per week: Not on file    Minutes per session: Not on file  . Stress: Not on file  Relationships  . Social connections:    Talks on phone: Not on file    Gets together: Not on file    Attends religious service: Not on file    Active member of club or organization: Not on file    Attends meetings of clubs or organizations: Not on file    Relationship status: Not on file  . Intimate partner violence:    Fear of current or ex partner: Not on file    Emotionally abused: Not on file    Physically abused: Not on file    Forced sexual activity: Not on file  Other Topics Concern  . Not on file  Social History Narrative  . Not on file    Vital Signs: Blood pressure 124/76, pulse 74, resp. rate 16, height 5\' 9"  (1.753 m), weight (!) 324 lb (147 kg), SpO2 97 %.  Examination: General Appearance: The patient is well-developed, well-nourished, and in no distress. Skin: Gross inspection of skin unremarkable. Head: normocephalic, no gross deformities. Eyes: no gross deformities noted. ENT: ears appear grossly normal no exudates. Neck: Supple. No thyromegaly. No LAD. Respiratory: clear bilateraly. Cardiovascular: Normal S1 and S2 without murmur or rub. Extremities: No cyanosis. pulses are equal. Neurologic: Alert and oriented. No involuntary movements.  LABS: Recent Results (from the past 2160 hour(s))  UA/M w/rflx Culture, Routine     Status: None   Collection Time: 07/30/18  8:49 AM  Result Value Ref Range   Specific Gravity, UA 1.019 1.005 - 1.030   pH, UA 5.0 5.0 - 7.5   Color, UA Yellow Yellow   Appearance Ur Clear Clear   Leukocytes, UA Negative Negative   Protein, UA Negative Negative/Trace   Glucose, UA Negative Negative   Ketones, UA  Negative Negative   RBC, UA Negative Negative   Bilirubin, UA Negative Negative   Urobilinogen, Ur 0.2 0.2 - 1.0 mg/dL   Nitrite, UA Negative Negative   Microscopic Examination Comment     Comment: Microscopic follows if indicated.   Microscopic Examination See below:     Comment: Microscopic was indicated and was performed.   Urinalysis Reflex Comment     Comment: This specimen will not reflex to a Urine Culture.  Microscopic Examination     Status: Abnormal   Collection Time: 07/30/18  8:49 AM  Result Value Ref Range   WBC, UA 0-5 0 - 5 /hpf   RBC, UA 0-2 0 - 2 /hpf   Epithelial Cells (non renal) 0-10 0 - 10 /hpf   Casts Present (A) None seen /lpf   Cast Type Hyaline casts N/A   Mucus, UA Present Not Estab.   Bacteria, UA None seen None seen/Few  PSA     Status: None   Collection Time: 07/30/18 10:06 AM  Result Value Ref Range   Prostate Specific Ag, Serum 0.4 0.0 - 4.0 ng/mL    Comment: Roche ECLIA methodology. According to the American Urological Association, Serum PSA should decrease and remain at undetectable levels after radical prostatectomy. The AUA defines biochemical recurrence as an initial PSA value 0.2 ng/mL or greater followed by a subsequent confirmatory PSA value 0.2 ng/mL or greater. Values obtained with different assay methods or kits cannot be used interchangeably. Results cannot be interpreted as absolute evidence of the presence or absence of malignant disease.   CBC with Differential/Platelet     Status: None   Collection Time: 07/30/18 10:06 AM  Result Value Ref Range   WBC 6.4 3.4 - 10.8 x10E3/uL   RBC 5.34 4.14 - 5.80 x10E6/uL   Hemoglobin 15.4 13.0 - 17.7 g/dL   Hematocrit 45.4 37.5 - 51.0 %   MCV 85 79 - 97 fL   MCH 28.8 26.6 - 33.0 pg   MCHC 33.9 31.5 - 35.7 g/dL   RDW 13.1 11.6 - 15.4 %    Comment:               **Please note reference interval change**   Platelets 220 150 - 450 x10E3/uL   Neutrophils 59 Not Estab. %   Lymphs 26 Not  Estab. %   Monocytes 12 Not Estab. %   Eos 2 Not Estab. %   Basos 1 Not Estab. %   Neutrophils Absolute 3.8 1.4 - 7.0 x10E3/uL   Lymphocytes Absolute 1.7 0.7 - 3.1 x10E3/uL   Monocytes Absolute 0.7 0.1 - 0.9 x10E3/uL   EOS (ABSOLUTE) 0.2 0.0 - 0.4 x10E3/uL   Basophils Absolute 0.0 0.0 - 0.2 x10E3/uL   Immature Granulocytes 0 Not Estab. %   Immature Grans (Abs) 0.0 0.0 - 0.1 x10E3/uL  Comprehensive metabolic panel     Status: Abnormal   Collection Time: 07/30/18 10:06 AM  Result Value Ref Range   Glucose 101 (H) 65 - 99 mg/dL   BUN 14 8 - 27 mg/dL   Creatinine, Ser 0.93 0.76 - 1.27 mg/dL   GFR calc non Af Amer 82 >59 mL/min/1.73   GFR calc Af Amer 94 >59 mL/min/1.73   BUN/Creatinine Ratio 15 10 - 24   Sodium 141 134 - 144 mmol/L   Potassium 4.4 3.5 - 5.2 mmol/L   Chloride 104 96 - 106 mmol/L   CO2 22 20 - 29 mmol/L   Calcium 9.5 8.6 - 10.2 mg/dL   Total Protein 6.5 6.0 - 8.5 g/dL   Albumin 4.1 3.5 - 4.8 g/dL    Comment:     **Effective August 13, 2018 Albumin reference**       interval will be changing to:              Age                Male          Male           0 -  7 days        3.6 - 4.9      3.6 - 4.9           8 - 30 days        3.4 - 4.7      3.4 - 4.7           1 -  6 month       3.7 - 4.8      3.7 - 4.8    7 months -  2 years       3.9 - 5.0      3.9 - 5.0           3 -  5 years       4.0 - 5.0      4.0 - 5.0           6 - 12 years       4.1 - 5.0      4.0 - 5.0          13 - 30 years       4.1 - 5.2      3.9 - 5.0          31 - 50 years       4.0 - 5.0      3.8 - 4.8          51 - 60 years       3.8 - 4.9      3.8 - 4.9          61 - 70 years       3.8 - 4.8      3.8 - 4.8          71 - 80 years       3.7 - 4.7      3.7 - 4.7          81 - 89 years       3.6 - 4.6      3.6 - 4.6              >89 years       3.5 - 4.6      3.5 - 4.6    Globulin, Total 2.4 1.5 - 4.5 g/dL   Albumin/Globulin Ratio 1.7 1.2 - 2.2   Bilirubin Total 0.4 0.0 - 1.2 mg/dL   Alkaline  Phosphatase 90 39 - 117 IU/L   AST 26 0 - 40 IU/L   ALT 31 0 - 44 IU/L  Lipid panel     Status: Abnormal   Collection Time: 07/30/18 10:06 AM  Result Value Ref Range   Cholesterol, Total 144 100 - 199 mg/dL   Triglycerides 231 (H) 0 - 149 mg/dL   HDL 37 (L) >39 mg/dL   VLDL Cholesterol Cal 46 (H) 5 - 40 mg/dL   LDL Calculated 61 0 - 99 mg/dL   Chol/HDL Ratio 3.9 0.0 - 5.0 ratio    Comment:                                   T. Chol/HDL Ratio                                             Men  Women                               1/2 Avg.Risk  3.4    3.3                                   Avg.Risk  5.0    4.4                                2X Avg.Risk  9.6    7.1                                3X Avg.Risk 23.4   11.0   TSH     Status: None   Collection Time: 07/30/18 10:06 AM  Result Value Ref Range   TSH 2.880 0.450 - 4.500 uIU/mL  Hgb A1c w/o eAG     Status: Abnormal   Collection Time: 07/30/18 10:06 AM  Result Value Ref Range   Hgb A1c MFr Bld 5.8 (H) 4.8 - 5.6 %    Comment:          Prediabetes: 5.7 - 6.4          Diabetes: >6.4          Glycemic control for adults with diabetes: <7.0   T4, free     Status: None   Collection Time: 07/30/18 10:06 AM  Result Value Ref Range   Free T4 0.88 0.82 - 1.77 ng/dL  Testosterone     Status: Abnormal   Collection Time: 07/30/18 10:06 AM  Result Value Ref Range   Testosterone 158 (L) 264 - 916 ng/dL    Comment: Adult male reference interval is based on a population of healthy nonobese males (BMI <30) between 70 and 81 years old. [place], [place] [phone]. PMID: [id number].     Radiology: No results found.  No results found.  No results found.    Assessment and Plan: Patient Active Problem List   Diagnosis Date Noted  . Screening for prostate cancer 07/30/2018  . Dysuria 07/30/2018  . Arthritis of carpometacarpal [facility]) joint of right thumb 07/02/2018  . Primary generalized (osteo)arthritis 06/10/2018  .  Cervical disc disease with myelopathy 06/10/2018  . Anxiety 06/08/2018  . Depression 06/08/2018  . BPH (benign prostatic hyperplasia) 06/08/2018  . Neuropathy 06/08/2018  . Morbid obesity with BMI of 45.0-49.9, adult ([place]) 11/30/2017  . Tick bite 11/27/2017  . Plantar fasciitis 11/27/2017  . Primary osteoarthritis of right elbow 11/27/2017  . OSA on CPAP 08/24/2017  . Mixed hyperlipidemia 07/28/2017  . Allergic rhinitis due to pollen 07/28/2017  . Hypoxemia 07/28/2017  . Calculus of kidney 07/28/2017  . Chest pain, unspecified 07/28/2017  . Major depressive disorder, recurrent, moderate ([place]) 07/28/2017  . 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 Pain in left ankle and joints of left foot 07/28/2017  . Essential (primary) hypertension 07/28/2017  . Elevated hemoglobin A1c 12/02/2015  . Urge incontinence 11/25/2015  . Urinary frequency 10/21/2015  . OAB (overactive bladder) 09/23/2015  . Frequency 09/18/2015  . Gallstones 03/05/2013    1. OSA on CPAP Pt will continue to use cpap as directed.    2. Essential (primary) hypertension Stable continue current medication regimen.   3. Cervical stenosis  of spinal canal Follow by Neurosurgery.   4. Morbid obesity with BMI of 45.0-49.9, adult (HCC) Obesity Counseling: Risk Assessment: An assessment of behavioral risk factors was made today and includes lack of exercise sedentary lifestyle, lack of portion control and poor dietary habits.  Risk Modification Advice: She was counseled on portion control guidelines. Restricting daily caloric intake to. . The detrimental long term effects of obesity on her health and ongoing poor compliance was also discussed with the patient.    General Counseling: I have discussed the findings of the evaluation and examination with [place].  I have also discussed any further diagnostic evaluation thatmay be needed or ordered today. Jailin verbalizes understanding of the findings of todays visit. We also  reviewed his medications today and discussed drug interactions and side effects including but not limited excessive drowsiness and altered mental states. We also discussed that there is always a risk not just to him but also people around him. he has been encouraged to call the office with any questions or concerns that should arise related to todays visit.    Time spent: 25 This patient was seen by Orson Gear AGNP-C in Collaboration with Dr. Devona Konig as a part of collaborative care agreement.   I have personally obtained a history, examined the patient, evaluated laboratory and imaging results, formulated the assessment and plan and placed orders.    Allyne Gee, MD [facility] Pulmonary and Critical Care Sleep medicine

## 2018-10-01 DIAGNOSIS — M5412 Radiculopathy, cervical region: Secondary | ICD-10-CM | POA: Diagnosis not present

## 2018-10-01 DIAGNOSIS — M6281 Muscle weakness (generalized): Secondary | ICD-10-CM | POA: Diagnosis not present

## 2018-10-03 DIAGNOSIS — M5412 Radiculopathy, cervical region: Secondary | ICD-10-CM | POA: Diagnosis not present

## 2018-10-09 DIAGNOSIS — J208 Acute bronchitis due to other specified organisms: Secondary | ICD-10-CM | POA: Diagnosis not present

## 2018-10-21 DIAGNOSIS — G4733 Obstructive sleep apnea (adult) (pediatric): Secondary | ICD-10-CM | POA: Diagnosis not present

## 2018-11-05 ENCOUNTER — Other Ambulatory Visit: Payer: Self-pay

## 2018-11-05 ENCOUNTER — Ambulatory Visit (INDEPENDENT_AMBULATORY_CARE_PROVIDER_SITE_OTHER): Payer: Medicare HMO | Admitting: Nurse Practitioner

## 2018-11-05 ENCOUNTER — Encounter: Payer: Self-pay | Admitting: Nurse Practitioner

## 2018-11-05 VITALS — BP 124/76 | HR 66 | Resp 16 | Ht 69.0 in | Wt 315.0 lb

## 2018-11-05 DIAGNOSIS — M4802 Spinal stenosis, cervical region: Secondary | ICD-10-CM

## 2018-11-05 DIAGNOSIS — R69 Illness, unspecified: Secondary | ICD-10-CM | POA: Diagnosis not present

## 2018-11-05 DIAGNOSIS — G4733 Obstructive sleep apnea (adult) (pediatric): Secondary | ICD-10-CM

## 2018-11-05 DIAGNOSIS — I10 Essential (primary) hypertension: Secondary | ICD-10-CM

## 2018-11-05 DIAGNOSIS — F329 Major depressive disorder, single episode, unspecified: Secondary | ICD-10-CM

## 2018-11-05 DIAGNOSIS — Z9989 Dependence on other enabling machines and devices: Secondary | ICD-10-CM

## 2018-11-05 DIAGNOSIS — E291 Testicular hypofunction: Secondary | ICD-10-CM

## 2018-11-05 DIAGNOSIS — F32A Depression, unspecified: Secondary | ICD-10-CM

## 2018-11-05 NOTE — Progress Notes (Signed)
[facility] [place], [place] [address]  Internal MEDICINE  Office Visit Note  Patient Name: Jimmy Norton  [id number]  [id number]  Date of Service: 11/06/2018  Chief Complaint  Patient presents with  . Hyperlipidemia    3 month follow up   . Hypertension    The patient is here for health maintenance exam. She has pain and numbness in both arms. Has seen orthopedic provider who ordered MRI of the cervical spine. 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 He is due to see neurosurgeon 08/02/2018. Weakness in his arms has become so severe, he cannot grasp a coffee cup or bowl because grip strength is not there.  He is now attending physical therapy and this is really helping. Posture and ROM in neck and arms have both improved.  He is due to have routine, fasting labs done. Blood pressure is well controlled. Patient states that he is trying to do well with taking his medications every day. Was recently treated with antibiotics and prednisone for bronchitis. Was seen at Urgent care. He states that hs has completed treatment and is doing much better.       Current Medication: Outpatient Encounter Medications as of 11/05/2018  Medication Sig  . cholecalciferol (VITAMIN D) 400 UNITS TABS tablet Take 400 Units by mouth daily.  . Cyanocobalamin (B-12) 2500 MCG TABS Take 1 tablet by mouth daily.  Marland Kitchen FLUoxetine (PROZAC) 10 MG capsule Take 1 capsule (10 mg total) by mouth daily.  Marland Kitchen glucosamine-chondroitin 500-400 MG tablet Take 1 tablet by mouth 2 (two) times daily.  Marland Kitchen losartan (COZAAR) 25 MG tablet Take 1 tablet (25 mg total) by mouth daily.  . meloxicam (MOBIC) 15 MG tablet Take 1 tablet (15 mg total) by mouth daily.  . Misc Natural Products (OSTEO BI-FLEX/5-LOXIN ADVANCED) TABS Take by mouth daily.  . Multiple Vitamins-Minerals (MULTIVITAMIN WITH MINERALS) tablet Take 1 tablet by mouth daily. Reported on 09/11/2015  . Probiotic  Product (PROBIOTIC-10 PO) Take by mouth.  . Pyridoxine HCl (B-6) 100 MG TABS Take by mouth.  . terazosin (HYTRIN) 10 MG capsule Take 1 capsule (10 mg total) by mouth at bedtime.  . Testosterone 12.5 MG/ACT (1%) GEL Use I pump transdermal, under each arm QD  . tiZANidine (ZANAFLEX) 4 MG tablet Take by mouth.  . triamcinolone (KENALOG) 0.025 % ointment Apply 1 application topically 2 (two) times daily.  . [DISCONTINUED] pyridoxine (B-6) 100 MG tablet Take 100 mg by mouth daily.   No facility-administered encounter medications on file as of 11/05/2018.     Surgical History: Past Surgical History:  Procedure Laterality Date  . HAND SURGERY Bilateral   . HERNIA REPAIR     x 2  . NOSE SURGERY    . REPLACEMENT TOTAL KNEE      Medical History: Past Medical History:  Diagnosis Date  . Anxiety and depression   . Frequency   . Gallstones 03/05/2013  . Hypertension   . Kidney problem   May  2013  . Mixed hyperlipidemia   . OAB (overactive bladder)   . Obstructive sleep apnea (adult) (pediatric)     Family History: Family History  Problem Relation Age of Onset  . COPD Mother   . Heart disease Mother   . Cancer Father        Brain tumor  . Lung cancer Paternal Uncle   . Cancer - Other Maternal Grandmother  Back  . Kidney disease Neg Hx   . Prostate cancer Neg Hx     Social History   Socioeconomic History  . Marital status: Married    Spouse name: Not on file  . Number of children: Not on file  . Years of education: Not on file  . Highest education level: Not on file  Occupational History  . Not on file  Social Needs  . Financial resource strain: Not on file  . Food insecurity:    Worry: Not on file    Inability: Not on file  . Transportation needs:    Medical: Not on file    Non-medical: Not on file  Tobacco Use  . Smoking status: Former Smoker    Types: Cigarettes  . Smokeless tobacco: Current User    Types: Chew  Substance and Sexual Activity  . Alcohol  use: No    Alcohol/week: 0.0 standard drinks  . Drug use: No  . Sexual activity: Not on file  Lifestyle  . Physical activity:    Days per week: Not on file    Minutes per session: Not on file  . Stress: Not on file  Relationships  . Social connections:    Talks on phone: Not on file    Gets together: Not on file    Attends religious service: Not on file    Active member of club or organization: Not on file    Attends meetings of clubs or organizations: Not on file    Relationship status: Not on file  . Intimate partner violence:    Fear of current or ex partner: Not on file    Emotionally abused: Not on file    Physically abused: Not on file    Forced sexual activity: Not on file  Other Topics Concern  . Not on file  Social History Narrative  . Not on file      Review of Systems  Constitutional: Positive for fatigue. Negative for activity change and unexpected weight change.  HENT: Negative for rhinorrhea, sore throat and voice change.   Respiratory: Negative for chest tightness, shortness of breath and wheezing.   Cardiovascular: Negative for chest pain and palpitations.  Endocrine: Negative for cold intolerance, heat intolerance, polydipsia, polyphagia and polyuria.  Genitourinary: Positive for frequency and urgency.       Nocturia is improved now that he is taking the medication in the evenings.   Musculoskeletal: Positive for arthralgias, back pain, joint swelling, myalgias and neck pain.       Patient seeing orthopedics and physical therapy at this time. Pain and swelling gradually improving.   Skin: Negative for rash.  Allergic/Immunologic: Negative for environmental allergies.  Neurological: Positive for headaches. Negative for dizziness.       History of migraines. Has had a few off and on, but nothing out of the ordinary.  Hematological: Negative for adenopathy.  Psychiatric/Behavioral: Positive for dysphoric mood. Negative for suicidal ideas.       Mild,  chronic depression. Improved since he started taking sertraline again.     Today's Vitals   11/05/18 1038  BP: 124/76  Pulse: 66  Resp: 16  SpO2: 94%  Weight: (!) 315 lb (142.9 kg)  Height: 5\' 9"  (1.753 m)   Body mass index is 46.52 kg/m.  Physical Exam Vitals signs and nursing note reviewed.  Constitutional:      General: He is not in acute distress.    Appearance: He is well-developed. He is obese. He  is not diaphoretic.  HENT:     Head: Normocephalic and atraumatic.     Mouth/Throat:     Pharynx: No oropharyngeal exudate.  Eyes:     Pupils: Pupils are equal, round, and reactive to light.  Neck:     Musculoskeletal: Neck supple. Decreased range of motion. Spinous process tenderness and muscular tenderness present.     Thyroid: No thyromegaly.     Vascular: No carotid bruit or JVD.     Trachea: No tracheal deviation.  Cardiovascular:     Rate and Rhythm: Normal rate and regular rhythm.     Heart sounds: Normal heart sounds. No murmur. No friction rub. No gallop.   Pulmonary:     Effort: Pulmonary effort is normal. No respiratory distress.     Breath sounds: Normal breath sounds. No wheezing or rales.  Chest:     Chest wall: No tenderness.  Abdominal:     General: Bowel sounds are normal.     Palpations: Abdomen is soft.  Musculoskeletal:     Comments: Right shoulder tenderness with reduced ROM and strength. Right elbow mildly swollen with reduced ROM and strength. No visible abnormalities or deformities present.  Left ankle and left foot pain with mild to moderate swelling. Pain is worse when walking. He has reduced ROM and strength in left foot.  There is generalized joint pain without point tenderness present.   Lymphadenopathy:     Cervical: No cervical adenopathy.  Skin:    General: Skin is warm and dry.  Neurological:     Mental Status: He is alert and oriented to person, place, and time.     Cranial Nerves: No cranial nerve deficit.  Psychiatric:         Mood and Affect: Mood is depressed.        Speech: Speech normal.        Behavior: Behavior normal.        Thought Content: Thought content normal.        Judgment: Judgment normal.    Assessment/Plan:  1. Essential (primary) hypertension Stable. Continue blood pressure medication as prescribed   2. Testicular hypofunction Doing well with testosterone topical treatment, however, this is very expensive. Will attempt to find less expensive formulation for him.   3. Cervical stenosis of spinal canal Continue regular visits with orthopedics and physical therapy as scheduled and as needed   4. OSA on CPAP Continue to use CPAP as prescribed   5. Depression, unspecified depression type Improved. Continue fluoxetine as prescribed   General Counseling: Whitaker verbalizes understanding of the findings of todays visit and agrees with plan of treatment. I have discussed any further diagnostic evaluation that may be needed or ordered today. We also reviewed his medications today. he has been encouraged to call the office with any questions or concerns that should arise related to todays visit.   Hypertension Counseling:   The following hypertensive lifestyle modification were recommended and discussed:  1. Limiting alcohol intake to less than 1 oz/day of ethanol:(24 oz of beer or 8 oz of wine or 2 oz of 100-proof whiskey). 2. Take baby ASA 81 mg daily. 3. Importance of regular aerobic exercise and losing weight. 4. Reduce dietary saturated fat and cholesterol intake for overall cardiovascular health. 5. Maintaining adequate dietary potassium, calcium, and magnesium intake. 6. Regular monitoring of the blood pressure. 7. Reduce sodium intake to less than 100 mmol/day (less than 2.3 gm of sodium or less than 6 gm of sodium choride)  This patient was seen by Leretha Pol FNP Collaboration with Dr Lavera Guise as a part of collaborative care agreement   Time spent: 25 Minutes      Dr  Lavera Guise Internal medicine

## 2018-11-06 DIAGNOSIS — M4802 Spinal stenosis, cervical region: Secondary | ICD-10-CM | POA: Insufficient documentation

## 2018-11-21 DIAGNOSIS — G4733 Obstructive sleep apnea (adult) (pediatric): Secondary | ICD-10-CM | POA: Diagnosis not present

## 2018-12-21 DIAGNOSIS — G4733 Obstructive sleep apnea (adult) (pediatric): Secondary | ICD-10-CM | POA: Diagnosis not present

## 2019-01-21 DIAGNOSIS — G4733 Obstructive sleep apnea (adult) (pediatric): Secondary | ICD-10-CM | POA: Diagnosis not present

## 2019-02-20 DIAGNOSIS — G4733 Obstructive sleep apnea (adult) (pediatric): Secondary | ICD-10-CM | POA: Diagnosis not present

## 2019-03-07 ENCOUNTER — Encounter: Payer: Self-pay | Admitting: Nurse Practitioner

## 2019-03-07 ENCOUNTER — Ambulatory Visit (INDEPENDENT_AMBULATORY_CARE_PROVIDER_SITE_OTHER): Payer: Medicare HMO | Admitting: Nurse Practitioner

## 2019-03-07 ENCOUNTER — Other Ambulatory Visit: Payer: Self-pay

## 2019-03-07 VITALS — BP 131/78 | HR 67 | Temp 96.9°F | Resp 16 | Ht 69.0 in | Wt 310.0 lb

## 2019-03-07 DIAGNOSIS — M5000 Cervical disc disorder with myelopathy, unspecified cervical region: Secondary | ICD-10-CM

## 2019-03-07 DIAGNOSIS — N400 Enlarged prostate without lower urinary tract symptoms: Secondary | ICD-10-CM | POA: Diagnosis not present

## 2019-03-07 DIAGNOSIS — F329 Major depressive disorder, single episode, unspecified: Secondary | ICD-10-CM

## 2019-03-07 DIAGNOSIS — R69 Illness, unspecified: Secondary | ICD-10-CM | POA: Diagnosis not present

## 2019-03-07 DIAGNOSIS — I10 Essential (primary) hypertension: Secondary | ICD-10-CM

## 2019-03-07 DIAGNOSIS — F32A Depression, unspecified: Secondary | ICD-10-CM

## 2019-03-07 NOTE — Progress Notes (Signed)
[facility] [place], [place] [address]  Internal MEDICINE  Office Visit Note  Patient Name: Jimmy Norton  [id number]  [id number]  Date of Service: 03/07/2019  Chief Complaint  Patient presents with  . Follow-up  . Hypertension  . Hyperlipidemia  . Anxiety  . Depression    The patient is here for routine follow up of hypertension. Blood pressure is well controlled on current medication. He is seeing orthopedics for herniated discs in his neck. Did have some physical therapy. He had to stop due to spread of COVID 19. Will follow up with him in the near future. He is working on weight loss. Has lost five pounds since he was last seen. Has lost 20 pounds since the beginning of the year.       Current Medication: Outpatient Encounter Medications as of 03/07/2019  Medication Sig  . cholecalciferol (VITAMIN D) 400 UNITS TABS tablet Take 400 Units by mouth daily.  . Cyanocobalamin (B-12) 2500 MCG TABS Take 1 tablet by mouth daily.  Marland Kitchen FLUoxetine (PROZAC) 10 MG capsule Take 1 capsule (10 mg total) by mouth daily.  Marland Kitchen glucosamine-chondroitin 500-400 MG tablet Take 1 tablet by mouth 2 (two) times daily.  Marland Kitchen losartan (COZAAR) 25 MG tablet Take 1 tablet (25 mg total) by mouth daily.  . meloxicam (MOBIC) 15 MG tablet Take 1 tablet (15 mg total) by mouth daily.  . Misc Natural Products (OSTEO BI-FLEX/5-LOXIN ADVANCED) TABS Take by mouth daily.  . Multiple Vitamins-Minerals (MULTIVITAMIN WITH MINERALS) tablet Take 1 tablet by mouth daily. Reported on 09/11/2015  . Probiotic Product (PROBIOTIC-10 PO) Take by mouth.  . Pyridoxine HCl (B-6) 100 MG TABS Take by mouth.  . terazosin (HYTRIN) 10 MG capsule Take 1 capsule (10 mg total) by mouth at bedtime.  Marland Kitchen tiZANidine (ZANAFLEX) 4 MG tablet Take by mouth.  . triamcinolone (KENALOG) 0.025 % ointment Apply 1 application topically 2 (two) times daily.  . [DISCONTINUED] Testosterone 12.5 MG/ACT (1%) GEL Use I pump transdermal,  under each arm QD   No facility-administered encounter medications on file as of 03/07/2019.     Surgical History: Past Surgical History:  Procedure Laterality Date  . HAND SURGERY Bilateral   . HERNIA REPAIR     x 2  . NOSE SURGERY    . REPLACEMENT TOTAL KNEE      Medical History: Past Medical History:  Diagnosis Date  . Anxiety and depression   . Frequency   . Gallstones 03/05/2013  . Hypertension   . Kidney problem   May  2013  . Mixed hyperlipidemia   . OAB (overactive bladder)   . Obstructive sleep apnea (adult) (pediatric)     Family History: Family History  Problem Relation Age of Onset  . COPD Mother   . Heart disease Mother   . Cancer Father        Brain tumor  . Lung cancer Paternal Uncle   . Cancer - Other Maternal Grandmother        Back  . Kidney disease Neg Hx   . Prostate cancer Neg Hx     Social History   Socioeconomic History  . Marital status: Married    Spouse name: Not on file  . Number of children: Not on file  . Years of education: Not on file  . Highest education level: Not on file  Occupational History  . Not on file  Social Needs  . Financial resource strain: Not on file  .  Food insecurity    Worry: Not on file    Inability: Not on file  . Transportation needs    Medical: Not on file    Non-medical: Not on file  Tobacco Use  . Smoking status: Current Some Day Smoker    Types: Cigars  . Smokeless tobacco: Current User    Types: Chew  . Tobacco comment: smokes cigars onces in a while  Substance and Sexual Activity  . Alcohol use: No    Alcohol/week: 0.0 standard drinks  . Drug use: No  . Sexual activity: Not on file  Lifestyle  . Physical activity    Days per week: Not on file    Minutes per session: Not on file  . Stress: Not on file  Relationships  . Social Herbalist on phone: Not on file    Gets together: Not on file    Attends religious service: Not on file    Active member of club or organization:  Not on file    Attends meetings of clubs or organizations: Not on file    Relationship status: Not on file  . Intimate partner violence    Fear of current or ex partner: Not on file    Emotionally abused: Not on file    Physically abused: Not on file    Forced sexual activity: Not on file  Other Topics Concern  . Not on file  Social History Narrative  . Not on file      Review of Systems  Constitutional: Negative for activity change, fatigue and unexpected weight change.       Patient has lost 20 pounds since 07/2018.   HENT: Negative for rhinorrhea, sore throat and voice change.   Respiratory: Negative for chest tightness, shortness of breath and wheezing.   Cardiovascular: Negative for chest pain and palpitations.  Gastrointestinal: Negative for constipation, diarrhea, nausea and vomiting.  Endocrine: Negative for cold intolerance, heat intolerance, polydipsia and polyuria.  Musculoskeletal: Positive for arthralgias, back pain, joint swelling, myalgias and neck pain.       Patient seeing orthopedics and physical therapy at this time. Pain and swelling gradually improving.   Skin: Negative for rash.  Allergic/Immunologic: Negative for environmental allergies.  Neurological: Positive for headaches. Negative for dizziness.       History of migraines. Has had a few off and on, but nothing out of the ordinary.  Hematological: Negative for adenopathy.  Psychiatric/Behavioral: Positive for dysphoric mood. Negative for suicidal ideas.       Well managed on current dose of prozac.     Today's Vitals   03/07/19 0834  BP: 131/78  Pulse: 67  Resp: 16  Temp: (!) 96.9 F (36.1 C)  SpO2: 94%  Weight: (!) 310 lb (140.6 kg)  Height: 5\' 9"  (1.753 m)   Body mass index is 45.78 kg/m.  Physical Exam Vitals signs and nursing note reviewed.  Constitutional:      General: He is not in acute distress.    Appearance: He is well-developed. He is obese. He is not diaphoretic.  HENT:      Head: Normocephalic and atraumatic.     Mouth/Throat:     Pharynx: No oropharyngeal exudate.  Eyes:     Pupils: Pupils are equal, round, and reactive to light.  Neck:     Musculoskeletal: Neck supple. Decreased range of motion. Spinous process tenderness and muscular tenderness present.     Thyroid: No thyromegaly.     Vascular:  No carotid bruit or JVD.     Trachea: No tracheal deviation.  Cardiovascular:     Rate and Rhythm: Normal rate and regular rhythm.     Heart sounds: Normal heart sounds. No murmur. No friction rub. No gallop.   Pulmonary:     Effort: Pulmonary effort is normal. No respiratory distress.     Breath sounds: Normal breath sounds. No wheezing or rales.  Chest:     Chest wall: No tenderness.  Abdominal:     General: Bowel sounds are normal.     Palpations: Abdomen is soft.  Musculoskeletal:     Comments: Right shoulder tenderness with reduced ROM and strength. There is generalized joint pain without point tenderness present.   Lymphadenopathy:     Cervical: No cervical adenopathy.  Skin:    General: Skin is warm and dry.  Neurological:     Mental Status: He is alert and oriented to person, place, and time.     Cranial Nerves: No cranial nerve deficit.  Psychiatric:        Mood and Affect: Mood is depressed.        Speech: Speech normal.        Behavior: Behavior normal.        Thought Content: Thought content normal.        Judgment: Judgment normal.    Assessment/Plan:  1. Essential (primary) hypertension Stable. Continue bp medication as prescribed   2. Cervical disc disease with myelopathy Continue to follow up with orthopedics as scheduled.   3. Benign prostatic hyperplasia, unspecified whether lower urinary tract symptoms present Continue terazosin as prescribed   4. Depression, unspecified depression type Well managed. Continue prozac as prescribed. Will refill as needed   General Counseling: Nicholas verbalizes understanding of the findings of  todays visit and agrees with plan of treatment. I have discussed any further diagnostic evaluation that may be needed or ordered today. We also reviewed his medications today. he has been encouraged to call the office with any questions or concerns that should arise related to todays visit.  Hypertension Counseling:   The following hypertensive lifestyle modification were recommended and discussed:  1. Limiting alcohol intake to less than 1 oz/day of ethanol:(24 oz of beer or 8 oz of wine or 2 oz of 100-proof whiskey). 2. Take baby ASA 81 mg daily. 3. Importance of regular aerobic exercise and losing weight. 4. Reduce dietary saturated fat and cholesterol intake for overall cardiovascular health. 5. Maintaining adequate dietary potassium, calcium, and magnesium intake. 6. Regular monitoring of the blood pressure. 7. Reduce sodium intake to less than 100 mmol/day (less than 2.3 gm of sodium or less than 6 gm of sodium choride)   This patient was seen by [place] with Dr Lavera Guise as a part of collaborative care agreement  Time spent: 27 Minutes      Dr Lavera Guise Internal medicine

## 2019-03-20 ENCOUNTER — Ambulatory Visit (INDEPENDENT_AMBULATORY_CARE_PROVIDER_SITE_OTHER): Payer: Medicare HMO

## 2019-03-20 DIAGNOSIS — G4733 Obstructive sleep apnea (adult) (pediatric): Secondary | ICD-10-CM | POA: Diagnosis not present

## 2019-03-20 NOTE — Progress Notes (Signed)
95 percentile pressure 11.4   95th percentile leak 29.8   apnea index 0.4 /hr  apnea-hypopnea index  1.4 /hr   total days used  >4 hr 88 days  total days used <4 hr 2 days  Total compliance 98 percent  Jimmy Norton is doing great. No problems or questions

## 2019-03-23 DIAGNOSIS — G4733 Obstructive sleep apnea (adult) (pediatric): Secondary | ICD-10-CM | POA: Diagnosis not present

## 2019-03-25 ENCOUNTER — Other Ambulatory Visit: Payer: Self-pay

## 2019-03-25 DIAGNOSIS — I10 Essential (primary) hypertension: Secondary | ICD-10-CM

## 2019-03-25 MED ORDER — LOSARTAN POTASSIUM 25 MG PO TABS: 25.0000 mg | ORAL_TABLET | Freq: Every day | ORAL | 3 refills | Status: DC

## 2019-04-11 ENCOUNTER — Ambulatory Visit: Payer: Medicare HMO | Admitting: Internal Medicine

## 2019-04-11 ENCOUNTER — Encounter: Payer: Self-pay | Admitting: Internal Medicine

## 2019-04-11 ENCOUNTER — Other Ambulatory Visit: Payer: Self-pay

## 2019-04-11 VITALS — BP 130/64 | HR 72 | Resp 16 | Ht 69.0 in | Wt 313.0 lb

## 2019-04-11 DIAGNOSIS — Z9989 Dependence on other enabling machines and devices: Secondary | ICD-10-CM | POA: Diagnosis not present

## 2019-04-11 DIAGNOSIS — G4733 Obstructive sleep apnea (adult) (pediatric): Secondary | ICD-10-CM | POA: Diagnosis not present

## 2019-04-11 DIAGNOSIS — I10 Essential (primary) hypertension: Secondary | ICD-10-CM

## 2019-04-11 DIAGNOSIS — Z6841 Body Mass Index (BMI) 40.0 and over, adult: Secondary | ICD-10-CM

## 2019-04-11 NOTE — Progress Notes (Signed)
[facility] [place], [place] [address]  Pulmonary Sleep Medicine   Office Visit Note  Patient Name: Jimmy Norton. DOB: 18-Aug-1945 MRN [id number]  Date of Service: 04/11/2019  Complaints/HPI: Pt is here for follow up on osa.  Overall he is doing well. He is replacing his filters and tubing.  He is cleaning his machine by hand.  He denies any issues.  No chest pain, palpitations, SOB, headaches, or hemoptysis   ROS  General: (-) fever, (-) chills, (-) night sweats, (-) weakness Skin: (-) rashes, (-) itching,. Eyes: (-) visual changes, (-) redness, (-) itching. Nose and Sinuses: (-) nasal stuffiness or itchiness, (-) postnasal drip, (-) nosebleeds, (-) sinus trouble. Mouth and Throat: (-) sore throat, (-) hoarseness. Neck: (-) swollen glands, (-) enlarged thyroid, (-) neck pain. Respiratory: - cough, (-) bloody sputum, - shortness of breath, - wheezing. Cardiovascular: - ankle swelling, (-) chest pain. Lymphatic: (-) lymph node enlargement. Neurologic: (-) numbness, (-) tingling. Psychiatric: (-) anxiety, (-) depression   Current Medication: Outpatient Encounter Medications as of 04/11/2019  Medication Sig  . cholecalciferol (VITAMIN D) 400 UNITS TABS tablet Take 400 Units by mouth daily.  . Cyanocobalamin (B-12) 2500 MCG TABS Take 1 tablet by mouth daily.  Marland Kitchen FLUoxetine (PROZAC) 10 MG capsule Take 1 capsule (10 mg total) by mouth daily.  Marland Kitchen glucosamine-chondroitin 500-400 MG tablet Take 1 tablet by mouth 2 (two) times daily.  Marland Kitchen losartan (COZAAR) 25 MG tablet Take 1 tablet (25 mg total) by mouth daily.  . meloxicam (MOBIC) 15 MG tablet Take 1 tablet (15 mg total) by mouth daily.  . Misc Natural Products (OSTEO BI-FLEX/5-LOXIN ADVANCED) TABS Take by mouth daily.  . Misc. Devices MISC cpap  . Multiple Vitamins-Minerals (MULTIVITAMIN WITH MINERALS) tablet Take 1 tablet by mouth daily. Reported on 09/11/2015  . Probiotic Product (PROBIOTIC-10 PO) Take by  mouth.  . Pyridoxine HCl (B-6) 100 MG TABS Take by mouth.  . terazosin (HYTRIN) 10 MG capsule Take 1 capsule (10 mg total) by mouth at bedtime.  Marland Kitchen tiZANidine (ZANAFLEX) 4 MG tablet Take by mouth.  . triamcinolone (KENALOG) 0.025 % ointment Apply 1 application topically 2 (two) times daily.   No facility-administered encounter medications on file as of 04/11/2019.     Surgical History: Past Surgical History:  Procedure Laterality Date  . HAND SURGERY Bilateral   . HERNIA REPAIR     x 2  . NOSE SURGERY    . REPLACEMENT TOTAL KNEE      Medical History: Past Medical History:  Diagnosis Date  . Anxiety and depression   . Frequency   . Gallstones 03/05/2013  . Hypertension   . Kidney problem   May  2013  . Mixed hyperlipidemia   . OAB (overactive bladder)   . Obstructive sleep apnea (adult) (pediatric)     Family History: Family History  Problem Relation Age of Onset  . COPD Mother   . Heart disease Mother   . Cancer Father        Brain tumor  . Lung cancer Paternal Uncle   . Cancer - Other Maternal Grandmother        Back  . Kidney disease Neg Hx   . Prostate cancer Neg Hx     Social History: Social History   Socioeconomic History  . Marital status: Married    Spouse name: Not on file  . Number of children: Not on file  . Years of education: Not on  file  . Highest education level: Not on file  Occupational History  . Not on file  Social Needs  . Financial resource strain: Not on file  . Food insecurity    Worry: Not on file    Inability: Not on file  . Transportation needs    Medical: Not on file    Non-medical: Not on file  Tobacco Use  . Smoking status: Current Some Day Smoker    Types: Cigars  . Smokeless tobacco: Current User    Types: Chew  . Tobacco comment: smokes cigars onces in a while  Substance and Sexual Activity  . Alcohol use: No    Alcohol/week: 0.0 standard drinks  . Drug use: No  . Sexual activity: Not on file  Lifestyle  .  Physical activity    Days per week: Not on file    Minutes per session: Not on file  . Stress: Not on file  Relationships  . Social Herbalist on phone: Not on file    Gets together: Not on file    Attends religious service: Not on file    Active member of club or organization: Not on file    Attends meetings of clubs or organizations: Not on file    Relationship status: Not on file  . Intimate partner violence    Fear of current or ex partner: Not on file    Emotionally abused: Not on file    Physically abused: Not on file    Forced sexual activity: Not on file  Other Topics Concern  . Not on file  Social History Narrative  . Not on file    Vital Signs: Blood pressure 130/64, pulse 72, resp. rate 16, height 5\' 9"  (1.753 m), weight (!) 313 lb (142 kg), SpO2 95 %.  Examination: General Appearance: The patient is well-developed, well-nourished, and in no distress. Skin: Gross inspection of skin unremarkable. Head: normocephalic, no gross deformities. Eyes: no gross deformities noted. ENT: ears appear grossly normal no exudates. Neck: Supple. No thyromegaly. No LAD. Respiratory: clear bilaterallty. Cardiovascular: Normal S1 and S2 without murmur or rub. Extremities: No cyanosis. pulses are equal. Neurologic: Alert and oriented. No involuntary movements.  LABS: No results found for this or any previous visit (from the past 2160 hour(s)).  Radiology: No results found.  No results found.  No results found.    Assessment and Plan: Patient Active Problem List   Diagnosis Date Noted  . Cervical stenosis of spinal canal 11/06/2018  . Screening for prostate cancer 07/30/2018  . Dysuria 07/30/2018  . Arthritis of carpometacarpal [facility]) joint of right thumb 07/02/2018  . Primary generalized (osteo)arthritis 06/10/2018  . Cervical disc disease with myelopathy 06/10/2018  . Anxiety 06/08/2018  . Depression 06/08/2018  . BPH (benign prostatic hyperplasia)  06/08/2018  . Neuropathy 06/08/2018  . Morbid obesity with BMI of 45.0-49.9, adult ([place]) 11/30/2017  . Tick bite 11/27/2017  . Plantar fasciitis 11/27/2017  . Primary osteoarthritis of right elbow 11/27/2017  . OSA on CPAP 08/24/2017  . Mixed hyperlipidemia 07/28/2017  . Allergic rhinitis due to pollen 07/28/2017  . Hypoxemia 07/28/2017  . Calculus of kidney 07/28/2017  . Chest pain, unspecified 07/28/2017  . Major depressive disorder, recurrent, moderate ([place]) 07/28/2017  . 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 Pain in left ankle and joints of left foot 07/28/2017  . Essential (primary) hypertension 07/28/2017  . Elevated hemoglobin A1c 12/02/2015  . Urge incontinence 11/25/2015  . Urinary frequency 10/21/2015  .  OAB (overactive bladder) 09/23/2015  . Frequency 09/18/2015  . Gallstones 03/05/2013    1. OSA on CPAP Continue to use CPAP as prescribed.    2. Essential (primary) hypertension Stable, continue present management.   3. Morbid obesity with BMI of 45.0-49.9, adult ([facility]) Obesity Counseling: Risk Assessment: An assessment of behavioral risk factors was made today and includes lack of exercise sedentary lifestyle, lack of portion control and poor dietary habits.  Risk Modification Advice: She was counseled on portion control guidelines. Restricting daily caloric intake to. . The detrimental long term effects of obesity on her health and ongoing poor compliance was also discussed with the patient.    General Counseling: I have discussed the findings of the evaluation and examination with Keyandre.  I have also discussed any further diagnostic evaluation thatmay be needed or ordered today. Ewel verbalizes understanding of the findings of todays visit. We also reviewed his medications today and discussed drug interactions and side effects including but not limited excessive drowsiness and altered mental states. We also discussed that there is always a risk not just to him but  also people around him. he has been encouraged to call the office with any questions or concerns that should arise related to todays visit.    Time spent: 15 This patient was seen by Orson Gear AGNP-C in Collaboration with Dr. Devona Konig as a part of collaborative care agreement.   I have personally obtained a history, examined the patient, evaluated laboratory and imaging results, formulated the assessment and plan and placed orders.    Allyne Gee, MD [facility] Pulmonary and Critical Care Sleep medicine

## 2019-04-16 DIAGNOSIS — R69 Illness, unspecified: Secondary | ICD-10-CM | POA: Diagnosis not present

## 2019-04-23 DIAGNOSIS — G4733 Obstructive sleep apnea (adult) (pediatric): Secondary | ICD-10-CM | POA: Diagnosis not present

## 2019-05-07 ENCOUNTER — Other Ambulatory Visit: Payer: Self-pay

## 2019-05-07 DIAGNOSIS — F331 Major depressive disorder, recurrent, moderate: Secondary | ICD-10-CM

## 2019-05-07 MED ORDER — FLUOXETINE HCL 10 MG PO CAPS: 10.0000 mg | ORAL_CAPSULE | Freq: Every day | ORAL | 3 refills | Status: DC

## 2019-05-23 DIAGNOSIS — G4733 Obstructive sleep apnea (adult) (pediatric): Secondary | ICD-10-CM | POA: Diagnosis not present

## 2019-06-23 DIAGNOSIS — G4733 Obstructive sleep apnea (adult) (pediatric): Secondary | ICD-10-CM | POA: Diagnosis not present

## 2019-06-24 ENCOUNTER — Other Ambulatory Visit: Payer: Self-pay

## 2019-06-24 DIAGNOSIS — N401 Enlarged prostate with lower urinary tract symptoms: Secondary | ICD-10-CM

## 2019-06-24 DIAGNOSIS — R69 Illness, unspecified: Secondary | ICD-10-CM | POA: Diagnosis not present

## 2019-06-24 MED ORDER — TERAZOSIN HCL 10 MG PO CAPS: 10.0000 mg | ORAL_CAPSULE | Freq: Every day | ORAL | 3 refills | Status: DC

## 2019-08-10 DIAGNOSIS — Z791 Long term (current) use of non-steroidal anti-inflammatories (NSAID): Secondary | ICD-10-CM | POA: Diagnosis not present

## 2019-08-10 DIAGNOSIS — N529 Male erectile dysfunction, unspecified: Secondary | ICD-10-CM | POA: Diagnosis not present

## 2019-08-10 DIAGNOSIS — G4730 Sleep apnea, unspecified: Secondary | ICD-10-CM | POA: Diagnosis not present

## 2019-08-10 DIAGNOSIS — N3281 Overactive bladder: Secondary | ICD-10-CM | POA: Diagnosis not present

## 2019-08-10 DIAGNOSIS — R69 Illness, unspecified: Secondary | ICD-10-CM | POA: Diagnosis not present

## 2019-08-10 DIAGNOSIS — I10 Essential (primary) hypertension: Secondary | ICD-10-CM | POA: Diagnosis not present

## 2019-08-10 DIAGNOSIS — Z008 Encounter for other general examination: Secondary | ICD-10-CM | POA: Diagnosis not present

## 2019-08-10 DIAGNOSIS — G8929 Other chronic pain: Secondary | ICD-10-CM | POA: Diagnosis not present

## 2019-08-10 DIAGNOSIS — R32 Unspecified urinary incontinence: Secondary | ICD-10-CM | POA: Diagnosis not present

## 2019-08-23 ENCOUNTER — Telehealth: Payer: Self-pay

## 2019-08-23 NOTE — Telephone Encounter (Signed)
Lmom for patient to screen and confirm 08-27-19 ov. 

## 2019-08-26 ENCOUNTER — Telehealth: Payer: Self-pay

## 2019-08-26 NOTE — Telephone Encounter (Signed)
Confirmed patient appt and screened for in office 08/27/19

## 2019-08-27 ENCOUNTER — Other Ambulatory Visit: Payer: Self-pay

## 2019-08-27 ENCOUNTER — Encounter: Payer: Self-pay | Admitting: Nurse Practitioner

## 2019-08-27 ENCOUNTER — Other Ambulatory Visit: Payer: Self-pay | Admitting: Nurse Practitioner

## 2019-08-27 ENCOUNTER — Ambulatory Visit (INDEPENDENT_AMBULATORY_CARE_PROVIDER_SITE_OTHER): Payer: Medicare HMO | Admitting: Nurse Practitioner

## 2019-08-27 VITALS — BP 133/74 | HR 80 | Temp 97.6°F | Ht 69.0 in | Wt 325.0 lb

## 2019-08-27 DIAGNOSIS — N400 Enlarged prostate without lower urinary tract symptoms: Secondary | ICD-10-CM

## 2019-08-27 DIAGNOSIS — I10 Essential (primary) hypertension: Secondary | ICD-10-CM | POA: Diagnosis not present

## 2019-08-27 DIAGNOSIS — E782 Mixed hyperlipidemia: Secondary | ICD-10-CM | POA: Diagnosis not present

## 2019-08-27 DIAGNOSIS — M25562 Pain in left knee: Secondary | ICD-10-CM | POA: Diagnosis not present

## 2019-08-27 DIAGNOSIS — R300 Dysuria: Secondary | ICD-10-CM

## 2019-08-27 DIAGNOSIS — R7301 Impaired fasting glucose: Secondary | ICD-10-CM | POA: Diagnosis not present

## 2019-08-27 DIAGNOSIS — Z125 Encounter for screening for malignant neoplasm of prostate: Secondary | ICD-10-CM | POA: Diagnosis not present

## 2019-08-27 DIAGNOSIS — Z1159 Encounter for screening for other viral diseases: Secondary | ICD-10-CM | POA: Diagnosis not present

## 2019-08-27 DIAGNOSIS — M5000 Cervical disc disorder with myelopathy, unspecified cervical region: Secondary | ICD-10-CM | POA: Diagnosis not present

## 2019-08-27 DIAGNOSIS — F331 Major depressive disorder, recurrent, moderate: Secondary | ICD-10-CM

## 2019-08-27 DIAGNOSIS — R69 Illness, unspecified: Secondary | ICD-10-CM | POA: Diagnosis not present

## 2019-08-27 DIAGNOSIS — Z0001 Encounter for general adult medical examination with abnormal findings: Secondary | ICD-10-CM | POA: Diagnosis not present

## 2019-08-27 MED ORDER — FLUOXETINE HCL 20 MG PO CAPS: 20.0000 mg | ORAL_CAPSULE | Freq: Every day | ORAL | 3 refills | Status: DC

## 2019-08-27 NOTE — Progress Notes (Signed)
[facility] [place], [place] [address]  Internal MEDICINE  Office Visit Note  Patient Name: Jimmy Norton  [id number]  NM:[id number]  Date of Service: 08/27/2019   Pt is here for routine health maintenance examination   Chief Complaint  Patient presents with  . Medicare Wellness  . Hypertension  . Hyperlipidemia  . Depression  . Anxiety  . Quality Metric Gaps    colonoscopy   . Fall    left knee and left leg pain due to fall,a little pain in left ankle,  right leg pain as well     The patient is here for health maintenance exam. Today, he states that he fell about six week ago, essentially doing a split. He injured the inner part of the left knee, and it radiates into the the left upper leg and groin. He states that pain was so severe in his knee, at one point, he could hardly walk without a limp. He states this is gradually improving, but still as a great deal of pain in the medial aspect of the left knee. He states that since the fall, his lower back pain has also become worse. Was seeing orthopedic provider at [facility] clinic for this. Once pandemic started, several appointments were cancelled by that office, and he states he has not returned.  The patient does have a primary care provider in [place] hospital system. She believes he needs to have increased dose of prozac. He is currently on 10mg  daily. She thinks a 20mg  dose may be helpful. Due to isolation from COVID 19 and problems with physical health have got him down. Really struggling with worsening depression. He states that he has appointment 09/02/2019 to discuss mental health issues.  The patient is due for colon cancer screening. He states that he recently did cologuard procedure through [employer]. Results are not available yet. He is due to have routine, fasting labs done.   Current Medication: Outpatient Encounter Medications as of 08/27/2019  Medication Sig  . cholecalciferol (VITAMIN D) 400 UNITS TABS  tablet Take 400 Units by mouth daily.  . Cyanocobalamin (B-12) 2500 MCG TABS Take 1 tablet by mouth daily.  Marland Kitchen FLUoxetine (PROZAC) 20 MG capsule Take 1 capsule (20 mg total) by mouth daily.  Marland Kitchen glucosamine-chondroitin 500-400 MG tablet Take 1 tablet by mouth 2 (two) times daily.  Marland Kitchen losartan (COZAAR) 25 MG tablet Take 1 tablet (25 mg total) by mouth daily.  . meloxicam (MOBIC) 15 MG tablet Take 1 tablet (15 mg total) by mouth daily.  . Misc Natural Products (OSTEO BI-FLEX/5-LOXIN ADVANCED) TABS Take by mouth daily.  . Misc. Devices MISC cpap  . Multiple Vitamins-Minerals (MULTIVITAMIN WITH MINERALS) tablet Take 1 tablet by mouth daily. Reported on 09/11/2015  . Probiotic Product (PROBIOTIC-10 PO) Take by mouth.  . Pyridoxine HCl (B-6) 100 MG TABS Take by mouth.  . terazosin (HYTRIN) 10 MG capsule Take 1 capsule (10 mg total) by mouth at bedtime.  Marland Kitchen tiZANidine (ZANAFLEX) 4 MG tablet Take by mouth.  . triamcinolone (KENALOG) 0.025 % ointment Apply 1 application topically 2 (two) times daily.  . [DISCONTINUED] FLUoxetine (PROZAC) 10 MG capsule Take 1 capsule (10 mg total) by mouth daily.   No facility-administered encounter medications on file as of 08/27/2019.    Surgical History: Past Surgical History:  Procedure Laterality Date  . HAND SURGERY Bilateral   . HERNIA REPAIR     x 2  . NOSE SURGERY    .  REPLACEMENT TOTAL KNEE      Medical History: Past Medical History:  Diagnosis Date  . Anxiety and depression   . Frequency   . Gallstones 03/05/2013  . Hypertension   . Kidney problem   May  2013  . Mixed hyperlipidemia   . OAB (overactive bladder)   . Obstructive sleep apnea (adult) (pediatric)     Family History: Family History  Problem Relation Age of Onset  . COPD Mother   . Heart disease Mother   . Cancer Father        Brain tumor  . Lung cancer Paternal Uncle   . Cancer - Other Maternal Grandmother        Back  . Kidney disease Neg Hx   . Prostate cancer Neg Hx        Review of Systems  Constitutional: Positive for unexpected weight change. Negative for activity change, chills and fatigue.       The patient has had a 15 pound weight gain since his last visit in office.   HENT: Negative for congestion, postnasal drip, rhinorrhea, sneezing and sore throat.   Respiratory: Negative for shortness of breath and wheezing.   Cardiovascular: Negative for chest pain and palpitations.  Gastrointestinal: Negative for abdominal pain, constipation, diarrhea, nausea and vomiting.  Endocrine: Negative for cold intolerance, heat intolerance, polydipsia and polyuria.  Genitourinary: Positive for frequency and urgency. Negative for dysuria.  Musculoskeletal: Positive for arthralgias and myalgias. Negative for back pain, joint swelling and neck pain.       Left knee pain with swelling. Pain radiates into the left upper leg and groin. Starting to have more severe lower back pain and neck pain. Was seeing orthopedic provider and doing PT through [facility]. Several appointments had been cancelled due to [place] 19 pandemic and he has not been back in some time.   Skin: Negative for rash.  Allergic/Immunologic: Negative for environmental allergies.  Neurological: Negative for dizziness, tremors, numbness and headaches.  Hematological: Negative for adenopathy. Does not bruise/bleed easily.  Psychiatric/Behavioral: Positive for dysphoric mood. Negative for behavioral problems (Depression), sleep disturbance and suicidal ideas. The patient is nervous/anxious.      Today's Vitals   08/27/19 0840  BP: 133/74  Pulse: 80  Temp: 97.6 F (36.4 C)  SpO2: 94%  Weight: (!) 325 lb (147.4 kg)  Height: 5\' 9"  (1.753 m)   Body mass index is 47.99 kg/m.  Physical Exam Vitals and nursing note reviewed.  Constitutional:      General: He is not in acute distress.    Appearance: Normal appearance. He is well-developed. He is obese. He is not diaphoretic.  HENT:     Head:  Normocephalic and atraumatic.     Mouth/Throat:     Pharynx: No oropharyngeal exudate.  Eyes:     Conjunctiva/sclera: Conjunctivae normal.     Pupils: Pupils are equal, round, and reactive to light.  Neck:     Thyroid: No thyromegaly.     Vascular: No carotid bruit or JVD.     Trachea: No tracheal deviation.  Cardiovascular:     Rate and Rhythm: Normal rate and regular rhythm.     Pulses: Normal pulses.     Heart sounds: Normal heart sounds. No murmur. No friction rub. No gallop.      Comments: Intermittent ectopic beats auscultated today. Pulmonary:     Effort: Pulmonary effort is normal. No respiratory distress.     Breath sounds: Normal breath sounds. No wheezing or  rales.  Chest:     Chest wall: No tenderness.  Abdominal:     General: Bowel sounds are normal.     Palpations: Abdomen is soft.     Tenderness: There is no abdominal tenderness.  Musculoskeletal:        General: Tenderness present. Normal range of motion.     Cervical back: Normal range of motion and neck supple.       Legs:  Lymphadenopathy:     Cervical: No cervical adenopathy.  Skin:    General: Skin is warm and dry.  Neurological:     General: No focal deficit present.     Mental Status: He is alert and oriented to person, place, and time.     Cranial Nerves: No cranial nerve deficit.  Psychiatric:        Attention and Perception: Attention and perception normal.        Mood and Affect: Affect normal. Mood is depressed. Mood is not anxious.        Speech: Speech normal.        Behavior: Behavior normal. Behavior is cooperative.        Thought Content: Thought content normal.        Cognition and Memory: Cognition and memory normal.        Judgment: Judgment normal.    Depression screen [facility] 2/9 08/27/2019 03/07/2019 11/05/2018 07/30/2018 06/08/2018  Decreased Interest 1 0 0 1 3  Down, Depressed, Hopeless 1 0 0 3 1  PHQ - 2 Score 2 0 0 4 4  Altered sleeping 3 - - 3 3  Tired, decreased energy 3 - - 1 3    Change in appetite 0 - - 1 1  Feeling bad or failure about yourself  1 - - 1 1  Trouble concentrating 3 - - - 1  Moving slowly or fidgety/restless 2 - - 1 0  Suicidal thoughts 0 - - 0 0  PHQ-9 Score 14 - - 11 13  Difficult doing work/chores Somewhat difficult - - - -    Functional Status Survey: Is the patient deaf or have difficulty hearing?: Yes Does the patient have difficulty seeing, even when wearing glasses/contacts?: Yes(plans to male appt with eye doctor) Does the patient have difficulty concentrating, remembering, or making decisions?: Yes(concentrating, remembering) Does the patient have difficulty walking or climbing stairs?: Yes Does the patient have difficulty dressing or bathing?: No Does the patient have difficulty doing errands alone such as visiting a doctor's office or shopping?: No  MMSE - [place] Exam 08/27/2019 07/30/2018  Orientation to time 5 5  Orientation to Place 5 5  Registration 3 3  Attention/ Calculation 5 5  Recall 3 3  Language- name 2 objects 2 2  Language- repeat 1 1  Language- follow 3 step command 3 3  Language- read & follow direction 1 1  Write a sentence 1 1  Copy design 1 1  Total score 30 30    Fall Risk  08/27/2019 03/07/2019 11/05/2018 07/30/2018 06/08/2018  Falls in the past year? 1 0 0 0 0  Number falls in past yr: 0 - - - -  Injury with Fall? 0 - - - -     Assessment/Plan: 1. Encounter for general adult medical examination with abnormal findings Annual health maintenance exam today. Lab slip provided to check routine, fasting labs.  2. Cervical disc disease with myelopathy New referral to [facility] orthopedics for continued evaluation and treatment.  -  Ambulatory referral to Orthopedic Surgery  3. Acute pain of left knee Refer to orthopedics for further evaluation and treatment.  - Ambulatory referral to Orthopedic Surgery  4. Essential (primary) hypertension Stable. Continue bp medication as prescribed   5.  Benign prostatic hyperplasia, unspecified whether lower urinary tract symptoms present Patient to make appointment with urology provider at [facility] for further evaluation and treatment.   6. Major depressive disorder, recurrent, moderate (HCC) Increase fluoxetine to 20mg  daily. Patient has appointment with mental health counselor on 09/02/2019.  - FLUoxetine (PROZAC) 20 MG capsule; Take 1 capsule (20 mg total) by mouth daily.  Dispense: 30 capsule; Refill: 3  7. Dysuria - UA/M w/rflx Culture, Routine  General Counseling: Friend verbalizes understanding of the findings of todays visit and agrees with plan of treatment. I have discussed any further diagnostic evaluation that may be needed or ordered today. We also reviewed his medications today. he has been encouraged to call the office with any questions or concerns that should arise related to todays visit.    Counseling:  This patient was seen by Leretha Pol FNP Collaboration with Dr Lavera Guise as a part of collaborative care agreement  Orders Placed This Encounter  Procedures  . UA/M w/rflx Culture, Routine  . Ambulatory referral to Orthopedic Surgery    Meds ordered this encounter  Medications  . FLUoxetine (PROZAC) 20 MG capsule    Sig: Take 1 capsule (20 mg total) by mouth daily.    Dispense:  30 capsule    Refill:  3    Please note increased dose .    Order Specific Question:   Supervising Provider    Answer:   Lavera Guise [id number]    Total time spent: 80 Minutes  Time spent includes review of chart, medications, test results, and follow up plan with the patient.     Lavera Guise, MD  Internal Medicine

## 2019-08-28 LAB — T4, FREE: Free T4: 0.94 ng/dL (ref 0.82–1.77)

## 2019-08-28 LAB — PSA: Prostate Specific Ag, Serum: 0.5 ng/mL (ref 0.0–4.0)

## 2019-08-28 LAB — CBC
Hematocrit: 46.1 % (ref 37.5–51.0)
Hemoglobin: 14.9 g/dL (ref 13.0–17.7)
MCH: 28.4 pg (ref 26.6–33.0)
MCHC: 32.3 g/dL (ref 31.5–35.7)
MCV: 88 fL (ref 79–97)
Platelets: 219 10*3/uL (ref 150–450)
RBC: 5.24 x10E6/uL (ref 4.14–5.80)
RDW: 13.1 % (ref 11.6–15.4)
WBC: 6 10*3/uL (ref 3.4–10.8)

## 2019-08-28 LAB — UA/M W/RFLX CULTURE, ROUTINE
Bilirubin, UA: NEGATIVE
Glucose, UA: NEGATIVE
Leukocytes,UA: NEGATIVE
Nitrite, UA: NEGATIVE
Protein,UA: NEGATIVE
RBC, UA: NEGATIVE
Specific Gravity, UA: 1.019 (ref 1.005–1.030)
Urobilinogen, Ur: 0.2 mg/dL (ref 0.2–1.0)
pH, UA: 5 (ref 5.0–7.5)

## 2019-08-28 LAB — MICROSCOPIC EXAMINATION: Casts: NONE SEEN /lpf

## 2019-08-28 LAB — COMPREHENSIVE METABOLIC PANEL
ALT: 30 IU/L (ref 0–44)
AST: 33 IU/L (ref 0–40)
Albumin/Globulin Ratio: 1.9 (ref 1.2–2.2)
Albumin: 4.1 g/dL (ref 3.7–4.7)
Alkaline Phosphatase: 92 IU/L (ref 39–117)
BUN/Creatinine Ratio: 13 (ref 10–24)
BUN: 12 mg/dL (ref 8–27)
Bilirubin Total: 0.4 mg/dL (ref 0.0–1.2)
CO2: 24 mmol/L (ref 20–29)
Calcium: 8.9 mg/dL (ref 8.6–10.2)
Chloride: 103 mmol/L (ref 96–106)
Creatinine, Ser: 0.89 mg/dL (ref 0.76–1.27)
GFR calc Af Amer: 98 mL/min/{1.73_m2} (ref >59)
GFR calc non Af Amer: 85 mL/min/{1.73_m2} (ref >59)
Globulin, Total: 2.2 g/dL (ref 1.5–4.5)
Glucose: 99 mg/dL (ref 65–99)
Potassium: 4.5 mmol/L (ref 3.5–5.2)
Sodium: 141 mmol/L (ref 134–144)
Total Protein: 6.3 g/dL (ref 6.0–8.5)

## 2019-08-28 LAB — HEPATITIS C ANTIBODY (REFLEX): HCV Ab: <0.1 s/co ratio (ref 0.0–0.9)

## 2019-08-28 LAB — LIPID PANEL W/O CHOL/HDL RATIO
Cholesterol, Total: 141 mg/dL (ref 100–199)
HDL: 38 mg/dL — ABNORMAL LOW (ref >39)
LDL Chol Calc (NIH): 71 mg/dL (ref 0–99)
Triglycerides: 188 mg/dL — ABNORMAL HIGH (ref 0–149)
VLDL Cholesterol Cal: 32 mg/dL (ref 5–40)

## 2019-08-28 LAB — TSH: TSH: 2.94 u[IU]/mL (ref 0.450–4.500)

## 2019-08-28 LAB — HCV COMMENT:

## 2019-08-28 LAB — HGB A1C W/O EAG: Hgb A1c MFr Bld: 5.8 % — ABNORMAL HIGH (ref 4.8–5.6)

## 2019-09-03 NOTE — Progress Notes (Signed)
Please let the patient know that his labs looked great. Thanks

## 2019-09-04 ENCOUNTER — Telehealth: Payer: Self-pay

## 2019-09-04 NOTE — Telephone Encounter (Signed)
Pt was notified.  

## 2019-09-04 NOTE — Telephone Encounter (Signed)
-----   Message from Ronnell Freshwater, NP sent at 09/03/2019  8:26 AM EST ----- Please let the patient know that his labs looked great. Thanks

## 2019-09-19 DIAGNOSIS — M545 Low back pain: Secondary | ICD-10-CM | POA: Diagnosis not present

## 2019-09-19 DIAGNOSIS — M5412 Radiculopathy, cervical region: Secondary | ICD-10-CM | POA: Diagnosis not present

## 2019-09-24 DIAGNOSIS — M545 Low back pain: Secondary | ICD-10-CM | POA: Diagnosis not present

## 2019-09-24 DIAGNOSIS — M5412 Radiculopathy, cervical region: Secondary | ICD-10-CM | POA: Diagnosis not present

## 2019-09-24 DIAGNOSIS — M6281 Muscle weakness (generalized): Secondary | ICD-10-CM | POA: Diagnosis not present

## 2019-10-01 DIAGNOSIS — M6281 Muscle weakness (generalized): Secondary | ICD-10-CM | POA: Diagnosis not present

## 2019-10-01 DIAGNOSIS — M545 Low back pain: Secondary | ICD-10-CM | POA: Diagnosis not present

## 2019-10-01 DIAGNOSIS — M5412 Radiculopathy, cervical region: Secondary | ICD-10-CM | POA: Diagnosis not present

## 2019-10-02 ENCOUNTER — Ambulatory Visit (INDEPENDENT_AMBULATORY_CARE_PROVIDER_SITE_OTHER): Payer: Medicare HMO

## 2019-10-02 ENCOUNTER — Other Ambulatory Visit: Payer: Self-pay

## 2019-10-02 DIAGNOSIS — G4733 Obstructive sleep apnea (adult) (pediatric): Secondary | ICD-10-CM

## 2019-10-02 NOTE — Progress Notes (Signed)
95 percentile pressure 11.1   95th percentile leak 13.1   apnea index 1.0 /hr  apnea-hypopnea index  2.0 /hr   total days used  >4 hr 86 days  total days used <4 hr 2 days  Total compliance 96 percent  He is doing great no problems or questions at this time

## 2019-10-08 ENCOUNTER — Telehealth: Payer: Self-pay

## 2019-10-08 DIAGNOSIS — M6281 Muscle weakness (generalized): Secondary | ICD-10-CM | POA: Diagnosis not present

## 2019-10-08 DIAGNOSIS — M545 Low back pain: Secondary | ICD-10-CM | POA: Diagnosis not present

## 2019-10-08 DIAGNOSIS — M5412 Radiculopathy, cervical region: Secondary | ICD-10-CM | POA: Diagnosis not present

## 2019-10-08 NOTE — Telephone Encounter (Signed)
CONFIRMED AND SCREENED FOR 10-10-19 OV. 

## 2019-10-10 ENCOUNTER — Other Ambulatory Visit: Payer: Self-pay

## 2019-10-10 ENCOUNTER — Encounter: Payer: Self-pay | Admitting: Internal Medicine

## 2019-10-10 ENCOUNTER — Ambulatory Visit: Payer: Medicare HMO | Admitting: Internal Medicine

## 2019-10-10 VITALS — BP 146/76 | HR 71 | Temp 97.3°F | Resp 16 | Ht 69.0 in | Wt 312.2 lb

## 2019-10-10 DIAGNOSIS — I10 Essential (primary) hypertension: Secondary | ICD-10-CM | POA: Diagnosis not present

## 2019-10-10 DIAGNOSIS — G4733 Obstructive sleep apnea (adult) (pediatric): Secondary | ICD-10-CM | POA: Diagnosis not present

## 2019-10-10 DIAGNOSIS — Z6841 Body Mass Index (BMI) 40.0 and over, adult: Secondary | ICD-10-CM | POA: Diagnosis not present

## 2019-10-10 DIAGNOSIS — Z9989 Dependence on other enabling machines and devices: Secondary | ICD-10-CM

## 2019-10-10 NOTE — Progress Notes (Signed)
[facility] [place], [place] [address]  Pulmonary Sleep Medicine   Office Visit Note  Patient Name: Jimmy Norton. DOB: [birth year] MRN [id number]  Date of Service: 10/10/2019  Complaints/HPI: Pt is here for pulmonary follow up.  Overall he is doing well. Pt reports good compliance with CPAP therapy. Cleaning machine by hand, and changing filters and tubing as directed. Denies headaches, sinus issues, palpitations, or hemoptysis.  His compliance is 96%.  His overall AHI is 2.0.  He denies any issues at this time.   ROS  General: (-) fever, (-) chills, (-) night sweats, (-) weakness Skin: (-) rashes, (-) itching,. Eyes: (-) visual changes, (-) redness, (-) itching. Nose and Sinuses: (-) nasal stuffiness or itchiness, (-) postnasal drip, (-) nosebleeds, (-) sinus trouble. Mouth and Throat: (-) sore throat, (-) hoarseness. Neck: (-) swollen glands, (-) enlarged thyroid, (-) neck pain. Respiratory: - cough, (-) bloody sputum, - shortness of breath, - wheezing. Cardiovascular: - ankle swelling, (-) chest pain. Lymphatic: (-) lymph node enlargement. Neurologic: (-) numbness, (-) tingling. Psychiatric: (-) anxiety, (-) depression   Current Medication: Outpatient Encounter Medications as of 10/10/2019  Medication Sig  . cholecalciferol (VITAMIN D) 400 UNITS TABS tablet Take 400 Units by mouth daily.  . Cyanocobalamin (B-12) 2500 MCG TABS Take 1 tablet by mouth daily.  Marland Kitchen FLUoxetine (PROZAC) 20 MG capsule Take 1 capsule (20 mg total) by mouth daily.  Marland Kitchen glucosamine-chondroitin 500-400 MG tablet Take 1 tablet by mouth 2 (two) times daily.  Marland Kitchen losartan (COZAAR) 25 MG tablet Take 1 tablet (25 mg total) by mouth daily.  . meloxicam (MOBIC) 15 MG tablet Take 1 tablet (15 mg total) by mouth daily.  . Misc Natural Products (OSTEO BI-FLEX/5-LOXIN ADVANCED) TABS Take by mouth daily.  . Misc. Devices MISC cpap  . Multiple Vitamins-Minerals (MULTIVITAMIN WITH MINERALS)  tablet Take 1 tablet by mouth daily. Reported on 09/11/2015  . Probiotic Product (PROBIOTIC-10 PO) Take by mouth.  . Pyridoxine HCl (B-6) 100 MG TABS Take by mouth.  . terazosin (HYTRIN) 10 MG capsule Take 1 capsule (10 mg total) by mouth at bedtime.  Marland Kitchen tiZANidine (ZANAFLEX) 4 MG tablet Take by mouth.  . triamcinolone (KENALOG) 0.025 % ointment Apply 1 application topically 2 (two) times daily.   No facility-administered encounter medications on file as of 10/10/2019.    Surgical History: Past Surgical History:  Procedure Laterality Date  . HAND SURGERY Bilateral   . HERNIA REPAIR     x 2  . NOSE SURGERY    . REPLACEMENT TOTAL KNEE      Medical History: Past Medical History:  Diagnosis Date  . Anxiety and depression   . Frequency   . Gallstones 03/05/2013  . Hypertension   . Kidney problem   May  2013  . Mixed hyperlipidemia   . OAB (overactive bladder)   . Obstructive sleep apnea (adult) (pediatric)     Family History: Family History  Problem Relation Age of Onset  . COPD Mother   . Heart disease Mother   . Cancer Father        Brain tumor  . Lung cancer Paternal Uncle   . Cancer - Other Maternal Grandmother        Back  . Kidney disease Neg Hx   . Prostate cancer Neg Hx     Social History: Social History   Socioeconomic History  . Marital status: Married    Spouse name: Not on file  .  Number of children: Not on file  . Years of education: Not on file  . Highest education level: Not on file  Occupational History  . Not on file  Tobacco Use  . Smoking status: Current Some Day Smoker    Types: Cigars  . Smokeless tobacco: Current User    Types: Chew  . Tobacco comment: smokes cigars onces in a while  Substance and Sexual Activity  . Alcohol use: No    Alcohol/week: 0.0 standard drinks  . Drug use: No  . Sexual activity: Not on file  Other Topics Concern  . Not on file  Social History Narrative  . Not on file   Social Determinants of Health    Financial Resource Strain:   . Difficulty of Paying Living Expenses:   Food Insecurity:   . Worried About Charity fundraiser in the Last Year:   . Arboriculturist in the Last Year:   Transportation Needs:   . Film/video editor (Medical):   Marland Kitchen Lack of Transportation (Non-Medical):   Physical Activity:   . Days of Exercise per Week:   . Minutes of Exercise per Session:   Stress:   . Feeling of Stress :   Social Connections:   . Frequency of Communication with Friends and Family:   . Frequency of Social Gatherings with Friends and Family:   . Attends Religious Services:   . Active Member of Clubs or Organizations:   . Attends Archivist Meetings:   Marland Kitchen Marital Status:   Intimate Partner Violence:   . Fear of Current or Ex-Partner:   . Emotionally Abused:   Marland Kitchen Physically Abused:   . Sexually Abused:     Vital Signs: Blood pressure (!) 146/76, pulse 71, temperature (!) 97.3 F (36.3 C), resp. rate 16, height 5\' 9"  (1.753 m), weight (!) 312 lb 3.2 oz (141.6 kg), SpO2 96 %.  Examination: General Appearance: The patient is well-developed, well-nourished, and in no distress. Skin: Gross inspection of skin unremarkable. Head: normocephalic, no gross deformities. Eyes: no gross deformities noted. ENT: ears appear grossly normal no exudates. Neck: Supple. No thyromegaly. No LAD. Respiratory: clear bilaterally. Cardiovascular: Normal S1 and S2 without murmur or rub. Extremities: No cyanosis. pulses are equal. Neurologic: Alert and oriented. No involuntary movements.  LABS: Recent Results (from the past 2160 hour(s))  UA/M w/rflx Culture, Routine     Status: Abnormal   Collection Time: 08/27/19  8:28 AM   Specimen: Urine   URINE  Result Value Ref Range   Specific Gravity, UA 1.019 1.005 - 1.030   pH, UA 5.0 5.0 - 7.5   Color, UA Yellow Yellow   Appearance Ur Clear Clear   Leukocytes,UA Negative Negative   Protein,UA Negative Negative/Trace   Glucose, UA  Negative Negative   Ketones, UA Trace (A) Negative   RBC, UA Negative Negative   Bilirubin, UA Negative Negative   Urobilinogen, Ur 0.2 0.2 - 1.0 mg/dL   Nitrite, UA Negative Negative   Microscopic Examination Comment     Comment: Microscopic follows if indicated.   Microscopic Examination See below:     Comment: Microscopic was indicated and was performed.   Urinalysis Reflex Comment     Comment: This specimen will not reflex to a Urine Culture.  Microscopic Examination     Status: None   Collection Time: 08/27/19  8:28 AM   URINE  Result Value Ref Range   WBC, UA 0-5 0 - 5 /hpf  RBC 0-2 0 - 2 /hpf   Epithelial Cells (non renal) 0-10 0 - 10 /hpf   Casts None seen None seen /lpf   Mucus, UA Present Not Estab.   Bacteria, UA Few None seen/Few  Comprehensive metabolic panel     Status: None   Collection Time: 08/27/19 10:06 AM  Result Value Ref Range   Glucose 99 65 - 99 mg/dL   BUN 12 8 - 27 mg/dL   Creatinine, Ser 0.89 0.76 - 1.27 mg/dL   GFR calc non Af Amer 85 >59 mL/min/1.73   GFR calc Af Amer 98 >59 mL/min/1.73   BUN/Creatinine Ratio 13 10 - 24   Sodium 141 134 - 144 mmol/L   Potassium 4.5 3.5 - 5.2 mmol/L   Chloride 103 96 - 106 mmol/L   CO2 24 20 - 29 mmol/L   Calcium 8.9 8.6 - 10.2 mg/dL   Total Protein 6.3 6.0 - 8.5 g/dL   Albumin 4.1 3.7 - 4.7 g/dL   Globulin, Total 2.2 1.5 - 4.5 g/dL   Albumin/Globulin Ratio 1.9 1.2 - 2.2   Bilirubin Total 0.4 0.0 - 1.2 mg/dL   Alkaline Phosphatase 92 39 - 117 IU/L   AST 33 0 - 40 IU/L   ALT 30 0 - 44 IU/L  CBC     Status: None   Collection Time: 08/27/19 10:06 AM  Result Value Ref Range   WBC 6.0 3.4 - 10.8 x10E3/uL   RBC 5.24 4.14 - 5.80 x10E6/uL   Hemoglobin 14.9 13.0 - 17.7 g/dL   Hematocrit 46.1 37.5 - 51.0 %   MCV 88 79 - 97 fL   MCH 28.4 26.6 - 33.0 pg   MCHC 32.3 31.5 - 35.7 g/dL   RDW 13.1 11.6 - 15.4 %   Platelets 219 150 - 450 x10E3/uL  Lipid Panel w/o Chol/HDL Ratio     Status: Abnormal   Collection  Time: 08/27/19 10:06 AM  Result Value Ref Range   Cholesterol, Total 141 100 - 199 mg/dL   Triglycerides 188 (H) 0 - 149 mg/dL   HDL 38 (L) >39 mg/dL   VLDL Cholesterol Cal 32 5 - 40 mg/dL   LDL Chol Calc (NIH) 71 0 - 99 mg/dL  Hgb A1c w/o eAG     Status: Abnormal   Collection Time: 08/27/19 10:06 AM  Result Value Ref Range   Hgb A1c MFr Bld 5.8 (H) 4.8 - 5.6 %    Comment:          Prediabetes: 5.7 - 6.4          Diabetes: >6.4          Glycemic control for adults with diabetes: <7.0   T4, free     Status: None   Collection Time: 08/27/19 10:06 AM  Result Value Ref Range   Free T4 0.94 0.82 - 1.77 ng/dL  TSH     Status: None   Collection Time: 08/27/19 10:06 AM  Result Value Ref Range   TSH 2.940 0.450 - 4.500 uIU/mL  PSA     Status: None   Collection Time: 08/27/19 10:06 AM  Result Value Ref Range   Prostate Specific Ag, Serum 0.5 0.0 - 4.0 ng/mL    Comment: Roche ECLIA methodology. According to the American Urological Association, Serum PSA should decrease and remain at undetectable levels after radical prostatectomy. The AUA defines biochemical recurrence as an initial PSA value 0.2 ng/mL or greater followed by a subsequent confirmatory PSA value 0.2  ng/mL or greater. Values obtained with different assay methods or kits cannot be used interchangeably. Results cannot be interpreted as absolute evidence of the presence or absence of malignant disease.   Hepatitis c antibody (reflex)     Status: None   Collection Time: 08/27/19 10:06 AM  Result Value Ref Range   HCV Ab <0.1 0.0 - 0.9 s/co ratio  HCV Comment:     Status: None   Collection Time: 08/27/19 10:06 AM  Result Value Ref Range   Comment: Comment     Comment: Non reactive HCV antibody screen is consistent with no HCV infection, unless recent infection is suspected or other evidence exists to indicate HCV infection.     Radiology: No results found.  No results found.  No results found.    Assessment  and Plan: Patient Active Problem List   Diagnosis Date Noted  . Encounter for general adult medical examination with abnormal findings 08/27/2019  . Acute pain of left knee 08/27/2019  . Cervical stenosis of spinal canal 11/06/2018  . Screening for prostate cancer 07/30/2018  . Dysuria 07/30/2018  . Arthritis of carpometacarpal [facility]) joint of right thumb 07/02/2018  . Primary generalized (osteo)arthritis 06/10/2018  . Cervical disc disease with myelopathy 06/10/2018  . Anxiety 06/08/2018  . Depression 06/08/2018  . BPH (benign prostatic hyperplasia) 06/08/2018  . Neuropathy 06/08/2018  . Morbid obesity with BMI of 45.0-49.9, adult ([place]) 11/30/2017  . Tick bite 11/27/2017  . Plantar fasciitis 11/27/2017  . Primary osteoarthritis of right elbow 11/27/2017  . OSA on CPAP 08/24/2017  . Mixed hyperlipidemia 07/28/2017  . Allergic rhinitis due to pollen 07/28/2017  . Hypoxemia 07/28/2017  . Calculus of kidney 07/28/2017  . Chest pain, unspecified 07/28/2017  . Major depressive disorder, recurrent, moderate ([place]) 07/28/2017  . 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 Pain in left ankle and joints of left foot 07/28/2017  . Essential (primary) hypertension 07/28/2017  . Elevated hemoglobin A1c 12/02/2015  . Urge incontinence 11/25/2015  . Urinary frequency 10/21/2015  . OAB (overactive bladder) 09/23/2015  . Frequency 09/18/2015  . Gallstones 03/05/2013    1. OSA on CPAP Good symptom management. Continue to use cpap as directed.   2. Essential (primary) hypertension Slightly elevated today 146/76, continue present management.   3. BMI 45.0-49.9, adult (HCC) Obesity Counseling: Risk Assessment: An assessment of behavioral risk factors was made today and includes lack of exercise sedentary lifestyle, lack of portion control and poor dietary habits.  Risk Modification Advice: She was counseled on portion control guidelines. Restricting daily caloric intake to 1800. The detrimental  long term effects of obesity on her health and ongoing poor compliance was also discussed with the patient.  4. Morbid obesity with BMI of 45.0-49.9, adult (HCC) BMI greater than 40  General Counseling: I have discussed the findings of the evaluation and examination with Lora.  I have also discussed any further diagnostic evaluation thatmay be needed or ordered today. Prem verbalizes understanding of the findings of todays visit. We also reviewed his medications today and discussed drug interactions and side effects including but not limited excessive drowsiness and altered mental states. We also discussed that there is always a risk not just to him but also people around him. he has been encouraged to call the office with any questions or concerns that should arise related to todays visit.  No orders of the defined types were placed in this encounter.    Time spent: 25 This patient was seen by Orson Gear  AGNP-C in Collaboration with Dr. Devona Konig as a part of collaborative care agreement.   I have personally obtained a history, examined the patient, evaluated laboratory and imaging results, formulated the assessment and plan and placed orders.    Allyne Gee, MD [facility] Pulmonary and Critical Care Sleep medicine

## 2019-10-18 DIAGNOSIS — M5412 Radiculopathy, cervical region: Secondary | ICD-10-CM | POA: Diagnosis not present

## 2019-10-18 DIAGNOSIS — M545 Low back pain: Secondary | ICD-10-CM | POA: Diagnosis not present

## 2019-10-18 DIAGNOSIS — M6281 Muscle weakness (generalized): Secondary | ICD-10-CM | POA: Diagnosis not present

## 2019-10-24 DIAGNOSIS — M545 Low back pain: Secondary | ICD-10-CM | POA: Diagnosis not present

## 2019-10-24 DIAGNOSIS — M6281 Muscle weakness (generalized): Secondary | ICD-10-CM | POA: Diagnosis not present

## 2019-10-24 DIAGNOSIS — M5412 Radiculopathy, cervical region: Secondary | ICD-10-CM | POA: Diagnosis not present

## 2019-12-24 ENCOUNTER — Telehealth: Payer: Self-pay

## 2019-12-24 NOTE — Telephone Encounter (Signed)
Lmom to confirm and screen for 12-26-19 ov.

## 2019-12-25 ENCOUNTER — Telehealth: Payer: Self-pay

## 2019-12-25 NOTE — Telephone Encounter (Signed)
Confirmed and screened for 12-26-19 ov.

## 2019-12-26 ENCOUNTER — Ambulatory Visit (INDEPENDENT_AMBULATORY_CARE_PROVIDER_SITE_OTHER): Payer: Medicare HMO | Admitting: Nurse Practitioner

## 2019-12-26 ENCOUNTER — Other Ambulatory Visit: Payer: Self-pay

## 2019-12-26 ENCOUNTER — Encounter: Payer: Self-pay | Admitting: Nurse Practitioner

## 2019-12-26 VITALS — BP 144/65 | HR 67 | Temp 97.0°F | Resp 16 | Ht 69.0 in | Wt 310.0 lb

## 2019-12-26 DIAGNOSIS — N401 Enlarged prostate with lower urinary tract symptoms: Secondary | ICD-10-CM

## 2019-12-26 DIAGNOSIS — I10 Essential (primary) hypertension: Secondary | ICD-10-CM | POA: Diagnosis not present

## 2019-12-26 DIAGNOSIS — F329 Major depressive disorder, single episode, unspecified: Secondary | ICD-10-CM | POA: Diagnosis not present

## 2019-12-26 DIAGNOSIS — M25562 Pain in left knee: Secondary | ICD-10-CM | POA: Diagnosis not present

## 2019-12-26 DIAGNOSIS — Z9989 Dependence on other enabling machines and devices: Secondary | ICD-10-CM | POA: Diagnosis not present

## 2019-12-26 DIAGNOSIS — F32A Depression, unspecified: Secondary | ICD-10-CM

## 2019-12-26 DIAGNOSIS — S51812A Laceration without foreign body of left forearm, initial encounter: Secondary | ICD-10-CM | POA: Insufficient documentation

## 2019-12-26 DIAGNOSIS — G4733 Obstructive sleep apnea (adult) (pediatric): Secondary | ICD-10-CM | POA: Diagnosis not present

## 2019-12-26 DIAGNOSIS — R69 Illness, unspecified: Secondary | ICD-10-CM | POA: Diagnosis not present

## 2019-12-26 MED ORDER — TERAZOSIN HCL 10 MG PO CAPS: 10.0000 mg | ORAL_CAPSULE | Freq: Every day | ORAL | 3 refills | Status: DC

## 2019-12-26 MED ORDER — TETANUS-DIPHTH-ACELL PERTUSSIS 5-2.5-18.5 LF-MCG/0.5 IM SUSP: 0.5000 mL | Freq: Once | INTRAMUSCULAR | 0 refills | Status: AC

## 2019-12-26 MED ORDER — SULFAMETHOXAZOLE-TRIMETHOPRIM 400-80 MG PO TABS: 1.0000 | ORAL_TABLET | Freq: Two times a day (BID) | ORAL | 0 refills | Status: DC

## 2019-12-26 NOTE — Progress Notes (Signed)
[facility] [place], [place] [address]  Internal MEDICINE  Office Visit Note  Patient Name: Jimmy Norton  [id number]  [id number]  Date of Service: 12/26/2019  Chief Complaint  Patient presents with  . Hypertension  . Hyperlipidemia  . Anxiety    increased fluoxetine   . Leg Pain    left knee, left ankle   . Arm Pain    something dropped on left arm at work   . Diarrhea    losing control of bowels at times     The patient is here for routine follow up. Had incident on this past Saturday when a 2X4 piece of wood fell onto his right arm. He has traumatic laceration to the left forearm. Has been keeping it clean and dry. Has been oozing some blood off and on since the initial injury. He states that, initially, he though the injury was so severe, he though his arm was broken. He does have good ROM of the elbow, the wrist, hand, and fingers. Area where laceration is present, is very sore, described as a burning sensation. He states that he did not go to ER or urgent care and did not get stitches. He does not remember when his last tetanus shot was. Also states that he had a fall about 2.5 months ago. He states that he twisted the left knee. There is swelling along the medial aspect of the left knee. It is hurting to walk. He can hear "crunching" when he walks. He states that this is the same side where he broke his ankle in 1970s and has always had some pain in the left ankle and left leg. He states that he has been relying so much on the right side, his right leg is starting to hurt him as well.  The patient states that he has not been taking any of his medications. Has not had any for a few weeks. States that he has plenty of the medications at home, but he "forgets" to take them. He does lay them out on kitchen counter so he can see them and knows he needs to take them. He states that his wife can tell he has not been taking his prozac. She tells him he is being  grumpy and rather irritable.       Current Medication: Outpatient Encounter Medications as of 12/26/2019  Medication Sig  . cholecalciferol (VITAMIN D) 400 UNITS TABS tablet Take 400 Units by mouth daily.  . Cyanocobalamin (B-12) 2500 MCG TABS Take 1 tablet by mouth daily.  Marland Kitchen FLUoxetine (PROZAC) 20 MG capsule Take 1 capsule (20 mg total) by mouth daily.  Marland Kitchen glucosamine-chondroitin 500-400 MG tablet Take 1 tablet by mouth 2 (two) times daily.  Marland Kitchen losartan (COZAAR) 25 MG tablet Take 1 tablet (25 mg total) by mouth daily.  . meloxicam (MOBIC) 15 MG tablet Take 1 tablet (15 mg total) by mouth daily.  . Misc Natural Products (OSTEO BI-FLEX/5-LOXIN ADVANCED) TABS Take by mouth daily.  . Misc. Devices MISC cpap  . Multiple Vitamins-Minerals (MULTIVITAMIN WITH MINERALS) tablet Take 1 tablet by mouth daily. Reported on 09/11/2015  . Probiotic Product (PROBIOTIC-10 PO) Take by mouth.  . Pyridoxine HCl (B-6) 100 MG TABS Take by mouth.  . terazosin (HYTRIN) 10 MG capsule Take 1 capsule (10 mg total) by mouth at bedtime.  Marland Kitchen tiZANidine (ZANAFLEX) 4 MG tablet Take by mouth.  . triamcinolone (KENALOG) 0.025 % ointment Apply 1 application topically 2 (two)  times daily.  . [DISCONTINUED] terazosin (HYTRIN) 10 MG capsule Take 1 capsule (10 mg total) by mouth at bedtime.  . sulfamethoxazole-trimethoprim (BACTRIM) 400-80 MG tablet Take 1 tablet by mouth 2 (two) times daily.  . Tdap (BOOSTRIX) 5-2.5-18.5 LF-MCG/0.5 injection Inject 0.5 mLs into the muscle once for 1 dose.   No facility-administered encounter medications on file as of 12/26/2019.    Surgical History: Past Surgical History:  Procedure Laterality Date  . HAND SURGERY Bilateral   . HERNIA REPAIR     x 2  . NOSE SURGERY    . REPLACEMENT TOTAL KNEE      Medical History: Past Medical History:  Diagnosis Date  . Anxiety and depression   . Frequency   . Gallstones 03/05/2013  . Hypertension   . Kidney problem   May  2013  . Mixed  hyperlipidemia   . OAB (overactive bladder)   . Obstructive sleep apnea (adult) (pediatric)     Family History: Family History  Problem Relation Age of Onset  . COPD Mother   . Heart disease Mother   . Cancer Father        Brain tumor  . Lung cancer Paternal Uncle   . Cancer - Other Maternal Grandmother        Back  . Kidney disease Neg Hx   . Prostate cancer Neg Hx     Social History   Socioeconomic History  . Marital status: Married    Spouse name: Not on file  . Number of children: Not on file  . Years of education: Not on file  . Highest education level: Not on file  Occupational History  . Not on file  Tobacco Use  . Smoking status: Current Some Day Smoker    Types: Cigars  . Smokeless tobacco: Current User    Types: Chew  . Tobacco comment: smokes cigars onces in a while  Substance and Sexual Activity  . Alcohol use: No    Alcohol/week: 0.0 standard drinks  . Drug use: No  . Sexual activity: Not on file  Other Topics Concern  . Not on file  Social History Narrative  . Not on file   Social Determinants of Health   Financial Resource Strain:   . Difficulty of Paying Living Expenses:   Food Insecurity:   . Worried About Charity fundraiser in the Last Year:   . Arboriculturist in the Last Year:   Transportation Needs:   . Film/video editor (Medical):   Marland Kitchen Lack of Transportation (Non-Medical):   Physical Activity:   . Days of Exercise per Week:   . Minutes of Exercise per Session:   Stress:   . Feeling of Stress :   Social Connections:   . Frequency of Communication with Friends and Family:   . Frequency of Social Gatherings with Friends and Family:   . Attends Religious Services:   . Active Member of Clubs or Organizations:   . Attends Archivist Meetings:   Marland Kitchen Marital Status:   Intimate Partner Violence:   . Fear of Current or Ex-Partner:   . Emotionally Abused:   Marland Kitchen Physically Abused:   . Sexually Abused:       Review of  Systems  Constitutional: Negative for activity change, chills, fatigue and unexpected weight change.       He has had a two pound weight gain since his last visit .  HENT: Negative for congestion, postnasal drip, rhinorrhea, sneezing  and sore throat.   Respiratory: Negative for shortness of breath and wheezing.   Cardiovascular: Negative for chest pain and palpitations.  Gastrointestinal: Negative for abdominal pain, constipation, diarrhea, nausea and vomiting.  Endocrine: Negative for cold intolerance, heat intolerance, polydipsia and polyuria.  Genitourinary: Positive for frequency and urgency. Negative for dysuria.  Musculoskeletal: Positive for arthralgias and myalgias. Negative for back pain, joint swelling and neck pain.       Left knee pain with swelling. Pain radiates into the left upper leg and groin. This became worse after having a fall about 2.5 months ago. Swelling and pain has become worse.   Skin: Positive for wound. Negative for rash.       Laceration across the left forearm. Red, tender. Oozing some blood.  Allergic/Immunologic: Negative for environmental allergies.  Neurological: Negative for dizziness, tremors, numbness and headaches.  Hematological: Negative for adenopathy. Does not bruise/bleed easily.  Psychiatric/Behavioral: Positive for dysphoric mood. Negative for behavioral problems (Depression), sleep disturbance and suicidal ideas. The patient is nervous/anxious.        Symptoms worse recently as he has not been taking prescribed medications.    Today's Vitals   12/26/19 0839  BP: (!) 144/65  Pulse: 67  Resp: 16  Temp: (!) 97 F (36.1 C)  SpO2: 96%  Weight: (!) 310 lb (140.6 kg)  Height: 5\' 9"  (1.753 m)   Body mass index is 45.78 kg/m.  Physical Exam Vitals and nursing note reviewed.  Constitutional:      General: He is not in acute distress.    Appearance: Normal appearance. He is well-developed. He is obese. He is not diaphoretic.  HENT:     Head:  Normocephalic and atraumatic.     Mouth/Throat:     Pharynx: No oropharyngeal exudate.  Eyes:     Conjunctiva/sclera: Conjunctivae normal.     Pupils: Pupils are equal, round, and reactive to light.  Neck:     Thyroid: No thyromegaly.     Vascular: No carotid bruit or JVD.     Trachea: No tracheal deviation.  Cardiovascular:     Rate and Rhythm: Normal rate and regular rhythm.     Heart sounds: Normal heart sounds. No murmur. No friction rub. No gallop.   Pulmonary:     Effort: Pulmonary effort is normal. No respiratory distress.     Breath sounds: Normal breath sounds. No wheezing or rales.  Chest:     Chest wall: No tenderness.  Abdominal:     Palpations: Abdomen is soft.  Musculoskeletal:        General: Tenderness present. Normal range of motion.     Cervical back: Normal range of motion and neck supple.       Legs:  Lymphadenopathy:     Cervical: No cervical adenopathy.  Skin:    General: Skin is warm and dry.     Findings: Laceration present.       Neurological:     General: No focal deficit present.     Mental Status: He is alert and oriented to person, place, and time.     Cranial Nerves: No cranial nerve deficit.  Psychiatric:        Attention and Perception: Attention and perception normal.        Mood and Affect: Affect normal. Mood is depressed. Mood is not anxious.        Speech: Speech normal.        Behavior: Behavior normal. Behavior is cooperative.  Thought Content: Thought content normal.        Cognition and Memory: Cognition and memory normal.        Judgment: Judgment normal.    Assessment/Plan: 1. Laceration of left forearm without complication, initial encounter Cleaned and redressed laceration at today's visit. Advised patient to keep wound clean and dry, changing the dressing twice daily. Started bactrim twice daily for next 10 days. Sent prescription for tetanus booster to his pharmacy for administration.  - Tdap ([place]) 5-2.5-18.5  LF-MCG/0.5 injection; Inject 0.5 mLs into the muscle once for 1 dose.  Dispense: 0.5 mL; Refill: 0 - sulfamethoxazole-trimethoprim (BACTRIM) 400-80 MG tablet; Take 1 tablet by mouth 2 (two) times daily.  Dispense: 20 tablet; Refill: 0  2. Left knee pain, unspecified chronicity Recently much worse, after injury about 2 months ago. Refer to orthopedics for further evaluation and treatment.  - Ambulatory referral to Orthopedic Surgery  3. Essential (primary) hypertension Advised patient to restart bp medications daily. He voiced understanding and stated he would try to do better with this.   4. Depression, unspecified depression type Strongly recommended patient restart his prozac. Again, he voiced understanding and stated he would try to do better.  5. Benign prostatic hyperplasia with lower urinary tract symptoms, symptom details unspecified - terazosin (HYTRIN) 10 MG capsule; Take 1 capsule (10 mg total) by mouth at bedtime.  Dispense: 30 capsule; Refill: 3  6. OSA on CPAP Patient to make appointment with pulmonology for continued CPAP management.   General Counseling: Levander verbalizes understanding of the findings of todays visit and agrees with plan of treatment. I have discussed any further diagnostic evaluation that may be needed or ordered today. We also reviewed his medications today. he has been encouraged to call the office with any questions or concerns that should arise related to todays visit.  This patient was seen by Leretha Pol FNP Collaboration with Dr Lavera Guise as a part of collaborative care agreement  Orders Placed This Encounter  Procedures  . Ambulatory referral to Orthopedic Surgery    Meds ordered this encounter  Medications  . Tdap (BOOSTRIX) 5-2.5-18.5 LF-MCG/0.5 injection    Sig: Inject 0.5 mLs into the muscle once for 1 dose.    Dispense:  0.5 mL    Refill:  0    Order Specific Question:   Supervising Provider    Answer:   Lavera Guise [id number]  .  sulfamethoxazole-trimethoprim (BACTRIM) 400-80 MG tablet    Sig: Take 1 tablet by mouth 2 (two) times daily.    Dispense:  20 tablet    Refill:  0    Order Specific Question:   Supervising Provider    Answer:   Lavera Guise [id number]  . terazosin (HYTRIN) 10 MG capsule    Sig: Take 1 capsule (10 mg total) by mouth at bedtime.    Dispense:  30 capsule    Refill:  3    Order Specific Question:   Supervising Provider    Answer:   Lavera Guise [id number]    Total time spent: 52 Minutes Time spent includes review of chart, medications, test results, and follow up plan with the patient.      Dr Lavera Guise Internal medicine

## 2020-01-15 DIAGNOSIS — M1712 Unilateral primary osteoarthritis, left knee: Secondary | ICD-10-CM | POA: Diagnosis not present

## 2020-01-15 DIAGNOSIS — M25552 Pain in left hip: Secondary | ICD-10-CM | POA: Diagnosis not present

## 2020-01-15 DIAGNOSIS — M25562 Pain in left knee: Secondary | ICD-10-CM | POA: Diagnosis not present

## 2020-01-15 DIAGNOSIS — M1612 Unilateral primary osteoarthritis, left hip: Secondary | ICD-10-CM | POA: Diagnosis not present

## 2020-03-17 ENCOUNTER — Telehealth: Payer: Self-pay | Admitting: *Deleted

## 2020-03-17 NOTE — Telephone Encounter (Signed)
Left message for patient to call and schedule Echo in [facility] loffice

## 2020-03-19 ENCOUNTER — Other Ambulatory Visit: Payer: Self-pay | Admitting: Internal Medicine

## 2020-03-19 DIAGNOSIS — R079 Chest pain, unspecified: Secondary | ICD-10-CM

## 2020-03-20 ENCOUNTER — Other Ambulatory Visit: Payer: Self-pay

## 2020-03-20 ENCOUNTER — Ambulatory Visit (INDEPENDENT_AMBULATORY_CARE_PROVIDER_SITE_OTHER): Payer: No Typology Code available for payment source

## 2020-03-20 DIAGNOSIS — R079 Chest pain, unspecified: Secondary | ICD-10-CM | POA: Diagnosis not present

## 2020-03-20 MED ORDER — PERFLUTREN LIPID MICROSPHERE
1.0000 mL | INTRAVENOUS | Status: AC | PRN
  Administered 2020-03-20 (×2): 2 mL via INTRAVENOUS

## 2020-03-21 LAB — ECHOCARDIOGRAM COMPLETE
AR max vel: 4.19 cm2
AV Area VTI: 4.05 cm2
AV Area mean vel: 3.92 cm2
AV Mean grad: 2 mmHg
AV Peak grad: 3.8 mmHg
Ao pk vel: 0.97 m/s
Area-P 1/2: 2.92 cm2
S' Lateral: 3.2 cm

## 2020-03-26 ENCOUNTER — Telehealth: Payer: Self-pay

## 2020-03-26 NOTE — Telephone Encounter (Signed)
Lmom to confirm and screen for 03-31-20 ov.

## 2020-03-31 ENCOUNTER — Encounter: Payer: Self-pay | Admitting: Nurse Practitioner

## 2020-03-31 ENCOUNTER — Other Ambulatory Visit: Payer: Self-pay

## 2020-03-31 ENCOUNTER — Ambulatory Visit (INDEPENDENT_AMBULATORY_CARE_PROVIDER_SITE_OTHER): Payer: Medicare HMO | Admitting: Nurse Practitioner

## 2020-03-31 VITALS — BP 132/83 | HR 92 | Temp 97.5°F | Resp 16 | Ht 69.0 in | Wt 310.8 lb

## 2020-03-31 DIAGNOSIS — I10 Essential (primary) hypertension: Secondary | ICD-10-CM

## 2020-03-31 DIAGNOSIS — F331 Major depressive disorder, recurrent, moderate: Secondary | ICD-10-CM

## 2020-03-31 DIAGNOSIS — S50812D Abrasion of left forearm, subsequent encounter: Secondary | ICD-10-CM

## 2020-03-31 DIAGNOSIS — G8929 Other chronic pain: Secondary | ICD-10-CM | POA: Diagnosis not present

## 2020-03-31 DIAGNOSIS — R55 Syncope and collapse: Secondary | ICD-10-CM

## 2020-03-31 DIAGNOSIS — M25562 Pain in left knee: Secondary | ICD-10-CM

## 2020-03-31 DIAGNOSIS — S80811A Abrasion, right lower leg, initial encounter: Secondary | ICD-10-CM

## 2020-03-31 DIAGNOSIS — Z9989 Dependence on other enabling machines and devices: Secondary | ICD-10-CM

## 2020-03-31 DIAGNOSIS — R69 Illness, unspecified: Secondary | ICD-10-CM | POA: Diagnosis not present

## 2020-03-31 DIAGNOSIS — G4733 Obstructive sleep apnea (adult) (pediatric): Secondary | ICD-10-CM

## 2020-03-31 NOTE — Progress Notes (Unsigned)
Scanned in patient labs. Jimmy Norton
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
## 2020-04-01 ENCOUNTER — Ambulatory Visit: Payer: Medicare HMO

## 2020-04-06 ENCOUNTER — Telehealth: Payer: Self-pay

## 2020-04-06 NOTE — Telephone Encounter (Signed)
lmom CONFIRMED PT APPT 04/08/20

## 2020-04-06 NOTE — Telephone Encounter (Signed)
Confirmed and screened for 04-08-20 ov.

## 2020-04-08 ENCOUNTER — Ambulatory Visit (INDEPENDENT_AMBULATORY_CARE_PROVIDER_SITE_OTHER): Payer: Medicare HMO

## 2020-04-08 ENCOUNTER — Other Ambulatory Visit: Payer: Self-pay

## 2020-04-08 DIAGNOSIS — G4733 Obstructive sleep apnea (adult) (pediatric): Secondary | ICD-10-CM

## 2020-04-08 NOTE — Progress Notes (Signed)
95 percentile pressure 1.04   95th percentile leak 18.7   apnea index 0.7 /hr  apnea-hypopnea index  2.0 /hr   total days used  >4 hr 167 days  total days used <4 hr 10 days  Total compliance 98 percent

## 2020-04-09 ENCOUNTER — Ambulatory Visit: Payer: Medicare HMO | Admitting: Internal Medicine

## 2020-04-09 ENCOUNTER — Encounter: Payer: Self-pay | Admitting: Internal Medicine

## 2020-04-09 DIAGNOSIS — I10 Essential (primary) hypertension: Secondary | ICD-10-CM | POA: Diagnosis not present

## 2020-04-09 DIAGNOSIS — R600 Localized edema: Secondary | ICD-10-CM | POA: Diagnosis not present

## 2020-04-09 DIAGNOSIS — Z6841 Body Mass Index (BMI) 40.0 and over, adult: Secondary | ICD-10-CM | POA: Diagnosis not present

## 2020-04-09 DIAGNOSIS — Z7189 Other specified counseling: Secondary | ICD-10-CM

## 2020-04-09 DIAGNOSIS — G4733 Obstructive sleep apnea (adult) (pediatric): Secondary | ICD-10-CM

## 2020-04-09 DIAGNOSIS — Z9989 Dependence on other enabling machines and devices: Secondary | ICD-10-CM
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
## 2020-04-13 ENCOUNTER — Encounter: Payer: Self-pay | Admitting: Internal Medicine

## 2020-04-13 NOTE — Patient Instructions (Signed)

## 2020-04-15 ENCOUNTER — Telehealth: Payer: Self-pay

## 2020-04-15 DIAGNOSIS — S80811A Abrasion, right lower leg, initial encounter: Secondary | ICD-10-CM | POA: Insufficient documentation

## 2020-04-15 DIAGNOSIS — R55 Syncope and collapse: Secondary | ICD-10-CM | POA: Insufficient documentation

## 2020-04-15 DIAGNOSIS — S50812A Abrasion of left forearm, initial encounter: Secondary | ICD-10-CM | POA: Insufficient documentation

## 2020-04-15 NOTE — Telephone Encounter (Signed)
LMOM CONFIRMED PT APPT FOR 04/17/20

## 2020-04-17 ENCOUNTER — Ambulatory Visit: Payer: Medicare HMO

## 2020-04-17 ENCOUNTER — Other Ambulatory Visit: Payer: Self-pay

## 2020-04-17 DIAGNOSIS — R600 Localized edema: Secondary | ICD-10-CM | POA: Diagnosis not present

## 2020-04-23 ENCOUNTER — Other Ambulatory Visit: Payer: Self-pay

## 2020-04-23 ENCOUNTER — Ambulatory Visit (INDEPENDENT_AMBULATORY_CARE_PROVIDER_SITE_OTHER): Payer: Medicare HMO | Admitting: Hospice and Palliative Medicine

## 2020-04-23 ENCOUNTER — Encounter: Payer: Self-pay | Admitting: Hospice and Palliative Medicine

## 2020-04-23 DIAGNOSIS — Z6841 Body Mass Index (BMI) 40.0 and over, adult: Secondary | ICD-10-CM

## 2020-04-23 DIAGNOSIS — I10 Essential (primary) hypertension: Secondary | ICD-10-CM

## 2020-04-23 DIAGNOSIS — R55 Syncope and collapse: Secondary | ICD-10-CM | POA: Diagnosis not present

## 2020-04-23 DIAGNOSIS — S80811D Abrasion, right lower leg, subsequent encounter: Secondary | ICD-10-CM

## 2020-04-23 NOTE — Progress Notes (Signed)
[facility] [place], [place] [address]  Internal MEDICINE  Office Visit Note  Patient Name: Jimmy Norton  [id number]  [id number]  Date of Service: 04/24/2020  Chief Complaint  Patient presents with  . Follow-up    ultrasound review    HPI Patient is here for routine follow-up He is here to discuss the results of his [place] on his right leg [place] negative for DVT, wound as well as swelling has greatly improved He continues to be unsure as to what he wants to do about switching over the majority of his care to the [place] He is seeing the [place] for cardiology as well as neurology, he recently wore a Holter monitor--had a CT scan of his head, they are continuing to do extensive work-up due to him having a few episodes of syncope He is somewhat of a poor historian and is unable to explain the results of the testing he has done so far and what the plans are for next steps He is scheduled to see PCP at [facility] next month  He says he does not like doctors, feels that this testing is not going to show anything He does not take his medications as prescribed, misses several doses of all medications He says overall he feels good, just a few aches and pains he says comes along with aging He talks extensively about his life, his time in [place] war, struggles he has had since the war-overall he is happy with his life, continues to work hard everyday to support his family    Current Medication: Outpatient Encounter Medications as of 04/23/2020  Medication Sig  . cholecalciferol (VITAMIN D) 400 UNITS TABS tablet Take 400 Units by mouth daily.  . Cyanocobalamin (B-12) 2500 MCG TABS Take 1 tablet by mouth daily.  Marland Kitchen FLUoxetine (PROZAC) 20 MG capsule Take 1 capsule (20 mg total) by mouth daily.  Marland Kitchen glucosamine-chondroitin 500-400 MG tablet Take 1 tablet by mouth 2 (two) times daily.  Marland Kitchen losartan (COZAAR) 25 MG tablet Take 1 tablet (25 mg total) by mouth daily.  . meloxicam (MOBIC) 15 MG  tablet Take 1 tablet (15 mg total) by mouth daily.  . Misc Natural Products (OSTEO BI-FLEX/5-LOXIN ADVANCED) TABS Take by mouth daily.  . Misc. Devices MISC cpap  . Multiple Vitamins-Minerals (MULTIVITAMIN WITH MINERALS) tablet Take 1 tablet by mouth daily. Reported on 09/11/2015  . Probiotic Product (PROBIOTIC-10 PO) Take by mouth.  . Pyridoxine HCl (B-6) 100 MG TABS Take by mouth.  . terazosin (HYTRIN) 10 MG capsule Take 1 capsule (10 mg total) by mouth at bedtime.  Marland Kitchen tiZANidine (ZANAFLEX) 4 MG tablet Take by mouth.  . triamcinolone (KENALOG) 0.025 % ointment Apply 1 application topically 2 (two) times daily.   No facility-administered encounter medications on file as of 04/23/2020.    Surgical History: Past Surgical History:  Procedure Laterality Date  . HAND SURGERY Bilateral   . HERNIA REPAIR     x 2  . NOSE SURGERY    . REPLACEMENT TOTAL KNEE      Medical History: Past Medical History:  Diagnosis Date  . Anxiety and depression   . Frequency   . Gallstones 03/05/2013  . Hypertension   . Kidney problem   May  2013  . Mixed hyperlipidemia   . OAB (overactive bladder)   . Obstructive sleep apnea (adult) (pediatric)     Family History: Family History  Problem Relation Age of Onset  . COPD Mother   .  Heart disease Mother   . Cancer Father        Brain tumor  . Lung cancer Paternal Uncle   . Cancer - Other Maternal Grandmother        Back  . Kidney disease Neg Hx   . Prostate cancer Neg Hx     Social History   Socioeconomic History  . Marital status: Married    Spouse name: Not on file  . Number of children: Not on file  . Years of education: Not on file  . Highest education level: Not on file  Occupational History  . Not on file  Tobacco Use  . Smoking status: Current Some Day Smoker    Types: Cigars  . Smokeless tobacco: Current User    Types: Chew  . Tobacco comment: smokes cigars onces in a while  Vaping Use  . Vaping Use: Never used  Substance and  Sexual Activity  . Alcohol use: No    Alcohol/week: 0.0 standard drinks  . Drug use: No  . Sexual activity: Not on file  Other Topics Concern  . Not on file  Social History Narrative  . Not on file   Social Determinants of Health   Financial Resource Strain:   . Difficulty of Paying Living Expenses: Not on file  Food Insecurity:   . Worried About Charity fundraiser in the Last Year: Not on file  . Ran Out of Food in the Last Year: Not on file  Transportation Needs:   . Lack of Transportation (Medical): Not on file  . Lack of Transportation (Non-Medical): Not on file  Physical Activity:   . Days of Exercise per Week: Not on file  . Minutes of Exercise per Session: Not on file  Stress:   . Feeling of Stress : Not on file  Social Connections:   . Frequency of Communication with Friends and Family: Not on file  . Frequency of Social Gatherings with Friends and Family: Not on file  . Attends Religious Services: Not on file  . Active Member of Clubs or Organizations: Not on file  . Attends Archivist Meetings: Not on file  . Marital Status: Not on file  Intimate Partner Violence:   . Fear of Current or Ex-Partner: Not on file  . Emotionally Abused: Not on file  . Physically Abused: Not on file  . Sexually Abused: Not on file   Review of Systems  Constitutional: Negative for chills, diaphoresis, fatigue and unexpected weight change.  HENT: Negative for congestion, postnasal drip, rhinorrhea, sneezing and sore throat.   Eyes: Negative for photophobia, redness and visual disturbance.  Respiratory: Negative for cough, chest tightness and shortness of breath.   Cardiovascular: Negative for chest pain, palpitations and leg swelling.  Gastrointestinal: Negative for abdominal pain, constipation, diarrhea, nausea and vomiting.  Genitourinary: Negative for dysuria and frequency.  Musculoskeletal: Positive for arthralgias and back pain. Negative for joint swelling and neck  pain.  Skin: Negative for color change and rash.  Allergic/Immunologic: Negative for environmental allergies and food allergies.  Neurological: Negative for dizziness, tremors, light-headedness, numbness and headaches.  Hematological: Negative for adenopathy. Does not bruise/bleed easily.  Psychiatric/Behavioral: Negative for behavioral problems (Depression), sleep disturbance and suicidal ideas. The patient is not nervous/anxious.     Vital Signs: BP 124/67   Pulse 73   Temp (!) 97.2 F (36.2 C)   Resp 16   Ht 5\' 9"  (1.753 m)   Wt (!) 314 lb 6.4 oz (  142.6 kg)   SpO2 95%   BMI 46.43 kg/m    Physical Exam Vitals reviewed.  Constitutional:      Appearance: Normal appearance. He is obese.  HENT:     Mouth/Throat:     Mouth: Mucous membranes are moist.     Pharynx: Oropharynx is clear.  Cardiovascular:     Rate and Rhythm: Normal rate and regular rhythm.     Pulses: Normal pulses.     Heart sounds: Normal heart sounds.  Pulmonary:     Effort: Pulmonary effort is normal.     Breath sounds: Normal breath sounds.  Abdominal:     General: Abdomen is flat.     Palpations: Abdomen is soft.  Musculoskeletal:        General: Normal range of motion.     Cervical back: Normal range of motion.  Neurological:     General: No focal deficit present.     Mental Status: He is alert and oriented to person, place, and time. Mental status is at baseline.  Psychiatric:        Mood and Affect: Mood normal.        Behavior: Behavior normal.        Thought Content: Thought content normal.    Assessment/Plan: 1. Abrasion of right lower leg, subsequent encounter Wound from injury causing abrasion as well as swelling has resolved. [place] negative for DVT.  2. Essential (primary) hypertension BP and HR stable today, encouraged to adhere to his daily medications, continue with routine follow-up.  3. Morbid obesity with BMI of 45.0-49.9, adult [facility]) Discussed the importance of weight loss on  his overall health and well being, discussed small changes he can incorporate into his daily life that could assist with weight loss, modifying his diet as well as walking around his home each day for exercise.  4. Syncope, unspecified syncope type Has had no further episodes of syncope, being worked up by [facility] cardiology as well as neurology. Will need to adhere to follow-up appointments. Discussed that if he is not comfortable with driving to [place] we will need to set him up with cardiologist here in town for further evaluation and management.  General Counseling: Bilbo verbalizes understanding of the findings of todays visit and agrees with plan of treatment. I have discussed any further diagnostic evaluation that may be needed or ordered today. We also reviewed his medications today. he has been encouraged to call the office with any questions or concerns that should arise related to todays visit.  Time spent: 35 Minutes Time spent includes review of chart, medications, test results and follow-up plan with the patient.  This patient was seen by Theodoro Grist AGNP-C in Collaboration with Dr Lavera Guise as a part of collaborative care agreement     Tanna Furry. Kareli Hossain AGNP-C Internal medicine

## 2020-04-24 ENCOUNTER — Encounter: Payer: Self-pay | Admitting: Hospice and Palliative Medicine

## 2020-05-04 ENCOUNTER — Other Ambulatory Visit: Payer: Self-pay

## 2020-05-04 DIAGNOSIS — I10 Essential (primary) hypertension: Secondary | ICD-10-CM

## 2020-05-04 MED ORDER — LOSARTAN POTASSIUM 25 MG PO TABS
25.0000 mg | ORAL_TABLET | Freq: Every day | ORAL | 3 refills | Status: DC
Start: 2020-05-04 — End: 2020-12-15

## 2020-05-08 DIAGNOSIS — R69 Illness, unspecified: Secondary | ICD-10-CM | POA: Diagnosis not present

## 2020-05-13 ENCOUNTER — Other Ambulatory Visit: Payer: Self-pay

## 2020-05-13 DIAGNOSIS — F331 Major depressive disorder, recurrent, moderate: Secondary | ICD-10-CM

## 2020-05-13 MED ORDER — FLUOXETINE HCL 20 MG PO CAPS: 20.0000 mg | ORAL_CAPSULE | Freq: Every day | ORAL | 3 refills | Status: DC

## 2020-05-13 MED ORDER — FLUOXETINE HCL 20 MG PO CAPS
20.0000 mg | ORAL_CAPSULE | Freq: Every day | ORAL | 3 refills | Status: DC
Start: 2020-05-13 — End: 2020-05-13

## 2020-05-18 ENCOUNTER — Other Ambulatory Visit: Payer: Self-pay

## 2020-05-18 DIAGNOSIS — F331 Major depressive disorder, recurrent, moderate: Secondary | ICD-10-CM

## 2020-05-18 MED ORDER — FLUOXETINE HCL 20 MG PO CAPS: 20.0000 mg | ORAL_CAPSULE | Freq: Every day | ORAL | 3 refills | Status: DC

## 2020-07-09 ENCOUNTER — Encounter: Payer: Self-pay | Admitting: Internal Medicine

## 2020-07-09 ENCOUNTER — Other Ambulatory Visit: Payer: Self-pay

## 2020-07-09 ENCOUNTER — Ambulatory Visit: Payer: Medicare HMO | Admitting: Internal Medicine

## 2020-07-09 VITALS — BP 142/84 | HR 67 | Temp 98.0°F | Resp 16 | Ht 71.0 in | Wt 309.8 lb

## 2020-07-09 DIAGNOSIS — G4733 Obstructive sleep apnea (adult) (pediatric): Secondary | ICD-10-CM

## 2020-07-09 DIAGNOSIS — I10 Essential (primary) hypertension: Secondary | ICD-10-CM | POA: Diagnosis not present

## 2020-07-09 NOTE — Patient Instructions (Signed)

## 2020-07-09 NOTE — Progress Notes (Signed)
[facility] [place], [place] [address]  Pulmonary Sleep Medicine   Office Visit Note  Patient Name: Jimmy Norton. DOB: 1946/06/13 MRN [id number]  Date of Service: 07/09/2020  Complaints/HPI: OSA has been under good control except for residual fatigue noted on occasion. He does wake at night to urinate. Patient states that he has been needing new supplies for his machine. Last download was in September, Download looked very good with excellent control and 98% compliance.  ROS  General: (-) fever, (-) chills, (-) night sweats, (-) weakness Skin: (-) rashes, (-) itching,. Eyes: (-) visual changes, (-) redness, (-) itching. Nose and Sinuses: (-) nasal stuffiness or itchiness, (-) postnasal drip, (-) nosebleeds, (-) sinus trouble. Mouth and Throat: (-) sore throat, (-) hoarseness. Neck: (-) swollen glands, (-) enlarged thyroid, (-) neck pain. Respiratory: - cough, (-) bloody sputum, - shortness of breath, - wheezing. Cardiovascular: - ankle swelling, (-) chest pain. Lymphatic: (-) lymph node enlargement. Neurologic: (-) numbness, (-) tingling. Psychiatric: (-) anxiety, (-) depression   Current Medication: Outpatient Encounter Medications as of 07/09/2020  Medication Sig  . cholecalciferol (VITAMIN D) 400 UNITS TABS tablet Take 400 Units by mouth daily.  . Cyanocobalamin (B-12) 2500 MCG TABS Take 1 tablet by mouth daily.  Marland Kitchen FLUoxetine (PROZAC) 20 MG capsule Take 1 capsule (20 mg total) by mouth daily.  Marland Kitchen glucosamine-chondroitin 500-400 MG tablet Take 1 tablet by mouth 2 (two) times daily.  Marland Kitchen losartan (COZAAR) 25 MG tablet Take 1 tablet (25 mg total) by mouth daily.  . meloxicam (MOBIC) 15 MG tablet Take 1 tablet (15 mg total) by mouth daily.  . Misc Natural Products (OSTEO BI-FLEX/5-LOXIN ADVANCED) TABS Take by mouth daily.  . Misc. Devices MISC cpap  . Multiple Vitamins-Minerals (MULTIVITAMIN WITH MINERALS) tablet Take 1 tablet by mouth daily.  Reported on 09/11/2015  . Probiotic Product (PROBIOTIC-10 PO) Take by mouth.  . Pyridoxine HCl (B-6) 100 MG TABS Take by mouth.  . terazosin (HYTRIN) 10 MG capsule Take 1 capsule (10 mg total) by mouth at bedtime.  Marland Kitchen tiZANidine (ZANAFLEX) 4 MG tablet Take by mouth.  . triamcinolone (KENALOG) 0.025 % ointment Apply 1 application topically 2 (two) times daily.   No facility-administered encounter medications on file as of 07/09/2020.    Surgical History: Past Surgical History:  Procedure Laterality Date  . HAND SURGERY Bilateral   . HERNIA REPAIR     x 2  . NOSE SURGERY    . REPLACEMENT TOTAL KNEE      Medical History: Past Medical History:  Diagnosis Date  . Anxiety and depression   . Frequency   . Gallstones 03/05/2013  . Hypertension   . Kidney problem   May  2013  . Mixed hyperlipidemia   . OAB (overactive bladder)   . Obstructive sleep apnea (adult) (pediatric)     Family History: Family History  Problem Relation Age of Onset  . COPD Mother   . Heart disease Mother   . Cancer Father        Brain tumor  . Lung cancer Paternal Uncle   . Cancer - Other Maternal Grandmother        Back  . Kidney disease Neg Hx   . Prostate cancer Neg Hx     Social History: Social History   Socioeconomic History  . Marital status: Married    Spouse name: Not on file  . Number of children: Not on file  . Years of education:  Not on file  . Highest education level: Not on file  Occupational History  . Not on file  Tobacco Use  . Smoking status: Current Some Day Smoker    Types: Cigars  . Smokeless tobacco: Current User    Types: Chew  . Tobacco comment: smokes cigars onces in a while  Vaping Use  . Vaping Use: Never used  Substance and Sexual Activity  . Alcohol use: No    Alcohol/week: 0.0 standard drinks  . Drug use: No  . Sexual activity: Not on file  Other Topics Concern  . Not on file  Social History Narrative  . Not on file   Social Determinants of Health    Financial Resource Strain: Not on file  Food Insecurity: Not on file  Transportation Needs: Not on file  Physical Activity: Not on file  Stress: Not on file  Social Connections: Not on file  Intimate Partner Violence: Not on file    Vital Signs: Blood pressure (!) 142/84, pulse 67, temperature 98 F (36.7 C), resp. rate 16, height 5\' 11"  (1.803 m), weight (!) 309 lb 12.8 oz (140.5 kg), SpO2 95 %.  Examination: General Appearance: The patient is well-developed, well-nourished, and in no distress. Skin: Gross inspection of skin unremarkable. Head: normocephalic, no gross deformities. Eyes: no gross deformities noted. ENT: ears appear grossly normal no exudates. Neck: Supple. No thyromegaly. No LAD. Respiratory: no rhonchi noted. Cardiovascular: Normal S1 and S2 without murmur or rub. Extremities: No cyanosis. pulses are equal. Neurologic: Alert and oriented. No involuntary movements.  LABS: No results found for this or any previous visit (from the past 2160 hour(s)).  Radiology: No results found.  No results found.  No results found.    Assessment and Plan: Patient Active Problem List   Diagnosis Date Noted  . Syncope 04/15/2020  . Abrasion of left forearm 04/15/2020  . Abrasion of right lower leg 04/15/2020  . Laceration of left forearm without complication 32/35/5732  . Encounter for general adult medical examination with abnormal findings 08/27/2019  . Left knee pain 08/27/2019  . Cervical stenosis of spinal canal 11/06/2018  . Screening for prostate cancer 07/30/2018  . Dysuria 07/30/2018  . Arthritis of carpometacarpal [facility]) joint of right thumb 07/02/2018  . Primary generalized (osteo)arthritis 06/10/2018  . Cervical disc disease with myelopathy 06/10/2018  . Anxiety 06/08/2018  . Depression 06/08/2018  . BPH (benign prostatic hyperplasia) 06/08/2018  . Neuropathy 06/08/2018  . Morbid obesity with BMI of 45.0-49.9, adult ([place]) 11/30/2017  . Tick bite  11/27/2017  . Plantar fasciitis 11/27/2017  . Primary osteoarthritis of right elbow 11/27/2017  . OSA on CPAP 08/24/2017  . Mixed hyperlipidemia 07/28/2017  . Allergic rhinitis due to pollen 07/28/2017  . Hypoxemia 07/28/2017  . Calculus of kidney 07/28/2017  . Chest pain, unspecified 07/28/2017  . Major depressive disorder, recurrent, moderate ([place]) 07/28/2017  . 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 Pain in left ankle and joints of left foot 07/28/2017  . Essential (primary) hypertension 07/28/2017  . Elevated hemoglobin A1c 12/02/2015  . Urge incontinence 11/25/2015  . Urinary frequency 10/21/2015  . OAB (overactive bladder) 09/23/2015  . Frequency 09/18/2015  . Gallstones 03/05/2013    1. OSA continue with his CPAP at the present pressures. He has been having excellent compliance. His fatigue likely due to multiple factors of which his weight is one factor. Patient also needs to get his supplies updated. 2. Obesity patient needs to work on weight loss with diet and exercise  3. HTN per PCP 4. CPAP counseling provided at this time  General Counseling: I have discussed the findings of the evaluation and examination with Jacori.  I have also discussed any further diagnostic evaluation thatmay be needed or ordered today. Jacobie verbalizes understanding of the findings of todays visit. We also reviewed his medications today and discussed drug interactions and side effects including but not limited excessive drowsiness and altered mental states. We also discussed that there is always a risk not just to him but also people around him. he has been encouraged to call the office with any questions or concerns that should arise related to todays visit.  No orders of the defined types were placed in this encounter.    Time spent: 19min  I have personally obtained a history, examined the patient, evaluated laboratory and imaging results, formulated the assessment and plan and placed orders.     Allyne Gee, MD [facility] Pulmonary and Critical Care Sleep medicine

## 2020-09-21 ENCOUNTER — Encounter: Payer: Self-pay | Admitting: Hospice and Palliative Medicine

## 2020-09-21 ENCOUNTER — Ambulatory Visit (INDEPENDENT_AMBULATORY_CARE_PROVIDER_SITE_OTHER): Payer: Medicare HMO | Admitting: Hospice and Palliative Medicine

## 2020-09-21 ENCOUNTER — Other Ambulatory Visit: Payer: Self-pay

## 2020-09-21 VITALS — BP 134/80 | HR 92 | Temp 97.3°F | Resp 16 | Ht 69.0 in | Wt 312.2 lb

## 2020-09-21 DIAGNOSIS — R300 Dysuria: Secondary | ICD-10-CM | POA: Diagnosis not present

## 2020-09-21 DIAGNOSIS — E756 Lipid storage disorder, unspecified: Secondary | ICD-10-CM | POA: Diagnosis not present

## 2020-09-21 DIAGNOSIS — G4733 Obstructive sleep apnea (adult) (pediatric): Secondary | ICD-10-CM

## 2020-09-21 DIAGNOSIS — R5383 Other fatigue: Secondary | ICD-10-CM | POA: Diagnosis not present

## 2020-09-21 DIAGNOSIS — M150 Primary generalized (osteo)arthritis: Secondary | ICD-10-CM | POA: Diagnosis not present

## 2020-09-21 DIAGNOSIS — L570 Actinic keratosis: Secondary | ICD-10-CM

## 2020-09-21 DIAGNOSIS — Z6841 Body Mass Index (BMI) 40.0 and over, adult: Secondary | ICD-10-CM

## 2020-09-21 DIAGNOSIS — Z0001 Encounter for general adult medical examination with abnormal findings: Secondary | ICD-10-CM | POA: Diagnosis not present

## 2020-09-21 DIAGNOSIS — Z9119 Patient's noncompliance with other medical treatment and regimen: Secondary | ICD-10-CM | POA: Diagnosis not present

## 2020-09-21 DIAGNOSIS — E038 Other specified hypothyroidism: Secondary | ICD-10-CM | POA: Diagnosis not present

## 2020-09-21 DIAGNOSIS — Z125 Encounter for screening for malignant neoplasm of prostate: Secondary | ICD-10-CM

## 2020-09-21 DIAGNOSIS — G4734 Idiopathic sleep related nonobstructive alveolar hypoventilation: Secondary | ICD-10-CM

## 2020-09-21 DIAGNOSIS — R6889 Other general symptoms and signs: Secondary | ICD-10-CM | POA: Diagnosis not present

## 2020-09-21 DIAGNOSIS — R972 Elevated prostate specific antigen [PSA]: Secondary | ICD-10-CM | POA: Diagnosis not present

## 2020-09-21 DIAGNOSIS — Z9989 Dependence on other enabling machines and devices: Secondary | ICD-10-CM

## 2020-09-21 DIAGNOSIS — Z91199 Patient's noncompliance with other medical treatment and regimen due to unspecified reason: Secondary | ICD-10-CM

## 2020-09-21 MED ORDER — TIZANIDINE HCL 4 MG PO TABS: 4.0000 mg | ORAL_TABLET | Freq: Every evening | ORAL | 1 refills | Status: DC | PRN

## 2020-09-21 MED ORDER — MELOXICAM 15 MG PO TABS: 15.0000 mg | ORAL_TABLET | Freq: Every day | ORAL | 3 refills | Status: DC

## 2020-09-21 NOTE — Progress Notes (Addendum)
[facility] [place], [place] [address]  Internal MEDICINE  Office Visit Note  Patient Name: Jimmy Norton  [id number]  [id number]  Date of Service: 11/04/2020  Chief Complaint  Patient presents with  . Medicare Wellness    Review meds, knee pain both sides, left shoulder pain  . Depression  . Hyperlipidemia  . Hypertension  . Anxiety     HPI Pt is here for routine health maintenance examination Not taking any of his prescribed medications at this time, only taking OTC vitamins--C, D and Zinc at times Will occasionally take Prozac when his wife requests that he does due to him being "grumpy"--maybe once a month Has not taken his BP mediations since his last visit, typically takes BP meds a few days before doctor visits to ensure his BP will be controlled at that time Jimmy Norton quite a bit of care from [place] in [place] multiple specialist within [place] system--unable to review records from those providers Completed extensive cardiac work-up due to previous episodes of syncope--from his understanding his heart is healthy  Chronic pain worsening--was seen by [place], treated with joint steroid injection to his left knee--provided him minimal relief Has had friends that have been please with ortho care from [facility]--requesting a referral to be placed At last [place] appointment, had imaging done to his cervical spine and in his understanding has bulging discs and narrowing of cervical spine--thought to be causing bilateral shoulder pain and upper extremity numbness  Continues to be concerned about passing away and leaving his wife in a difficult financial situation which is why he continues to work and ignore his aches and pains  Declines colonoscopy screening, due since 2020--also declines Cologuard screening  Continues to sleep well with his CPAP every night, supposed to bleed supplemental oxygen through his CPAP for hypoxia not  controlled with CPAP--has not been able to use oxygen as his concentrator has been giving him issues for the last several months No recent changes in his appetite or issues with constipation  Current Medication: Outpatient Encounter Medications as of 09/21/2020  Medication Sig  . cholecalciferol (VITAMIN D) 400 UNITS TABS tablet Take 400 Units by mouth daily.  . Cyanocobalamin (B-12) 2500 MCG TABS Take 1 tablet by mouth daily.  Marland Kitchen FLUoxetine (PROZAC) 20 MG capsule Take 1 capsule (20 mg total) by mouth daily.  Marland Kitchen glucosamine-chondroitin 500-400 MG tablet Take 1 tablet by mouth 2 (two) times daily.  Marland Kitchen losartan (COZAAR) 25 MG tablet Take 1 tablet (25 mg total) by mouth daily.  . meloxicam (MOBIC) 15 MG tablet Take 1 tablet (15 mg total) by mouth daily.  . Misc Natural Products (OSTEO BI-FLEX/5-LOXIN ADVANCED) TABS Take by mouth daily.  . Misc. Devices MISC cpap  . Multiple Vitamins-Minerals (MULTIVITAMIN WITH MINERALS) tablet Take 1 tablet by mouth daily. Reported on 09/11/2015  . Probiotic Product (PROBIOTIC-10 PO) Take by mouth.  . Pyridoxine HCl (B-6) 100 MG TABS Take by mouth.  . terazosin (HYTRIN) 10 MG capsule Take 1 capsule (10 mg total) by mouth at bedtime.  Marland Kitchen tiZANidine (ZANAFLEX) 4 MG tablet Take 1 tablet (4 mg total) by mouth at bedtime as needed for muscle spasms.  Marland Kitchen triamcinolone (KENALOG) 0.025 % ointment Apply 1 application topically 2 (two) times daily.  . [DISCONTINUED] meloxicam (MOBIC) 15 MG tablet Take 1 tablet (15 mg total) by mouth daily.  . [DISCONTINUED] tiZANidine (ZANAFLEX) 4 MG tablet Take by mouth.   No facility-administered encounter medications on  file as of 09/21/2020.    Surgical History: Past Surgical History:  Procedure Laterality Date  . HAND SURGERY Bilateral   . HERNIA REPAIR     x 2  . NOSE SURGERY    . REPLACEMENT TOTAL KNEE      Medical History: Past Medical History:  Diagnosis Date  . Anxiety and depression   . Frequency   . Gallstones  03/05/2013  . Hypertension   . Kidney problem   May  2013  . Mixed hyperlipidemia   . OAB (overactive bladder)   . Obstructive sleep apnea (adult) (pediatric)     Family History: Family History  Problem Relation Age of Onset  . COPD Mother   . Heart disease Mother   . Cancer Father        Brain tumor  . Lung cancer Paternal Uncle   . Cancer - Other Maternal Grandmother        Back  . Kidney disease Neg Hx   . Prostate cancer Neg Hx       Review of Systems  Constitutional: Negative for chills, fatigue and unexpected weight change.  HENT: Negative for congestion, postnasal drip, rhinorrhea, sneezing and sore throat.   Eyes: Negative for redness.  Respiratory: Negative for cough, chest tightness and shortness of breath.   Cardiovascular: Negative for chest pain and palpitations.  Gastrointestinal: Negative for abdominal pain, constipation, diarrhea, nausea and vomiting.  Genitourinary: Negative for dysuria and frequency.  Musculoskeletal: Positive for arthralgias, back pain, neck pain and neck stiffness. Negative for joint swelling.  Skin: Negative for rash.  Neurological: Negative for tremors and numbness.  Hematological: Negative for adenopathy. Does not bruise/bleed easily.  Psychiatric/Behavioral: Negative for behavioral problems (Depression), sleep disturbance and suicidal ideas. The patient is not nervous/anxious.      Vital Signs: BP 134/80   Pulse 92   Temp (!) 97.3 F (36.3 C)   Resp 16   Ht 5' 9" (1.753 m)   Wt (!) 312 lb 3.2 oz (141.6 kg)   SpO2 97%   BMI 46.10 kg/m    Physical Exam Vitals reviewed.  Constitutional:      Appearance: Normal appearance. He is obese.  Cardiovascular:     Rate and Rhythm: Normal rate and regular rhythm.     Pulses: Normal pulses.     Heart sounds: Normal heart sounds.  Pulmonary:     Effort: Pulmonary effort is normal.     Breath sounds: Normal breath sounds.  Abdominal:     General: Abdomen is flat.      Palpations: Abdomen is soft.  Musculoskeletal:        General: Normal range of motion.     Cervical back: Normal range of motion.  Skin:    General: Skin is warm.     Comments: Scattered AK, ears, arms, chest  Neurological:     General: No focal deficit present.     Mental Status: He is alert and oriented to person, place, and time. Mental status is at baseline.  Psychiatric:        Mood and Affect: Mood normal.        Behavior: Behavior normal.        Thought Content: Thought content normal.        Judgment: Judgment normal.    LABS: Recent Results (from the past 2160 hour(s))  UA/M w/rflx Culture, Routine     Status: Abnormal   Collection Time: 09/21/20  9:37 AM   Specimen: Urine  Urine  Result Value Ref Range   Specific Gravity, UA 1.023 1.005 - 1.030   pH, UA 5.0 5.0 - 7.5   Color, UA Yellow Yellow   Appearance Ur Clear Clear   Leukocytes,UA Negative Negative   Protein,UA Negative Negative/Trace   Glucose, UA Negative Negative   Ketones, UA Trace (A) Negative   RBC, UA Negative Negative   Bilirubin, UA Negative Negative   Urobilinogen, Ur 0.2 0.2 - 1.0 mg/dL   Nitrite, UA Negative Negative   Microscopic Examination Comment     Comment: Microscopic follows if indicated.   Microscopic Examination See below:     Comment: Microscopic was indicated and was performed.   Urinalysis Reflex Comment     Comment: This specimen will not reflex to a Urine Culture.  Microscopic Examination     Status: None   Collection Time: 09/21/20  9:37 AM   Urine  Result Value Ref Range   WBC, UA None seen 0 - 5 /hpf   RBC None seen 0 - 2 /hpf   Epithelial Cells (non renal) 0-10 0 - 10 /hpf   Casts None seen None seen /lpf   Bacteria, UA None seen None seen/Few  CBC w/Diff/Platelet     Status: None   Collection Time: 09/21/20 11:23 AM  Result Value Ref Range   WBC 7.5 3.4 - 10.8 x10E3/uL   RBC 5.36 4.14 - 5.80 x10E6/uL   Hemoglobin 15.2 13.0 - 17.7 g/dL   Hematocrit 45.1 37.5 -  51.0 %   MCV 84 79 - 97 fL   MCH 28.4 26.6 - 33.0 pg   MCHC 33.7 31.5 - 35.7 g/dL   RDW 12.9 11.6 - 15.4 %   Platelets 257 150 - 450 x10E3/uL   Neutrophils 63 Not Estab. %   Lymphs 23 Not Estab. %   Monocytes 10 Not Estab. %   Eos 3 Not Estab. %   Basos 1 Not Estab. %   Neutrophils Absolute 4.8 1.4 - 7.0 x10E3/uL   Lymphocytes Absolute 1.7 0.7 - 3.1 x10E3/uL   Monocytes Absolute 0.7 0.1 - 0.9 x10E3/uL   EOS (ABSOLUTE) 0.2 0.0 - 0.4 x10E3/uL   Basophils Absolute 0.0 0.0 - 0.2 x10E3/uL   Immature Granulocytes 0 Not Estab. %   Immature Grans (Abs) 0.0 0.0 - 0.1 x10E3/uL  Comprehensive Metabolic Panel (CMET)     Status: Abnormal   Collection Time: 09/21/20 11:23 AM  Result Value Ref Range   Glucose 105 (H) 65 - 99 mg/dL   BUN 13 8 - 27 mg/dL   Creatinine, Ser 0.85 0.76 - 1.27 mg/dL   eGFR 91 >59 mL/min/1.73    Comment: **In accordance with recommendations from the NKF-ASN Task force,**   [facility] has updated its eGFR calculation to the 2021 CKD-EPI   creatinine equation that estimates kidney function without a race   variable.    BUN/Creatinine Ratio 15 10 - 24   Sodium 139 134 - 144 mmol/L   Potassium 4.4 3.5 - 5.2 mmol/L   Chloride 105 96 - 106 mmol/L   CO2 19 (L) 20 - 29 mmol/L   Calcium 9.2 8.6 - 10.2 mg/dL   Total Protein 6.5 6.0 - 8.5 g/dL   Albumin 4.0 3.7 - 4.7 g/dL   Globulin, Total 2.5 1.5 - 4.5 g/dL   Albumin/Globulin Ratio 1.6 1.2 - 2.2   Bilirubin Total 0.3 0.0 - 1.2 mg/dL   Alkaline Phosphatase 98 44 - 121 IU/L   AST 29 0 -  40 IU/L   ALT 23 0 - 44 IU/L  Lipid Panel With LDL/HDL Ratio     Status: Abnormal   Collection Time: 09/21/20 11:23 AM  Result Value Ref Range   Cholesterol, Total 137 100 - 199 mg/dL   Triglycerides 259 (H) 0 - 149 mg/dL   HDL 32 (L) >39 mg/dL   VLDL Cholesterol Cal 42 (H) 5 - 40 mg/dL   LDL Chol Calc (NIH) 63 0 - 99 mg/dL   LDL/HDL Ratio 2.0 0.0 - 3.6 ratio    Comment:                                     LDL/HDL Ratio                                              Men  Women                               1/2 Avg.Risk  1.0    1.5                                   Avg.Risk  3.6    3.2                                2X Avg.Risk  6.2    5.0                                3X Avg.Risk  8.0    6.1   TSH + free T4     Status: None   Collection Time: 09/21/20 11:23 AM  Result Value Ref Range   TSH 2.460 0.450 - 4.500 uIU/mL   Free T4 0.94 0.82 - 1.77 ng/dL  PSA Total (Reflex To Free)     Status: None   Collection Time: 09/21/20 11:23 AM  Result Value Ref Range   Prostate Specific Ag, Serum 0.5 0.0 - 4.0 ng/mL    Comment: Roche ECLIA methodology. According to the American Urological Association, Serum PSA should decrease and remain at undetectable levels after radical prostatectomy. The AUA defines biochemical recurrence as an initial PSA value 0.2 ng/mL or greater followed by a subsequent confirmatory PSA value 0.2 ng/mL or greater. Values obtained with different assay methods or kits cannot be used interchangeably. Results cannot be interpreted as absolute evidence of the presence or absence of malignant disease.    Reflex Criteria Comment     Comment: The percent free PSA is performed on a reflex basis only when the total PSA is between 4.0 and 10.0 ng/mL.     Assessment/Plan: 1. Encounter for routine adult health examination with abnormal findings Well appearing 75 year old male Declines colonoscopy screening at this time Due for routine fasting labs - CBC w/Diff/Platelet - Comprehensive Metabolic Panel (CMET) - Lipid Panel With LDL/HDL Ratio - TSH + free T4  2. Prostate cancer screening - PSA Total (Reflex To Free)  3. Primary generalized (osteo)arthritis Referral to [facility] For uncontrolled chronic pain encouraged to take  Meloxicam daily and Zanaflex as needed - Ambulatory referral to Orthopedic Surgery - meloxicam (MOBIC) 15 MG tablet; Take 1 tablet (15 mg total) by mouth daily.  Dispense: 30  tablet; Refill: 3 - tiZANidine (ZANAFLEX) 4 MG tablet; Take 1 tablet (4 mg total) by mouth at bedtime as needed for muscle spasms.  Dispense: 30 tablet; Refill: 1  4. Actinic keratosis - Ambulatory referral to Dermatology  5. Morbid obesity with BMI of 45.0-49.9, adult (HCC) BMI 46 Obesity Counseling: Risk Assessment: An assessment of behavioral risk factors was made today and includes lack of exercise sedentary lifestyle, lack of portion control and poor dietary habits.  Risk Modification Advice: He was counseled on portion control guidelines. Restricting daily caloric intake to 1800. The detrimental long term effects of obesity on his health and ongoing poor compliance was also discussed with the patient.  6. OSA on CPAP Continue with nightly CPAP compliance, followed by pulmonary Needs new oxygen concentrator for supplemental oxygen  7. Nocturnal hypoxia Order placed for new oxygen concentrator as his current machine is giving him issues, need supplemental oxygen to bleed through his CPAP  7. Non-adherence to medical treatment Emphasized importance of taking all prescribed medications Long discussion about importance of caring for himself to the best of his ability to help his wife  8. Other fatigue - CBC w/Diff/Platelet - Comprehensive Metabolic Panel (CMET) - Lipid Panel With LDL/HDL Ratio - TSH + free T4  9. Dysuria - UA/M w/rflx Culture, Routine  General Counseling: Zakarie verbalizes understanding of the findings of todays visit and agrees with plan of treatment. I have discussed any further diagnostic evaluation that may be needed or ordered today. We also reviewed his medications today. he has been encouraged to call the office with any questions or concerns that should arise related to todays visit.    Counseling:    Orders Placed This Encounter  Procedures  . For home use only DME oxygen  . Microscopic Examination  . UA/M w/rflx Culture, Routine  . CBC  w/Diff/Platelet  . Comprehensive Metabolic Panel (CMET)  . Lipid Panel With LDL/HDL Ratio  . TSH + free T4  . PSA Total (Reflex To Free)  . Ambulatory referral to Orthopedic Surgery  . Ambulatory referral to Dermatology    Meds ordered this encounter  Medications  . meloxicam (MOBIC) 15 MG tablet    Sig: Take 1 tablet (15 mg total) by mouth daily.    Dispense:  30 tablet    Refill:  3  . tiZANidine (ZANAFLEX) 4 MG tablet    Sig: Take 1 tablet (4 mg total) by mouth at bedtime as needed for muscle spasms.    Dispense:  30 tablet    Refill:  1    Total time spent: 45 Minutes  Time spent includes review of chart, medications, test results, and follow up plan with the patient.   This patient was seen by Theodoro Grist AGNP-C Collaboration with Dr Lavera Guise as a part of collaborative care agreement   Tanna Furry. [facility]

## 2020-09-22 LAB — COMPREHENSIVE METABOLIC PANEL
ALT: 23 IU/L (ref 0–44)
AST: 29 IU/L (ref 0–40)
Albumin/Globulin Ratio: 1.6 (ref 1.2–2.2)
Albumin: 4 g/dL (ref 3.7–4.7)
Alkaline Phosphatase: 98 IU/L (ref 44–121)
BUN/Creatinine Ratio: 15 (ref 10–24)
BUN: 13 mg/dL (ref 8–27)
Bilirubin Total: 0.3 mg/dL (ref 0.0–1.2)
CO2: 19 mmol/L — ABNORMAL LOW (ref 20–29)
Calcium: 9.2 mg/dL (ref 8.6–10.2)
Chloride: 105 mmol/L (ref 96–106)
Creatinine, Ser: 0.85 mg/dL (ref 0.76–1.27)
Globulin, Total: 2.5 g/dL (ref 1.5–4.5)
Glucose: 105 mg/dL — ABNORMAL HIGH (ref 65–99)
Potassium: 4.4 mmol/L (ref 3.5–5.2)
Sodium: 139 mmol/L (ref 134–144)
Total Protein: 6.5 g/dL (ref 6.0–8.5)
eGFR: 91 mL/min/{1.73_m2} (ref >59)

## 2020-09-22 LAB — PSA TOTAL (REFLEX TO FREE): Prostate Specific Ag, Serum: 0.5 ng/mL (ref 0.0–4.0)

## 2020-09-22 LAB — CBC WITH DIFFERENTIAL/PLATELET
Basophils Absolute: 0 10*3/uL (ref 0.0–0.2)
Basos: 1 %
EOS (ABSOLUTE): 0.2 10*3/uL (ref 0.0–0.4)
Eos: 3 %
Hematocrit: 45.1 % (ref 37.5–51.0)
Hemoglobin: 15.2 g/dL (ref 13.0–17.7)
Immature Grans (Abs): 0 10*3/uL (ref 0.0–0.1)
Immature Granulocytes: 0 %
Lymphocytes Absolute: 1.7 10*3/uL (ref 0.7–3.1)
Lymphs: 23 %
MCH: 28.4 pg (ref 26.6–33.0)
MCHC: 33.7 g/dL (ref 31.5–35.7)
MCV: 84 fL (ref 79–97)
Monocytes Absolute: 0.7 10*3/uL (ref 0.1–0.9)
Monocytes: 10 %
Neutrophils Absolute: 4.8 10*3/uL (ref 1.4–7.0)
Neutrophils: 63 %
Platelets: 257 10*3/uL (ref 150–450)
RBC: 5.36 x10E6/uL (ref 4.14–5.80)
RDW: 12.9 % (ref 11.6–15.4)
WBC: 7.5 10*3/uL (ref 3.4–10.8)

## 2020-09-22 LAB — LIPID PANEL WITH LDL/HDL RATIO
Cholesterol, Total: 137 mg/dL (ref 100–199)
HDL: 32 mg/dL — ABNORMAL LOW (ref >39)
LDL Chol Calc (NIH): 63 mg/dL (ref 0–99)
LDL/HDL Ratio: 2 ratio (ref 0.0–3.6)
Triglycerides: 259 mg/dL — ABNORMAL HIGH (ref 0–149)
VLDL Cholesterol Cal: 42 mg/dL — ABNORMAL HIGH (ref 5–40)

## 2020-09-22 LAB — TSH+FREE T4
Free T4: 0.94 ng/dL (ref 0.82–1.77)
TSH: 2.46 u[IU]/mL (ref 0.450–4.500)

## 2020-09-23 ENCOUNTER — Other Ambulatory Visit: Payer: Self-pay | Admitting: Hospice and Palliative Medicine

## 2020-09-23 LAB — UA/M W/RFLX CULTURE, ROUTINE
Bilirubin, UA: NEGATIVE
Glucose, UA: NEGATIVE
Leukocytes,UA: NEGATIVE
Nitrite, UA: NEGATIVE
Protein,UA: NEGATIVE
RBC, UA: NEGATIVE
Specific Gravity, UA: 1.023 (ref 1.005–1.030)
Urobilinogen, Ur: 0.2 mg/dL (ref 0.2–1.0)
pH, UA: 5 (ref 5.0–7.5)

## 2020-09-23 LAB — MICROSCOPIC EXAMINATION
Bacteria, UA: NONE SEEN
Casts: NONE SEEN /lpf
RBC, Urine: NONE SEEN /hpf (ref 0–2)
WBC, UA: NONE SEEN /hpf (ref 0–5)

## 2020-09-23 MED ORDER — ROSUVASTATIN CALCIUM 5 MG PO TABS: 5.0000 mg | ORAL_TABLET | Freq: Every day | ORAL | 3 refills | Status: DC

## 2020-09-23 NOTE — Progress Notes (Signed)
Please let Mr .Kehl know that I have reviewed his labs. His cholesterol levels continue to be elevated. I have sent in low dose Cretor--please emphasize he needs to take this medication every day to prevent risk of stroke or heart attack.

## 2020-09-28 ENCOUNTER — Telehealth: Payer: Self-pay

## 2020-09-28 NOTE — Telephone Encounter (Signed)
Called pt and left a message and advised of referral appointment. Jimmy Norton

## 2020-10-08 DIAGNOSIS — M1712 Unilateral primary osteoarthritis, left knee: Secondary | ICD-10-CM | POA: Diagnosis not present

## 2020-10-08 DIAGNOSIS — Z96651 Presence of right artificial knee joint: Secondary | ICD-10-CM | POA: Diagnosis not present

## 2020-10-08 DIAGNOSIS — M47812 Spondylosis without myelopathy or radiculopathy, cervical region: Secondary | ICD-10-CM | POA: Diagnosis not present

## 2020-10-21 ENCOUNTER — Other Ambulatory Visit: Payer: Self-pay

## 2020-10-21 ENCOUNTER — Ambulatory Visit (INDEPENDENT_AMBULATORY_CARE_PROVIDER_SITE_OTHER): Payer: Medicare HMO

## 2020-10-21 DIAGNOSIS — G4733 Obstructive sleep apnea (adult) (pediatric): Secondary | ICD-10-CM | POA: Diagnosis not present

## 2020-10-21 NOTE — Progress Notes (Signed)
95 percentile pressure 10.8   95th percentile leak 21.3   apnea index 0.5 /hr  apnea-hypopnea index  1.3 /hr   total days used  >4 hr 89 days  total days used <4 hr 1 days  Total compliance 99 percent  He is doing great on cpap but does need supplies but he will need new order. I will See Lovena Le about getting order. No other problems or questions

## 2020-10-27 ENCOUNTER — Telehealth: Payer: Self-pay

## 2020-10-27 NOTE — Telephone Encounter (Signed)
Called patient on 10/27/2020 to check on referral status to dermatology.

## 2020-10-29 DIAGNOSIS — B078 Other viral warts: Secondary | ICD-10-CM | POA: Diagnosis not present

## 2020-10-29 DIAGNOSIS — L821 Other seborrheic keratosis: Secondary | ICD-10-CM | POA: Diagnosis not present

## 2020-10-29 DIAGNOSIS — L578 Other skin changes due to chronic exposure to nonionizing radiation: Secondary | ICD-10-CM | POA: Diagnosis not present

## 2020-10-29 DIAGNOSIS — D225 Melanocytic nevi of trunk: Secondary | ICD-10-CM | POA: Diagnosis not present

## 2020-10-29 DIAGNOSIS — D2310 Other benign neoplasm of skin of unspecified eyelid, including canthus: Secondary | ICD-10-CM | POA: Diagnosis not present

## 2020-10-29 DIAGNOSIS — D3610 Benign neoplasm of peripheral nerves and autonomic nervous system, unspecified: Secondary | ICD-10-CM | POA: Diagnosis not present

## 2020-10-29 DIAGNOSIS — L720 Epidermal cyst: Secondary | ICD-10-CM | POA: Diagnosis not present

## 2020-10-29 DIAGNOSIS — L738 Other specified follicular disorders: Secondary | ICD-10-CM | POA: Diagnosis not present

## 2020-10-29 DIAGNOSIS — L570 Actinic keratosis: Secondary | ICD-10-CM | POA: Diagnosis not present

## 2020-11-04 ENCOUNTER — Other Ambulatory Visit: Payer: Self-pay | Admitting: Hospice and Palliative Medicine

## 2020-11-04 DIAGNOSIS — G4733 Obstructive sleep apnea (adult) (pediatric): Secondary | ICD-10-CM | POA: Diagnosis not present

## 2020-11-04 NOTE — Addendum Note (Signed)
Addended by: Luiz Ochoa on: 11/04/2020 12:13 PM   Modules accepted: Orders

## 2020-11-06 DIAGNOSIS — G4733 Obstructive sleep apnea (adult) (pediatric): Secondary | ICD-10-CM | POA: Diagnosis not present

## 2020-11-18 ENCOUNTER — Telehealth: Payer: Self-pay

## 2020-11-18 NOTE — Telephone Encounter (Signed)
Verbal order for cpap supplies signed by provider and placed in [facility] Patient folder. Loma Sousa

## 2020-12-06 DIAGNOSIS — G4733 Obstructive sleep apnea (adult) (pediatric): Secondary | ICD-10-CM | POA: Diagnosis not present

## 2020-12-15 ENCOUNTER — Ambulatory Visit (INDEPENDENT_AMBULATORY_CARE_PROVIDER_SITE_OTHER): Payer: Medicare HMO | Admitting: Nurse Practitioner

## 2020-12-15 ENCOUNTER — Encounter: Payer: Self-pay | Admitting: Nurse Practitioner

## 2020-12-15 ENCOUNTER — Other Ambulatory Visit: Payer: Self-pay

## 2020-12-15 VITALS — BP 148/82 | HR 72 | Temp 98.6°F | Resp 16 | Ht 69.0 in | Wt 302.0 lb

## 2020-12-15 DIAGNOSIS — Z6841 Body Mass Index (BMI) 40.0 and over, adult: Secondary | ICD-10-CM | POA: Diagnosis not present

## 2020-12-15 DIAGNOSIS — Z9989 Dependence on other enabling machines and devices: Secondary | ICD-10-CM

## 2020-12-15 DIAGNOSIS — G4733 Obstructive sleep apnea (adult) (pediatric): Secondary | ICD-10-CM

## 2020-12-15 DIAGNOSIS — E782 Mixed hyperlipidemia: Secondary | ICD-10-CM

## 2020-12-15 DIAGNOSIS — Z9114 Patient's other noncompliance with medication regimen: Secondary | ICD-10-CM

## 2020-12-15 DIAGNOSIS — Z91148 Patient's other noncompliance with medication regimen for other reason: Secondary | ICD-10-CM | POA: Insufficient documentation

## 2020-12-15 DIAGNOSIS — I10 Essential (primary) hypertension: Secondary | ICD-10-CM | POA: Diagnosis not present

## 2020-12-15 DIAGNOSIS — I700 Atherosclerosis of aorta: Secondary | ICD-10-CM | POA: Diagnosis not present

## 2020-12-15 DIAGNOSIS — F331 Major depressive disorder, recurrent, moderate: Secondary | ICD-10-CM

## 2020-12-15 DIAGNOSIS — G8929 Other chronic pain: Secondary | ICD-10-CM | POA: Diagnosis not present

## 2020-12-15 DIAGNOSIS — M25562 Pain in left knee: Secondary | ICD-10-CM | POA: Diagnosis not present

## 2020-12-15 DIAGNOSIS — R69 Illness, unspecified: Secondary | ICD-10-CM | POA: Diagnosis not present

## 2020-12-15 MED ORDER — LOSARTAN POTASSIUM 25 MG PO TABS: 25.0000 mg | ORAL_TABLET | Freq: Every day | ORAL | 3 refills | Status: DC

## 2020-12-15 NOTE — Progress Notes (Addendum)
[facility] Jimmy Norton, [place] [address]  Internal MEDICINE  Office Visit Note  Patient Name: Jimmy Norton  [id number]  [id number]  Date of Service: 12/15/2020  Chief Complaint  Patient presents with   Follow-up    Patient has not been taking any of his medication, discuss knee replacement    HPI [name] presents for follow up visit to discuss knee replacement and medications.He lives at home with his wife of 66 years. His wife works 2 part-time jobs and is out of the house from [place] to [employer]. He is retired and states that his routine has changed since he retired. He does not always wake up at the same time, he may not always take a shower in the morning or eat breakfast. He often forgets to take his medications and states "I'm a bad patient, its my attitude. I just don't really care".  Discussed several solutions and methods to ensure he remembers to take his medications every day. He was resistive throughout the conversation.  -discussed knee replacement. He wants to defer surgery until absolute last resort. He states "I get up and do what I do" he states it does not bother him very much now. He was a Administrator for 25 years. He does have some arthritis and back pain especially in his neck with radiation down the right arm with numbness but he states it does not bother him enough to have it looked at. He states that he will get things looked at when they bother him enough.   He is considering switching over PCP to [facility]. He reports that he is not happy with the [facility] He is seeing the [place] for cardiology as well as neurology. Due to some episodes of syncope- he was evaluated with a holter monitor and had a CT of the head. He has an upcoming virtual appointment with his neurologist that he is skeptical about. He states "how do you see a patient for neurology over video?"  He is somewhat of a poor historian and is unable to explain the results of the testing he has done so far  and what the plans are for next steps   He says he does not like doctors, feels that this testing is not going to show anything. He does not take his medications as prescribed, misses several doses of all medications. He continues to say overall he feels good, just a few aches and pains he says comes along with aging. He talks extensively about his life, his time in [place] war, struggles he has had since the war-overall he is happy with his life, continues to work hard everyday at home since he is retired. He has some things in his shop to work on and does things around the house.    Current Medication: Outpatient Encounter Medications as of 12/15/2020  Medication Sig   cholecalciferol (VITAMIN D) 400 UNITS TABS tablet Take 400 Units by mouth daily.   Cyanocobalamin (B-12) 2500 MCG TABS Take 1 tablet by mouth daily.   FLUoxetine (PROZAC) 20 MG capsule Take 1 capsule (20 mg total) by mouth daily.   glucosamine-chondroitin 500-400 MG tablet Take 1 tablet by mouth 2 (two) times daily.   losartan (COZAAR) 25 MG tablet Take 1 tablet (25 mg total) by mouth daily.   meloxicam (MOBIC) 15 MG tablet Take 1 tablet (15 mg total) by mouth daily.   Misc Natural Products (OSTEO BI-FLEX/5-LOXIN ADVANCED) TABS Take by mouth daily.  Misc. Devices MISC cpap   Multiple Vitamins-Minerals (MULTIVITAMIN WITH MINERALS) tablet Take 1 tablet by mouth daily. Reported on 09/11/2015   Probiotic Product (PROBIOTIC-10 PO) Take by mouth.   Pyridoxine HCl (B-6) 100 MG TABS Take by mouth.   rosuvastatin (CRESTOR) 5 MG tablet Take 1 tablet (5 mg total) by mouth daily.   terazosin (HYTRIN) 10 MG capsule Take 1 capsule (10 mg total) by mouth at bedtime.   tiZANidine (ZANAFLEX) 4 MG tablet Take 1 tablet (4 mg total) by mouth at bedtime as needed for muscle spasms.   triamcinolone (KENALOG) 0.025 % ointment Apply 1 application topically 2 (two) times daily.   [DISCONTINUED] losartan (COZAAR) 25 MG tablet Take 1 tablet (25 mg total)  by mouth daily.   No facility-administered encounter medications on file as of 12/15/2020.    Surgical History: Past Surgical History:  Procedure Laterality Date   HAND SURGERY Bilateral    HERNIA REPAIR     x 2   NOSE SURGERY     REPLACEMENT TOTAL KNEE      Medical History: Past Medical History:  Diagnosis Date   Anxiety and depression    Frequency    Gallstones 03/05/2013   Hypertension    Kidney problem   May  2013   Mixed hyperlipidemia    OAB (overactive bladder)    Obstructive sleep apnea (adult) (pediatric)     Family History: Family History  Problem Relation Age of Onset   COPD Mother    Heart disease Mother    Cancer Father        Brain tumor   Lung cancer Paternal Uncle    Cancer - Other Maternal Grandmother        Back   Kidney disease Neg Hx    Prostate cancer Neg Hx     Social History   Socioeconomic History   Marital status: Married    Spouse name: Not on file   Number of children: Not on file   Years of education: Not on file   Highest education level: Not on file  Occupational History   Not on file  Tobacco Use   Smoking status: Current Some Day Smoker    Types: Cigars   Smokeless tobacco: Current User    Types: Chew   Tobacco comment: smokes cigars onces in a while  Vaping Use   Vaping Use: Never used  Substance and Sexual Activity   Alcohol use: No    Alcohol/week: 0.0 standard drinks   Drug use: No   Sexual activity: Not on file  Other Topics Concern   Not on file  Social History Narrative   Not on file   Social Determinants of Health   Financial Resource Strain: Not on file  Food Insecurity: Not on file  Transportation Needs: Not on file  Physical Activity: Not on file  Stress: Not on file  Social Connections: Not on file  Intimate Partner Violence: Not on file      Review of Systems  Constitutional: Negative for chills, fatigue and unexpected weight change.  HENT: Negative for congestion, rhinorrhea, sneezing and  sore throat.   Eyes: Negative for redness.  Respiratory: Negative for cough, chest tightness and shortness of breath.   Cardiovascular: Negative for chest pain and palpitations.  Gastrointestinal: Negative for abdominal pain, constipation, diarrhea, nausea and vomiting.  Genitourinary: Negative for dysuria and frequency.  Musculoskeletal: Negative for arthralgias, back pain, joint swelling and neck pain.  Skin: Negative for rash.  Neurological:  Negative.  Negative for tremors and numbness.  Hematological: Negative for adenopathy. Does not bruise/bleed easily.  Psychiatric/Behavioral: Negative for behavioral problems (Depression), sleep disturbance and suicidal ideas. The patient is not nervous/anxious.     Vital Signs: BP (!) 148/82   Pulse 72   Temp 98.6 F (37 C)   Resp 16   Ht 5\' 9"  (1.753 m)   Wt (!) 302 lb (137 kg)   SpO2 93%   BMI 44.60 kg/m    Physical Exam Vitals reviewed.  Constitutional:      General: He is not in acute distress.    Appearance: He is well-developed. He is morbidly obese. He is not ill-appearing or diaphoretic.  HENT:     Head: Normocephalic and atraumatic.     Mouth/Throat:     Pharynx: No oropharyngeal exudate.  Neck:     Thyroid: No thyromegaly.     Vascular: No JVD.     Trachea: No tracheal deviation.  Cardiovascular:     Rate and Rhythm: Normal rate and regular rhythm.     Pulses: Normal pulses.     Heart sounds: Normal heart sounds. No murmur heard. No friction rub. No gallop.   Pulmonary:     Effort: Pulmonary effort is normal. No respiratory distress.     Breath sounds: Normal breath sounds. No wheezing or rales.  Chest:     Chest wall: No tenderness.  Musculoskeletal:     Cervical back: Normal range of motion and neck supple.  Lymphadenopathy:     Cervical: No cervical adenopathy.  Skin:    General: Skin is warm and dry.     Capillary Refill: Capillary refill takes less than 2 seconds.  Neurological:     Mental Status: He is  alert and oriented to person, place, and time.     Cranial Nerves: No cranial nerve deficit.  Psychiatric:        Mood and Affect: Mood normal.        Behavior: Behavior normal. Behavior is cooperative.        Thought Content: Thought content normal.        Judgment: Judgment normal.    Assessment/Plan:  1. Noncompliance with medication regimen Emphasized importance of taking all prescribed medications Long discussion about importance of caring for himself to the best of his ability to help his wife  2. Chronic pain of left knee Increased pain since having a fall at home. Declines x-ray or further evaluation. Advised him to rest, ice, and elevate the left knee when possible. Plans to see orthopedic provider at [facility] for further evaluation and treatment.   3. Essential (primary) hypertension BP and HR stable today, encouraged to adhere to his daily medications, continue with routine follow-up. - losartan (COZAAR) 25 MG tablet; Take 1 tablet (25 mg total) by mouth daily.  Dispense: 30 tablet; Refill: 3  4. OSA on CPAP Continue regular visits with Dr. Devona Konig for CPAP management.  5. Mixed hyperlipidemia Lipid panel from February 2022. He is prescribed rosuvastatin but has only taken it once of twice. Medication adherence discussed with patient. Will follow up in 2 months to discuss medication adherence and assess for improvement. When he is taking the medication consistently, will recheck lipid panel at a future date.  Lipid Panel     Component Value Date/Time   CHOL 137 09/21/2020 1123   TRIG 259 (H) 09/21/2020 1123   HDL 32 (L) 09/21/2020 1123   CHOLHDL 3.9 07/30/2018 1006   LDLCALC  63 09/21/2020 1123   LABVLDL 42 (H) 09/21/2020 1123   6. Major depressive disorder, recurrent, moderate (HCC) Continue medications as prescribed per [facility] provider. He states he feels ok. He reports his wife states she likes him better when he is taking his medication for depression.   7. Morbid  obesity with body mass index (BMI) of 40.0 to 44.9 in adult [facility]) Discussed the importance of weight loss on his overall health and well being, discussed small changes he can incorporate into his daily life that could assist with weight loss, modifying his diet as well as walking around his home each day for exercise.  8. Atherosclerosis of aorta ([place]) Pt is on crestor   General Counseling: Lemon verbalizes understanding of the findings of todays visit and agrees with plan of treatment. I have discussed any further diagnostic evaluation that may be needed or ordered today. We also reviewed his medications today. he has been encouraged to call the office with any questions or concerns that should arise related to todays visit.    No orders of the defined types were placed in this encounter.   Meds ordered this encounter  Medications   losartan (COZAAR) 25 MG tablet    Sig: Take 1 tablet (25 mg total) by mouth daily.    Dispense:  30 tablet    Refill:  3   Return in about 2 months (around 02/14/2021) for F/U to discuss medication adherence, Maidie Streight PCP.  Total time spent:30 Minutes Time spent includes review of chart, medications, test results, and follow up plan with the patient.   [place] Controlled Substance Database was reviewed by me.  This patient was seen by Jonetta Osgood, FNP-C in collaboration with Dr. Clayborn Bigness as a part of collaborative care agreement.   Dr Lavera Guise Internal medicine

## 2021-01-04 ENCOUNTER — Ambulatory Visit: Payer: Medicare HMO | Admitting: Internal Medicine

## 2021-01-04 ENCOUNTER — Other Ambulatory Visit: Payer: Self-pay

## 2021-01-04 ENCOUNTER — Encounter: Payer: Self-pay | Admitting: Internal Medicine

## 2021-01-04 VITALS — BP 130/70 | HR 86 | Temp 97.8°F | Resp 16 | Ht 71.0 in | Wt 304.0 lb

## 2021-01-04 DIAGNOSIS — Z9989 Dependence on other enabling machines and devices: Secondary | ICD-10-CM | POA: Diagnosis not present

## 2021-01-04 DIAGNOSIS — Z7189 Other specified counseling: Secondary | ICD-10-CM | POA: Diagnosis not present

## 2021-01-04 DIAGNOSIS — G4733 Obstructive sleep apnea (adult) (pediatric): Secondary | ICD-10-CM | POA: Diagnosis not present

## 2021-01-04 DIAGNOSIS — K219 Gastro-esophageal reflux disease without esophagitis: Secondary | ICD-10-CM | POA: Diagnosis not present

## 2021-01-04 NOTE — Progress Notes (Signed)
[facility] [address], [place] [address]  Pulmonary Sleep Medicine   Office Visit Note  Patient Name: Jimmy Norton. DOB: 12-Mar-1946 MRN [id number]  Date of Service: 01/04/2021  Complaints/HPI: OSA. Patient is doing well with the CPAP device. Patient just recently had new equipment. New oxygen machine is louder. He still has issues with the weight. Also has depression and has been on prozac. He does not have a desire to really move around socialize. Has some issures with getting motivated to work. His wife still works and he states that is also not helpful to be alone  ROS  General: (-) fever, (-) chills, (-) night sweats, (-) weakness Skin: (-) rashes, (-) itching,. Eyes: (-) visual changes, (-) redness, (-) itching. Nose and Sinuses: (-) nasal stuffiness or itchiness, (-) postnasal drip, (-) nosebleeds, (-) sinus trouble. Mouth and Throat: (-) sore throat, (-) hoarseness. Neck: (-) swollen glands, (-) enlarged thyroid, (-) neck pain. Respiratory: - cough, (-) bloody sputum, + shortness of breath, - wheezing. Cardiovascular: - ankle swelling, (-) chest pain. Lymphatic: (-) lymph node enlargement. Neurologic: (-) numbness, (-) tingling. Psychiatric: (-) anxiety, (-) depression   Current Medication: Outpatient Encounter Medications as of 01/04/2021  Medication Sig   cholecalciferol (VITAMIN D) 400 UNITS TABS tablet Take 400 Units by mouth daily.   Cyanocobalamin (B-12) 2500 MCG TABS Take 1 tablet by mouth daily.   FLUoxetine (PROZAC) 20 MG capsule Take 1 capsule (20 mg total) by mouth daily.   glucosamine-chondroitin 500-400 MG tablet Take 1 tablet by mouth 2 (two) times daily.   losartan (COZAAR) 25 MG tablet Take 1 tablet (25 mg total) by mouth daily.   meloxicam (MOBIC) 15 MG tablet Take 1 tablet (15 mg total) by mouth daily.   Misc Natural Products (OSTEO BI-FLEX/5-LOXIN ADVANCED) TABS Take by mouth daily.   Misc. Devices MISC cpap   Multiple  Vitamins-Minerals (MULTIVITAMIN WITH MINERALS) tablet Take 1 tablet by mouth daily. Reported on 09/11/2015   Probiotic Product (PROBIOTIC-10 PO) Take by mouth.   Pyridoxine HCl (B-6) 100 MG TABS Take by mouth.   rosuvastatin (CRESTOR) 5 MG tablet Take 1 tablet (5 mg total) by mouth daily.   terazosin (HYTRIN) 10 MG capsule Take 1 capsule (10 mg total) by mouth at bedtime.   tiZANidine (ZANAFLEX) 4 MG tablet Take 1 tablet (4 mg total) by mouth at bedtime as needed for muscle spasms.   triamcinolone (KENALOG) 0.025 % ointment Apply 1 application topically 2 (two) times daily.   No facility-administered encounter medications on file as of 01/04/2021.    Surgical History: Past Surgical History:  Procedure Laterality Date   HAND SURGERY Bilateral    HERNIA REPAIR     x 2   NOSE SURGERY     REPLACEMENT TOTAL KNEE      Medical History: Past Medical History:  Diagnosis Date   Anxiety and depression    Frequency    Gallstones 03/05/2013   Hypertension    Kidney problem   May  2013   Mixed hyperlipidemia    OAB (overactive bladder)    Obstructive sleep apnea (adult) (pediatric)     Family History: Family History  Problem Relation Age of Onset   COPD Mother    Heart disease Mother    Cancer Father        Brain tumor   Lung cancer Paternal Uncle    Cancer - Other Maternal Grandmother        Back  Kidney disease Neg Hx    Prostate cancer Neg Hx     Social History: Social History   Socioeconomic History   Marital status: Married    Spouse name: Not on file   Number of children: Not on file   Years of education: Not on file   Highest education level: Not on file  Occupational History   Not on file  Tobacco Use   Smoking status: Some Days    Pack years: 0.00    Types: Cigars   Smokeless tobacco: Current    Types: Chew   Tobacco comments:    smokes cigars onces in a while  Vaping Use   Vaping Use: Never used  Substance and Sexual Activity   Alcohol use: No     Alcohol/week: 0.0 standard drinks   Drug use: No   Sexual activity: Not on file  Other Topics Concern   Not on file  Social History Narrative   Not on file   Social Determinants of Health   Financial Resource Strain: Not on file  Food Insecurity: Not on file  Transportation Needs: Not on file  Physical Activity: Not on file  Stress: Not on file  Social Connections: Not on file  Intimate Partner Violence: Not on file    Vital Signs: Blood pressure 130/70, pulse 86, temperature 97.8 F (36.6 C), resp. rate 16, height 5\' 11"  (1.803 m), weight (!) 304 lb (137.9 kg), SpO2 95 %.  Examination: General Appearance: The patient is well-developed, well-nourished, and in no distress. Skin: Gross inspection of skin unremarkable. Head: normocephalic, no gross deformities. Eyes: no gross deformities noted. ENT: ears appear grossly normal no exudates. Neck: Supple. No thyromegaly. No LAD. Respiratory: no rhonchi note. Cardiovascular: Normal S1 and S2 without murmur or rub. Extremities: No cyanosis. pulses are equal. Neurologic: Alert and oriented. No involuntary movements.  LABS: No results found for this or any previous visit (from the past 2160 hour(s)).  Radiology: No results found.  No results found.  No results found.    Assessment and Plan: Patient Active Problem List   Diagnosis Date Noted   Noncompliance with medication regimen 12/15/2020   Syncope 04/15/2020   Abrasion of left forearm 04/15/2020   Abrasion of right lower leg 04/15/2020   Laceration of left forearm without complication 94/50/3888   Encounter for general adult medical examination with abnormal findings 08/27/2019   Left knee pain 08/27/2019   Cervical stenosis of spinal canal 11/06/2018   Screening for prostate cancer 07/30/2018   Dysuria 07/30/2018   Arthritis of carpometacarpal ([place]) joint of right thumb 07/02/2018   Primary generalized (osteo)arthritis 06/10/2018   Cervical disc disease with  myelopathy 06/10/2018   Anxiety 06/08/2018   Depression 06/08/2018   BPH (benign prostatic hyperplasia) 06/08/2018   Neuropathy 06/08/2018   Morbid obesity with body mass index (BMI) of 40.0 to 44.9 in adult ([place]) 11/30/2017   Tick bite 11/27/2017   Plantar fasciitis 11/27/2017   Primary osteoarthritis of right elbow 11/27/2017   OSA on CPAP 08/24/2017   Mixed hyperlipidemia 07/28/2017   Allergic rhinitis due to pollen 07/28/2017   Hypoxemia 07/28/2017   Calculus of kidney 07/28/2017   Chest pain, unspecified 07/28/2017   Major depressive disorder, recurrent, moderate ([place]) 07/28/2017   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 OSA on CPAP Doing well has residual EDS but I do not think a good candidate for stimulate therapy due to potential side effects. This was discussed in detail with him  2. CPAP use counseling CPAP Counseling: had a lengthy discussion with the patient regarding the importance of PAP therapy in management of the sleep apnea. Patient appears to understand the risk factor reduction and also understands the risks associated with untreated sleep apnea. Patient will try to make a good faith effort to remain compliant with therapy. Also instructed the patient on proper cleaning of the device including the water must be changed daily if possible and use of distilled water is preferred. Patient understands that the machine should be regularly cleaned with appropriate recommended cleaning solutions that do not damage the PAP machine for example given white vinegar and water rinses. Other methods such as ozone treatment may not be as good as these simple methods to achieve cleaning.   3. GERD  without esophagitis Medical management PPI as tolerated  4. Obesity, morbid ([place]) Obesity Counseling: Had a lengthy discussion regarding patients BMI and weight issues. Patient was instructed on portion control as well as increased activity. Also discussed caloric restrictions with trying to maintain intake less than 2000 Kcal. Discussions were made in accordance with the 5As of weight management. Simple actions such as not eating late and if able to, taking a walk is suggested.    General Counseling: I have discussed the findings of the evaluation and examination with Matthias.  I have also discussed any further diagnostic evaluation thatmay be needed or ordered today. Zayde verbalizes understanding of the findings of todays visit. We also reviewed his medications today and discussed drug interactions and side effects including but not limited excessive drowsiness and altered mental states. We also discussed that there is always a risk not just to him but also people around him. he has been encouraged to call the office with any questions or concerns that should arise related to todays visit.  No orders of the defined types were placed in this encounter.    Time spent: 56  I have personally obtained a history, examined the patient, evaluated laboratory and imaging results, formulated the assessment and plan and placed orders.    Allyne Gee, MD [facility] Pulmonary and Critical Care Sleep medicine

## 2021-01-04 NOTE — Patient Instructions (Signed)

## 2021-01-06 DIAGNOSIS — G4733 Obstructive sleep apnea (adult) (pediatric): Secondary | ICD-10-CM | POA: Diagnosis not present

## 2021-01-07 ENCOUNTER — Telehealth: Payer: Self-pay

## 2021-01-07 NOTE — Telephone Encounter (Signed)
Verbal order for medical home equipment signed by provider and placed in [facility] Patient folder. Loma Sousa

## 2021-01-29 DIAGNOSIS — M25552 Pain in left hip: Secondary | ICD-10-CM | POA: Diagnosis not present

## 2021-01-29 DIAGNOSIS — M545 Low back pain, unspecified: Secondary | ICD-10-CM | POA: Diagnosis not present

## 2021-01-29 DIAGNOSIS — M5442 Lumbago with sciatica, left side: Secondary | ICD-10-CM | POA: Diagnosis not present

## 2021-02-05 DIAGNOSIS — G4733 Obstructive sleep apnea (adult) (pediatric): Secondary | ICD-10-CM | POA: Diagnosis not present

## 2021-02-12 ENCOUNTER — Telehealth: Payer: Self-pay

## 2021-02-12 NOTE — Telephone Encounter (Signed)
Left vm to screen for 02/15/21 appointment-Toni

## 2021-02-15 ENCOUNTER — Encounter: Payer: Self-pay | Admitting: Nurse Practitioner

## 2021-02-15 ENCOUNTER — Ambulatory Visit (INDEPENDENT_AMBULATORY_CARE_PROVIDER_SITE_OTHER): Payer: Medicare HMO | Admitting: Nurse Practitioner

## 2021-02-15 ENCOUNTER — Other Ambulatory Visit: Payer: Self-pay

## 2021-02-15 VITALS — BP 148/62 | HR 98 | Temp 97.5°F | Resp 16 | Ht 69.0 in | Wt 301.2 lb

## 2021-02-15 DIAGNOSIS — K219 Gastro-esophageal reflux disease without esophagitis: Secondary | ICD-10-CM | POA: Diagnosis not present

## 2021-02-15 DIAGNOSIS — M25562 Pain in left knee: Secondary | ICD-10-CM

## 2021-02-15 DIAGNOSIS — G8929 Other chronic pain: Secondary | ICD-10-CM

## 2021-02-15 DIAGNOSIS — Z9989 Dependence on other enabling machines and devices: Secondary | ICD-10-CM | POA: Diagnosis not present

## 2021-02-15 DIAGNOSIS — Z6841 Body Mass Index (BMI) 40.0 and over, adult: Secondary | ICD-10-CM | POA: Diagnosis not present

## 2021-02-15 DIAGNOSIS — G4733 Obstructive sleep apnea (adult) (pediatric): Secondary | ICD-10-CM | POA: Diagnosis not present

## 2021-02-15 DIAGNOSIS — M150 Primary generalized (osteo)arthritis: Secondary | ICD-10-CM | POA: Diagnosis not present

## 2021-02-15 DIAGNOSIS — Z9119 Patient's noncompliance with other medical treatment and regimen: Secondary | ICD-10-CM | POA: Diagnosis not present

## 2021-02-15 DIAGNOSIS — I10 Essential (primary) hypertension: Secondary | ICD-10-CM | POA: Diagnosis not present

## 2021-02-15 DIAGNOSIS — Z91199 Patient's noncompliance with other medical treatment and regimen due to unspecified reason: Secondary | ICD-10-CM
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
## 2021-02-16 DIAGNOSIS — M5416 Radiculopathy, lumbar region: Secondary | ICD-10-CM | POA: Diagnosis not present

## 2021-02-16 DIAGNOSIS — M5136 Other intervertebral disc degeneration, lumbar region: Secondary | ICD-10-CM | POA: Diagnosis not present

## 2021-02-16 DIAGNOSIS — M47816 Spondylosis without myelopathy or radiculopathy, lumbar region: Secondary | ICD-10-CM | POA: Diagnosis not present

## 2021-03-01 ENCOUNTER — Other Ambulatory Visit: Payer: Self-pay | Admitting: Physical Medicine and Rehabilitation

## 2021-03-01 DIAGNOSIS — M5416 Radiculopathy, lumbar region: Secondary | ICD-10-CM

## 2021-03-08 DIAGNOSIS — G4733 Obstructive sleep apnea (adult) (pediatric): Secondary | ICD-10-CM | POA: Diagnosis not present

## 2021-03-10 ENCOUNTER — Other Ambulatory Visit: Payer: Self-pay

## 2021-03-10 ENCOUNTER — Ambulatory Visit
Admission: RE | Admit: 2021-03-10 | Discharge: 2021-03-10 | Disposition: A | Discharge status: Home / Self Care | Payer: No Typology Code available for payment source | Source: Ambulatory Visit | Attending: Physical Medicine and Rehabilitation | Admitting: Physical Medicine and Rehabilitation

## 2021-03-10 DIAGNOSIS — M48061 Spinal stenosis, lumbar region without neurogenic claudication: Secondary | ICD-10-CM | POA: Diagnosis not present

## 2021-03-10 DIAGNOSIS — M5416 Radiculopathy, lumbar region: Secondary | ICD-10-CM

## 2021-03-10 DIAGNOSIS — M545 Low back pain, unspecified: Secondary | ICD-10-CM | POA: Diagnosis not present

## 2021-03-15 ENCOUNTER — Other Ambulatory Visit: Payer: Self-pay | Admitting: Family Medicine

## 2021-03-15 ENCOUNTER — Other Ambulatory Visit: Payer: Self-pay

## 2021-03-15 ENCOUNTER — Ambulatory Visit
Admission: RE | Admit: 2021-03-15 | Discharge: 2021-03-15 | Disposition: A | Discharge status: Home / Self Care | Payer: Medicare HMO | Source: Ambulatory Visit | Attending: Family Medicine | Admitting: Family Medicine

## 2021-03-15 DIAGNOSIS — M5416 Radiculopathy, lumbar region: Secondary | ICD-10-CM | POA: Diagnosis not present

## 2021-03-15 DIAGNOSIS — S069X9A Unspecified intracranial injury with loss of consciousness of unspecified duration, initial encounter: Secondary | ICD-10-CM | POA: Diagnosis not present

## 2021-03-15 DIAGNOSIS — S0003XA Contusion of scalp, initial encounter: Secondary | ICD-10-CM | POA: Diagnosis not present

## 2021-03-15 DIAGNOSIS — S0990XA Unspecified injury of head, initial encounter: Secondary | ICD-10-CM | POA: Diagnosis not present

## 2021-03-15 DIAGNOSIS — M5136 Other intervertebral disc degeneration, lumbar region: Secondary | ICD-10-CM | POA: Diagnosis not present

## 2021-03-17 DIAGNOSIS — M6281 Muscle weakness (generalized): Secondary | ICD-10-CM | POA: Diagnosis not present

## 2021-03-17 DIAGNOSIS — M5416 Radiculopathy, lumbar region: Secondary | ICD-10-CM | POA: Diagnosis not present

## 2021-03-17 DIAGNOSIS — M542 Cervicalgia: Secondary | ICD-10-CM | POA: Diagnosis not present

## 2021-03-24 DIAGNOSIS — M542 Cervicalgia: Secondary | ICD-10-CM | POA: Diagnosis not present

## 2021-03-24 DIAGNOSIS — M5416 Radiculopathy, lumbar region: Secondary | ICD-10-CM | POA: Diagnosis not present

## 2021-03-24 DIAGNOSIS — M6281 Muscle weakness (generalized): Secondary | ICD-10-CM | POA: Diagnosis not present

## 2021-03-31 DIAGNOSIS — M542 Cervicalgia: Secondary | ICD-10-CM | POA: Diagnosis not present

## 2021-04-05 DIAGNOSIS — M5412 Radiculopathy, cervical region: Secondary | ICD-10-CM | POA: Diagnosis not present

## 2021-04-05 DIAGNOSIS — M5416 Radiculopathy, lumbar region: Secondary | ICD-10-CM | POA: Diagnosis not present

## 2021-04-05 DIAGNOSIS — M5030 Other cervical disc degeneration, unspecified cervical region: Secondary | ICD-10-CM | POA: Diagnosis not present

## 2021-04-05 DIAGNOSIS — M5136 Other intervertebral disc degeneration, lumbar region: Secondary | ICD-10-CM | POA: Diagnosis not present

## 2021-04-08 DIAGNOSIS — G4733 Obstructive sleep apnea (adult) (pediatric): Secondary | ICD-10-CM | POA: Diagnosis not present

## 2021-04-14 DIAGNOSIS — M5412 Radiculopathy, cervical region: Secondary | ICD-10-CM | POA: Diagnosis not present

## 2021-04-14 DIAGNOSIS — M5020 Other cervical disc displacement, unspecified cervical region: Secondary | ICD-10-CM | POA: Diagnosis not present

## 2021-04-19 ENCOUNTER — Ambulatory Visit (INDEPENDENT_AMBULATORY_CARE_PROVIDER_SITE_OTHER): Payer: Medicare HMO | Admitting: Nurse Practitioner

## 2021-04-19 ENCOUNTER — Telehealth: Payer: Self-pay | Admitting: Internal Medicine

## 2021-04-19 ENCOUNTER — Encounter: Payer: Self-pay | Admitting: Nurse Practitioner

## 2021-04-19 ENCOUNTER — Other Ambulatory Visit: Payer: Self-pay

## 2021-04-19 VITALS — BP 130/72 | HR 75 | Temp 98.1°F | Resp 16 | Ht 69.0 in | Wt 303.2 lb

## 2021-04-19 DIAGNOSIS — F331 Major depressive disorder, recurrent, moderate: Secondary | ICD-10-CM

## 2021-04-19 DIAGNOSIS — Z1212 Encounter for screening for malignant neoplasm of rectum: Secondary | ICD-10-CM

## 2021-04-19 DIAGNOSIS — I700 Atherosclerosis of aorta: Secondary | ICD-10-CM | POA: Diagnosis not present

## 2021-04-19 DIAGNOSIS — Z1211 Encounter for screening for malignant neoplasm of colon: Secondary | ICD-10-CM | POA: Diagnosis not present

## 2021-04-19 DIAGNOSIS — N401 Enlarged prostate with lower urinary tract symptoms: Secondary | ICD-10-CM

## 2021-04-19 DIAGNOSIS — Z23 Encounter for immunization: Secondary | ICD-10-CM | POA: Diagnosis not present

## 2021-04-19 DIAGNOSIS — M150 Primary generalized (osteo)arthritis: Secondary | ICD-10-CM

## 2021-04-19 DIAGNOSIS — R69 Illness, unspecified: Secondary | ICD-10-CM | POA: Diagnosis not present

## 2021-04-19 MED ORDER — TERAZOSIN HCL 10 MG PO CAPS: 10.0000 mg | ORAL_CAPSULE | Freq: Every day | ORAL | 3 refills | Status: DC

## 2021-04-19 MED ORDER — MELOXICAM 15 MG PO TABS: 15.0000 mg | ORAL_TABLET | Freq: Every day | ORAL | 3 refills | Status: DC

## 2021-04-19 MED ORDER — FLUOXETINE HCL 20 MG PO CAPS: 20.0000 mg | ORAL_CAPSULE | Freq: Every day | ORAL | 3 refills | Status: DC

## 2021-04-19 MED ORDER — TIZANIDINE HCL 4 MG PO TABS: 4.0000 mg | ORAL_TABLET | Freq: Every evening | ORAL | 1 refills | Status: DC | PRN

## 2021-04-19 MED ORDER — ROSUVASTATIN CALCIUM 5 MG PO TABS: 5.0000 mg | ORAL_TABLET | Freq: Every day | ORAL | 3 refills | Status: DC

## 2021-04-19 MED ORDER — ZOSTER VAC RECOMB ADJUVANTED 50 MCG/0.5ML IM SUSR: 0.5000 mL | Freq: Once | INTRAMUSCULAR | 0 refills | Status: AC

## 2021-04-19 NOTE — Chronic Care Management (AMB) (Signed)
  Chronic Care Management   Outreach Note  04/19/2021 Name: Jimmy Norton. MRN: [id number] DOB: [birth year]  Referred by: Lavera Guise, MD Reason for referral : No chief complaint on file.   An unsuccessful telephone outreach was attempted today. The patient was referred to the pharmacist for assistance with care management and care coordination.   Follow Up Plan:   Tatjana Dellinger Upstream Scheduler

## 2021-04-19 NOTE — Progress Notes (Signed)
[facility] [place], [place] [address]  Internal MEDICINE  Office Visit Note  Patient Name: Jimmy Norton  [id number]  [id number]  Date of Service: 04/19/2021  Chief Complaint  Patient presents with   Follow-up    Right hip pain, right hand sometimes goes numb feels it all the way to right foot, balance is off, right shoulder feels heavy    Depression   Hyperlipidemia   Hypertension   Anxiety   Weight Loss   Quality Metric Gaps    Colonoscopy     HPI Al presents for a follow-up visit for adherence to medication regimen.  At his previous office visit, Al had only taken his medication the day of his appointment in the past 2 months between visits.  Nonadherence to medication and treatment regimen was discussed thoroughly with the patient at his previous visit and he was informed that he would not be able to be seen at the clinic if he continued to ignore provider recommendations and instructions.  Today the patient blood pressure was stable and he reports that he takes his medications most days.  When asked how many times he forgets he reports that he may forget once or twice a week which is a significant improvement from previous office visit.  He has been having an issue with his back lately including numbness and tingling down his upper extremity on the right side after steroid injections for his back he plans to go to Dr. Saralyn Pilar office and see if he can be scheduled for an appointment.  Since he has retired, the patient reports he just feels like there is too much going on and he is confused and if he could he would move back to [place] and [place] the [place].  He reports he is not suicidal nor does he want to die he is just tired of dealing with everything and wants to live a simple life.     Current Medication: Outpatient Encounter Medications as of 04/19/2021  Medication Sig   cholecalciferol (VITAMIN D) 400 UNITS  TABS tablet Take 400 Units by mouth daily.   Cyanocobalamin (B-12) 2500 MCG TABS Take 1 tablet by mouth daily.   glucosamine-chondroitin 500-400 MG tablet Take 1 tablet by mouth 2 (two) times daily.   losartan (COZAAR) 25 MG tablet Take 1 tablet (25 mg total) by mouth daily.   Misc. Devices MISC cpap   Multiple Vitamins-Minerals (MULTIVITAMIN WITH MINERALS) tablet Take 1 tablet by mouth daily. Reported on 09/11/2015   Probiotic Product (PROBIOTIC-10 PO) Take by mouth.   Pyridoxine HCl (B-6) 100 MG TABS Take by mouth.   triamcinolone (KENALOG) 0.025 % ointment Apply 1 application topically 2 (two) times daily.   [DISCONTINUED] FLUoxetine (PROZAC) 20 MG capsule Take 1 capsule (20 mg total) by mouth daily.   [DISCONTINUED] meloxicam (MOBIC) 15 MG tablet Take 1 tablet (15 mg total) by mouth daily.   [DISCONTINUED] rosuvastatin (CRESTOR) 5 MG tablet Take 1 tablet (5 mg total) by mouth daily.   [DISCONTINUED] terazosin (HYTRIN) 10 MG capsule Take 1 capsule (10 mg total) by mouth at bedtime.   [DISCONTINUED] tiZANidine (ZANAFLEX) 4 MG tablet Take 1 tablet (4 mg total) by mouth at bedtime as needed for muscle spasms.   [DISCONTINUED] Zoster Vaccine Adjuvanted [facility]) injection Inject 0.5 mLs into the muscle once.   FLUoxetine (PROZAC) 20 MG capsule Take 1 capsule (20 mg total) by mouth daily.  meloxicam (MOBIC) 15 MG tablet Take 1 tablet (15 mg total) by mouth daily.   rosuvastatin (CRESTOR) 5 MG tablet Take 1 tablet (5 mg total) by mouth daily.   terazosin (HYTRIN) 10 MG capsule Take 1 capsule (10 mg total) by mouth at bedtime.   tiZANidine (ZANAFLEX) 4 MG tablet Take 1 tablet (4 mg total) by mouth at bedtime as needed for muscle spasms.   [EXPIRED] Zoster Vaccine Adjuvanted [facility]) injection Inject 0.5 mLs into the muscle once for 1 dose.   [DISCONTINUED] Misc Natural Products (OSTEO BI-FLEX/5-LOXIN ADVANCED) TABS Take by mouth daily. (Patient not taking: Reported on 04/19/2021)   No  facility-administered encounter medications on file as of 04/19/2021.    Surgical History: Past Surgical History:  Procedure Laterality Date   HAND SURGERY Bilateral    HERNIA REPAIR     x 2   NOSE SURGERY     REPLACEMENT TOTAL KNEE      Medical History: Past Medical History:  Diagnosis Date   Anxiety and depression    Frequency    Gallstones 03/05/2013   Hypertension    Kidney problem   May  2013   Mixed hyperlipidemia    OAB (overactive bladder)    Obstructive sleep apnea (adult) (pediatric)     Family History: Family History  Problem Relation Age of Onset   COPD Mother    Heart disease Mother    Cancer Father        Brain tumor   Lung cancer Paternal Uncle    Cancer - Other Maternal Grandmother        Back   Kidney disease Neg Hx    Prostate cancer Neg Hx     Social History   Socioeconomic History   Marital status: Married    Spouse name: Not on file   Number of children: Not on file   Years of education: Not on file   Highest education level: Not on file  Occupational History   Not on file  Tobacco Use   Smoking status: Some Days    Types: Cigars   Smokeless tobacco: Current    Types: Chew   Tobacco comments:    smokes cigars onces in a while  Vaping Use   Vaping Use: Never used  Substance and Sexual Activity   Alcohol use: No    Alcohol/week: 0.0 standard drinks   Drug use: No   Sexual activity: Not on file  Other Topics Concern   Not on file  Social History Narrative   Not on file   Social Determinants of Health   Financial Resource Strain: Not on file  Food Insecurity: Not on file  Transportation Needs: Not on file  Physical Activity: Not on file  Stress: Not on file  Social Connections: Not on file  Intimate Partner Violence: Not on file      Review of Systems  Constitutional:  Negative for chills, fatigue and unexpected weight change.  HENT:  Negative for congestion, rhinorrhea, sneezing and sore throat.   Eyes:  Negative for  redness.  Respiratory:  Negative for cough, chest tightness and shortness of breath.   Cardiovascular:  Negative for chest pain and palpitations.  Gastrointestinal:  Negative for abdominal pain, constipation, diarrhea, nausea and vomiting.  Genitourinary:  Negative for dysuria and frequency.  Musculoskeletal:  Positive for arthralgias, back pain, gait problem and joint swelling. Negative for neck pain.  Skin:  Negative for rash.  Neurological:  Negative for tremors and numbness.  Hematological:  Negative for adenopathy. Does not bruise/bleed easily.  Psychiatric/Behavioral:  Negative for behavioral problems (Depression), sleep disturbance and suicidal ideas. The patient is not nervous/anxious.    Vital Signs: BP 130/72   Pulse 75   Temp 98.1 F (36.7 C)   Resp 16   Ht 5\' 9"  (1.753 m)   Wt (!) 303 lb 3.2 oz (137.5 kg)   SpO2 97%   BMI 44.77 kg/m    Physical Exam Vitals reviewed.  Constitutional:      General: He is not in acute distress.    Appearance: Normal appearance. He is obese. He is not ill-appearing.  HENT:     Head: Normocephalic and atraumatic.  Eyes:     Extraocular Movements: Extraocular movements intact.     Pupils: Pupils are equal, round, and reactive to light.  Cardiovascular:     Rate and Rhythm: Normal rate and regular rhythm.  Pulmonary:     Effort: Pulmonary effort is normal. No respiratory distress.  Neurological:     Mental Status: He is alert and oriented to person, place, and time.     Cranial Nerves: No cranial nerve deficit.     Coordination: Coordination normal.     Gait: Gait normal.  Psychiatric:        Mood and Affect: Mood normal.        Behavior: Behavior normal.       Assessment/Plan: 1. Primary generalized (osteo)arthritis Right knee arthritis is severe, he has been told he needs a knee replacement. Tizanidine and meloxicam refilled. - tiZANidine (ZANAFLEX) 4 MG tablet; Take 1 tablet (4 mg total) by mouth at bedtime as needed for  muscle spasms.  Dispense: 30 tablet; Refill: 1 - meloxicam (MOBIC) 15 MG tablet; Take 1 tablet (15 mg total) by mouth daily.  Dispense: 30 tablet; Refill: 3  2. Benign prostatic hyperplasia with lower urinary tract symptoms, symptom details unspecified Stable, refill ordered - terazosin (HYTRIN) 10 MG capsule; Take 1 capsule (10 mg total) by mouth at bedtime.  Dispense: 30 capsule; Refill: 3  3. Major depressive disorder, recurrent, moderate (HCC) Stable, refill ordered - FLUoxetine (PROZAC) 20 MG capsule; Take 1 capsule (20 mg total) by mouth daily.  Dispense: 30 capsule; Refill: 3  4. Screening for colorectal cancer GI referral ordered for routine screening colonoscopy - Ambulatory referral to Gastroenterology  5. Atherosclerosis of aorta ([place]) On statin therapy.  - rosuvastatin (CRESTOR) 5 MG tablet; Take 1 tablet (5 mg total) by mouth daily.  Dispense: 90 tablet; Refill: 3  6. Need for shingles vaccine - Zoster Vaccine Adjuvanted [facility]) injection; Inject 0.5 mLs into the muscle once for 1 dose.  Dispense: 0.5 mL; Refill: 0   General Counseling: Naren verbalizes understanding of the findings of todays visit and agrees with plan of treatment. I have discussed any further diagnostic evaluation that may be needed or ordered today. We also reviewed his medications today. he has been encouraged to call the office with any questions or concerns that should arise related to todays visit.    Orders Placed This Encounter  Procedures   Ambulatory referral to Gastroenterology    Meds ordered this encounter  Medications   Zoster Vaccine Adjuvanted [facility]) injection    Sig: Inject 0.5 mLs into the muscle once for 1 dose.    Dispense:  0.5 mL    Refill:  0   FLUoxetine (PROZAC) 20 MG capsule    Sig: Take 1 capsule (20 mg total) by mouth daily.  Dispense:  30 capsule    Refill:  3    Please note increased dose .   tiZANidine (ZANAFLEX) 4 MG tablet    Sig: Take 1 tablet (4 mg  total) by mouth at bedtime as needed for muscle spasms.    Dispense:  30 tablet    Refill:  1   rosuvastatin (CRESTOR) 5 MG tablet    Sig: Take 1 tablet (5 mg total) by mouth daily.    Dispense:  90 tablet    Refill:  3   terazosin (HYTRIN) 10 MG capsule    Sig: Take 1 capsule (10 mg total) by mouth at bedtime.    Dispense:  30 capsule    Refill:  3   meloxicam (MOBIC) 15 MG tablet    Sig: Take 1 tablet (15 mg total) by mouth daily.    Dispense:  30 tablet    Refill:  3    Return in about 5 months (around 09/19/2021) for CPE, Teirra Carapia PCP.   Total time spent:20 Minutes Time spent includes review of chart, medications, test results, and follow up plan with the patient.   [place] Controlled Substance Database was reviewed by me.  This patient was seen by Jonetta Osgood, FNP-C in collaboration with Dr. Clayborn Bigness as a part of collaborative care agreement.   Jurnee Nakayama R. Valetta Fuller, MSN, FNP-C Internal medicine

## 2021-04-19 NOTE — Chronic Care Management (AMB) (Signed)
  Chronic Care Management   Note  04/19/2021 Name: Jimmy Norton. MRN: [id number] DOB: 10-05-1945  Jimmy A Haskell Rihn. is a 75 y.o. year old male who is a primary care patient of Lavera Guise, MD. I reached out to [place]. by phone today in response to a referral sent by [name]'s PCP, Lavera Guise, MD.   Jimmy Norton was given information about Chronic Care Management services today including:  CCM service includes personalized support from designated clinical staff supervised by his physician, including individualized plan of care and coordination with other care providers 24/7 contact phone numbers for assistance for urgent and routine care needs. Service will only be billed when office clinical staff spend 20 minutes or more in a month to coordinate care. Only one practitioner may furnish and bill the service in a calendar month. The patient may stop CCM services at any time (effective at the end of the month) by phone call to the office staff.   Jimmy Norton  verbally agreed to assistance and services provided by embedded care coordination/care management team today.  Follow up plan:   Tatjana Secretary/administrator

## 2021-04-22 ENCOUNTER — Telehealth: Payer: Self-pay

## 2021-04-22 NOTE — Telephone Encounter (Signed)
Patient is needing to schedule a colonoscopy screening referral is in the system. Called and left a message for call back

## 2021-04-22 NOTE — Telephone Encounter (Signed)
Pt. Wife returning call about colonoscopy

## 2021-04-23 ENCOUNTER — Telehealth: Payer: Self-pay

## 2021-04-23 NOTE — Telephone Encounter (Signed)
CALLED PATIENT NO ANSWER LEFT VOICEMAIL FOR A CALL BACK ? ?

## 2021-04-23 NOTE — Telephone Encounter (Signed)
I HAVE CALLED PATIENT TWICE TODAY ONCE EARLY THIS MORNING AND JUST NOW NO ANSWER

## 2021-04-26 ENCOUNTER — Other Ambulatory Visit: Payer: Self-pay

## 2021-04-26 ENCOUNTER — Telehealth: Payer: Self-pay

## 2021-04-26 DIAGNOSIS — Z1211 Encounter for screening for malignant neoplasm of colon: Secondary | ICD-10-CM

## 2021-04-26 MED ORDER — CLENPIQ 10-3.5-12 MG-GM -GM/160ML PO SOLN: 1.0000 | ORAL | 0 refills | Status: DC

## 2021-04-26 NOTE — Telephone Encounter (Signed)
Pt. Returning call to schedule colonoscopy. I let pt. Wife know to try and answer the phone when she see our number pop up because she was upset and I told her that we had been calling and leaving messages and if they do not answer when we call it will be back and forth. She said she works and may not answer again so I just wanted you to know that I let her know how it works.

## 2021-04-26 NOTE — Progress Notes (Signed)
CALLED PATIENT NO ANSWER LEFT VOICEMAIL LETTER SENT

## 2021-04-26 NOTE — Progress Notes (Signed)
Gastroenterology Pre-Procedure Review  Request Date: 05/11/2021 Requesting Physician: Dr. Vicente Males   PATIENT REVIEW QUESTIONS: The patient responded to the following health history questions as indicated:    1. Are you having any GI issues? no 2. Do you have a personal history of Polyps? no 3. Do you have a family history of Colon Cancer or Polyps? no 4. Diabetes Mellitus? no 5. Joint replacements in the past 12 months?no 6. Major health problems in the past 3 months?no 7. Any artificial heart valves, MVP, or defibrillator?no    MEDICATIONS & ALLERGIES:    Patient reports the following regarding taking any anticoagulation/antiplatelet therapy:   Plavix, Coumadin, Eliquis, Xarelto, Lovenox, Pradaxa, Brilinta, or Effient? no Aspirin? no  Patient confirms/reports the following medications:  Current Outpatient Medications  Medication Sig Dispense Refill   cholecalciferol (VITAMIN D) 400 UNITS TABS tablet Take 400 Units by mouth daily.     Cyanocobalamin (B-12) 2500 MCG TABS Take 1 tablet by mouth daily.     FLUoxetine (PROZAC) 20 MG capsule Take 1 capsule (20 mg total) by mouth daily. 30 capsule 3   glucosamine-chondroitin 500-400 MG tablet Take 1 tablet by mouth 2 (two) times daily.     losartan (COZAAR) 25 MG tablet Take 1 tablet (25 mg total) by mouth daily. 30 tablet 3   meloxicam (MOBIC) 15 MG tablet Take 1 tablet (15 mg total) by mouth daily. 30 tablet 3   Misc. Devices MISC cpap     Multiple Vitamins-Minerals (MULTIVITAMIN WITH MINERALS) tablet Take 1 tablet by mouth daily. Reported on 09/11/2015     Probiotic Product (PROBIOTIC-10 PO) Take by mouth.     Pyridoxine HCl (B-6) 100 MG TABS Take by mouth.     rosuvastatin (CRESTOR) 5 MG tablet Take 1 tablet (5 mg total) by mouth daily. 90 tablet 3   terazosin (HYTRIN) 10 MG capsule Take 1 capsule (10 mg total) by mouth at bedtime. 30 capsule 3   tiZANidine (ZANAFLEX) 4 MG tablet Take 1 tablet (4 mg total) by mouth at bedtime as needed  for muscle spasms. 30 tablet 1   triamcinolone (KENALOG) 0.025 % ointment Apply 1 application topically 2 (two) times daily. 80 g 3   No current facility-administered medications for this visit.    Patient confirms/reports the following allergies:  Allergies  Allergen Reactions   Bee Venom Anaphylaxis   Penicillins Rash    No orders of the defined types were placed in this encounter.   AUTHORIZATION INFORMATION Primary Insurance: 1D#: Group #:  Secondary Insurance: 1D#: Group #:  SCHEDULE INFORMATION: Date: 05/11/2021 Time: Location: [facility]

## 2021-04-26 NOTE — Telephone Encounter (Signed)
CALLED PATIENT NO ANSWER LEFT VOICEMAIL FOR A CALL BACK LETTER SENT 

## 2021-04-28 ENCOUNTER — Ambulatory Visit: Payer: Self-pay

## 2021-05-03 DIAGNOSIS — J209 Acute bronchitis, unspecified: Secondary | ICD-10-CM | POA: Diagnosis not present

## 2021-05-03 DIAGNOSIS — J019 Acute sinusitis, unspecified: Secondary | ICD-10-CM | POA: Diagnosis not present

## 2021-05-03 DIAGNOSIS — B9689 Other specified bacterial agents as the cause of diseases classified elsewhere: Secondary | ICD-10-CM | POA: Diagnosis not present

## 2021-05-03 DIAGNOSIS — M5412 Radiculopathy, cervical region: Secondary | ICD-10-CM | POA: Diagnosis not present

## 2021-05-05 ENCOUNTER — Other Ambulatory Visit: Payer: Self-pay

## 2021-05-05 MED ORDER — PEG 3350-KCL-NA BICARB-NACL 420 G PO SOLR: 4000.0000 mL | Freq: Once | ORAL | 0 refills | Status: AC

## 2021-05-05 NOTE — Progress Notes (Signed)
Sent in a cheaper medicine th eclenpiq was over 100 dollars

## 2021-05-08 DIAGNOSIS — G4733 Obstructive sleep apnea (adult) (pediatric): Secondary | ICD-10-CM | POA: Diagnosis not present

## 2021-05-10 ENCOUNTER — Encounter: Payer: Self-pay | Admitting: Gastroenterology

## 2021-05-11 ENCOUNTER — Ambulatory Visit: Payer: Medicare HMO | Admitting: Anesthesiology

## 2021-05-11 ENCOUNTER — Ambulatory Visit
Admission: RE | Admit: 2021-05-11 | Discharge: 2021-05-11 | Disposition: A | Discharge status: Home / Self Care | Payer: Medicare HMO | Attending: Gastroenterology | Admitting: Gastroenterology

## 2021-05-11 ENCOUNTER — Encounter: Payer: Self-pay | Admitting: Gastroenterology

## 2021-05-11 ENCOUNTER — Encounter
Admission: RE | Disposition: A | Discharge status: Home / Self Care | Payer: Self-pay | Source: Home / Self Care | Attending: Gastroenterology

## 2021-05-11 DIAGNOSIS — Z6841 Body Mass Index (BMI) 40.0 and over, adult: Secondary | ICD-10-CM | POA: Diagnosis not present

## 2021-05-11 DIAGNOSIS — Z801 Family history of malignant neoplasm of trachea, bronchus and lung: Secondary | ICD-10-CM | POA: Diagnosis not present

## 2021-05-11 DIAGNOSIS — Z1211 Encounter for screening for malignant neoplasm of colon: Secondary | ICD-10-CM | POA: Insufficient documentation

## 2021-05-11 DIAGNOSIS — D122 Benign neoplasm of ascending colon: Secondary | ICD-10-CM | POA: Insufficient documentation

## 2021-05-11 DIAGNOSIS — F17290 Nicotine dependence, other tobacco product, uncomplicated: Secondary | ICD-10-CM | POA: Insufficient documentation

## 2021-05-11 DIAGNOSIS — F17220 Nicotine dependence, chewing tobacco, uncomplicated: Secondary | ICD-10-CM | POA: Diagnosis not present

## 2021-05-11 DIAGNOSIS — K635 Polyp of colon: Secondary | ICD-10-CM | POA: Diagnosis not present

## 2021-05-11 DIAGNOSIS — K5730 Diverticulosis of large intestine without perforation or abscess without bleeding: Secondary | ICD-10-CM | POA: Insufficient documentation

## 2021-05-11 DIAGNOSIS — R69 Illness, unspecified: Secondary | ICD-10-CM | POA: Diagnosis not present

## 2021-05-11 HISTORY — PX: COLONOSCOPY WITH PROPOFOL: SHX5780

## 2021-05-11 LAB — HM COLONOSCOPY

## 2021-05-11 SURGERY — COLONOSCOPY WITH PROPOFOL
Anesthesia: General

## 2021-05-11 MED ORDER — PROPOFOL 500 MG/50ML IV EMUL
INTRAVENOUS | Status: DC | PRN
  Administered 2021-05-11: 100 ug/kg/min via INTRAVENOUS

## 2021-05-11 MED ORDER — PROPOFOL 10 MG/ML IV BOLUS
INTRAVENOUS | Status: DC | PRN
  Administered 2021-05-11: 80 mg via INTRAVENOUS

## 2021-05-11 MED ORDER — LIDOCAINE HCL (CARDIAC) PF 100 MG/5ML IV SOSY
PREFILLED_SYRINGE | INTRAVENOUS | Status: DC | PRN
  Administered 2021-05-11: 50 mg via INTRAVENOUS

## 2021-05-11 MED ORDER — SODIUM CHLORIDE 0.9 % IV SOLN: INTRAVENOUS | Status: DC

## 2021-05-11 NOTE — H&P (Signed)
Jonathon Bellows, MD [address] [place], [place], [place], [address] [id number], [place], [place], [place], [address] Phone: [phone]  Fax: [phone]  Primary Care Physician:  Lavera Guise, MD   Pre-Procedure History & Physical: HPI:  Jimmy A Vint Pola. is a 75 y.o. male is here for an colonoscopy.   Past Medical History:  Diagnosis Date   Anxiety and depression    Frequency    Gallstones 03/05/2013   Hypertension    Kidney problem   May  2013   Mixed hyperlipidemia    OAB (overactive bladder)    Obstructive sleep apnea (adult) (pediatric)     Past Surgical History:  Procedure Laterality Date   HAND SURGERY Bilateral    HERNIA REPAIR     x 2   NOSE SURGERY     REPLACEMENT TOTAL KNEE      Prior to Admission medications   Medication Sig Start Date End Date Taking? Authorizing Provider  FLUoxetine (PROZAC) 20 MG capsule Take 1 capsule (20 mg total) by mouth daily. 04/19/21  Yes Abernathy, Yetta Flock, NP  losartan (COZAAR) 25 MG tablet Take 1 tablet (25 mg total) by mouth daily. 12/15/20  Yes Abernathy, Yetta Flock, NP  rosuvastatin (CRESTOR) 5 MG tablet Take 1 tablet (5 mg total) by mouth daily. 04/19/21  Yes Abernathy, Yetta Flock, NP  terazosin (HYTRIN) 10 MG capsule Take 1 capsule (10 mg total) by mouth at bedtime. 04/19/21  Yes Abernathy, Yetta Flock, NP  cholecalciferol (VITAMIN D) 400 UNITS TABS tablet Take 400 Units by mouth daily.    [provider]  Cyanocobalamin (B-12) 2500 MCG TABS Take 1 tablet by mouth daily.    [provider]  glucosamine-chondroitin 500-400 MG tablet Take 1 tablet by mouth 2 (two) times daily.    [provider]  meloxicam (MOBIC) 15 MG tablet Take 1 tablet (15 mg total) by mouth daily. 04/19/21   Jonetta Osgood, NP  Misc. Devices MISC cpap    [provider]  Multiple Vitamins-Minerals (MULTIVITAMIN WITH MINERALS) tablet Take 1 tablet by mouth daily. Reported on 09/11/2015    [provider]  Probiotic  Product (PROBIOTIC-10 PO) Take by mouth.    [provider]  Pyridoxine HCl (B-6) 100 MG TABS Take by mouth.    [provider]  Sod Picosulfate-Mag Ox-Cit Acd (CLENPIQ) 10-3.5-12 MG-GM -GM/160ML SOLN Take 1 kit by mouth as directed. At 5 PM evening before procedure, drink 1 bottle of Clenpiq, hydrate, drink (5) 8 oz of water. Then do the same thing 5 hours prior to your procedure. 04/26/21   Jonathon Bellows, MD  tiZANidine (ZANAFLEX) 4 MG tablet Take 1 tablet (4 mg total) by mouth at bedtime as needed for muscle spasms. 04/19/21   Jonetta Osgood, NP  triamcinolone (KENALOG) 0.025 % ointment Apply 1 application topically 2 (two) times daily. 07/28/17   Ronnell Freshwater, NP    Allergies as of 04/26/2021 - Review Complete 04/26/2021  Allergen Reaction Noted   Bee venom Anaphylaxis 04/24/2015   Penicillins Rash 03/05/2013    Family History  Problem Relation Age of Onset   COPD Mother    Heart disease Mother    Cancer Father        Brain tumor   Lung cancer Paternal Uncle    Cancer - Other Maternal Grandmother        Back   Kidney disease Neg Hx    Prostate cancer Neg Hx     Social History  Socioeconomic History   Marital status: Married    Spouse name: Not on file   Number of children: Not on file   Years of education: Not on file   Highest education level: Not on file  Occupational History   Not on file  Tobacco Use   Smoking status: Some Days    Types: Cigars   Smokeless tobacco: Current    Types: Chew   Tobacco comments:    smokes cigars onces in a while  Vaping Use   Vaping Use: Never used  Substance and Sexual Activity   Alcohol use: No    Alcohol/week: 0.0 standard drinks   Drug use: No   Sexual activity: Not on file  Other Topics Concern   Not on file  Social History Narrative   Not on file   Social Determinants of Health   Financial Resource Strain: Not on file  Food Insecurity: Not on file  Transportation Needs: Not on file  Physical  Activity: Not on file  Stress: Not on file  Social Connections: Not on file  Intimate Partner Violence: Not on file    Review of Systems: See HPI, otherwise negative ROS  Physical Exam: BP (!) 149/76   Pulse 61   Temp (!) 96.9 F (36.1 C) (Temporal)   Resp 16   Ht 5' 9"  (1.753 m)   Wt 133.4 kg   SpO2 98%   BMI 43.42 kg/m  General:   Alert,  pleasant and cooperative in NAD Head:  Normocephalic and atraumatic. Neck:  Supple; no masses or thyromegaly. Lungs:  Clear throughout to auscultation, normal respiratory effort.    Heart:  +S1, +S2, Regular rate and rhythm, No edema. Abdomen:  Soft, nontender and nondistended. Normal bowel sounds, without guarding, and without rebound.   Neurologic:  Alert and  oriented x4;  grossly normal neurologically.  Impression/Plan: Jimmy A Yevette Edwards. is here for an colonoscopy to be performed for Screening colonoscopy average risk   Risks, benefits, limitations, and alternatives regarding  colonoscopy have been reviewed with the patient.  Questions have been answered.  All parties agreeable.   Jonathon Bellows, MD  05/11/2021, 11:20 AM

## 2021-05-11 NOTE — Transfer of Care (Signed)
Immediate Anesthesia Transfer of Care Note  Patient: Jimmy Norton.  Procedure(s) Performed: COLONOSCOPY WITH PROPOFOL  Patient Location: PACU and Endoscopy Unit  Anesthesia Type:General  Level of Consciousness: drowsy and patient cooperative  Airway & Oxygen Therapy: Patient Spontanous Breathing  Post-op Assessment: Report given to RN and Post -op Vital signs reviewed and stable  Post vital signs: Reviewed and stable  Last Vitals:  Vitals Value Taken Time  BP 105/74 05/11/21 1151  Temp    Pulse 64 05/11/21 1156  Resp 23 05/11/21 1156  SpO2 91 % 05/11/21 1156  Vitals shown include unvalidated device data.  Last Pain:  Vitals:   05/11/21 1150  TempSrc:   PainSc: 0-No pain         Complications: No notable events documented.

## 2021-05-11 NOTE — Op Note (Signed)
[facility] Gastroenterology Patient Name: Jimmy Norton Procedure Date: 05/11/2021 11:19 AM MRN: [id number] Account #: [id number] Date of Birth: 21-May-1946 Admit Type: Outpatient Age: 75 Room: [facility] ENDO ROOM 2 Gender: Male Note Status: Finalized Instrument Name: Jasper Riling [id number] Procedure:             Colonoscopy Indications:           Screening for colorectal malignant neoplasm Providers:             Jonathon Bellows MD, MD Referring MD:          Lavera Guise, MD (Referring MD) Medicines:             Monitored Anesthesia Care Complications:         No immediate complications. Procedure:             Pre-Anesthesia Assessment:                        - Prior to the procedure, a History and Physical was                         performed, and patient medications, allergies and                         sensitivities were reviewed. The patient's tolerance                         of previous anesthesia was reviewed.                        - The risks and benefits of the procedure and the                         sedation options and risks were discussed with the                         patient. All questions were answered and informed                         consent was obtained.                        - ASA Grade Assessment: II - A patient with mild                         systemic disease.                        After obtaining informed consent, the colonoscope was                         passed under direct vision. Throughout the procedure,                         the patient's blood pressure, pulse, and oxygen                         saturations were monitored continuously. The                         Colonoscope was  introduced through the anus and                         advanced to the the cecum, identified by the                         appendiceal orifice. The colonoscopy was performed                         with ease. The patient tolerated the procedure well.                          The quality of the bowel preparation was good. Findings:      The perianal and digital rectal examinations were normal.      Multiple small-mouthed diverticula were found in the entire colon.      A 7 mm polyp was found in the ascending colon. The polyp was sessile.       The polyp was removed with a cold snare. Resection and retrieval were       complete.      The exam was otherwise without abnormality on direct and retroflexion       views. Impression:            - Diverticulosis in the entire examined colon.                        - One 7 mm polyp in the ascending colon, removed with                         a cold snare. Resected and retrieved.                        - The examination was otherwise normal on direct and                         retroflexion views. Recommendation:        - Discharge patient to home (with escort).                        - Resume previous diet.                        - Continue present medications.                        - Await pathology results.                        - Repeat colonoscopy is not recommended due to current                         age (26 years or older) for surveillance based on                         pathology results. Procedure Code(s):     --- Professional ---                        [phone], Colonoscopy, flexible; with removal of  tumor(s), polyp(s), or other lesion(s) by snare                         technique Diagnosis Code(s):     --- Professional ---                        K63.5, Polyp of colon                        Z12.11, Encounter for screening for malignant neoplasm                         of colon                        K57.30, Diverticulosis of large intestine without                         perforation or abscess without bleeding CPT copyright 2019 American Medical Association. All rights reserved. The codes documented in this report are preliminary and upon coder review may  be  revised to meet current compliance requirements. Jonathon Bellows, MD Jonathon Bellows MD, MD 05/11/2021 11:47:51 AM This report has been signed electronically. Number of Addenda: 0 Note Initiated On: 05/11/2021 11:19 AM Scope Withdrawal Time: 0 hours 9 minutes 42 seconds  Total Procedure Duration: 0 hours 13 minutes 21 seconds  Estimated Blood Loss:  Estimated blood loss: none.      [facility]

## 2021-05-11 NOTE — Anesthesia Preprocedure Evaluation (Signed)
Anesthesia Evaluation  Patient identified by MRN, date of birth, ID band Patient awake    Reviewed: Allergy & Precautions, NPO status , Patient's Chart, lab work & pertinent test results  History of Anesthesia Complications Negative for: history of anesthetic complications  Airway Mallampati: III   Neck ROM: Full    Dental  (+) Edentulous Upper, Edentulous Lower   Pulmonary sleep apnea ,  Chewing tobacco use   Pulmonary exam normal breath sounds clear to auscultation       Cardiovascular hypertension, Normal cardiovascular exam Rhythm:Regular Rate:Normal     Neuro/Psych PSYCHIATRIC DISORDERS Anxiety Depression negative neurological ROS     GI/Hepatic negative GI ROS,   Endo/Other  Class 3 obesity  Renal/GU negative Renal ROS   BPH    Musculoskeletal  (+) Arthritis ,   Abdominal   Peds  Hematology negative hematology ROS (+)   Anesthesia Other Findings   Reproductive/Obstetrics                             Anesthesia Physical Anesthesia Plan  ASA: 3  Anesthesia Plan: General   Post-op Pain Management:    Induction: Intravenous  PONV Risk Score and Plan: 1 and Propofol infusion, TIVA and Treatment may vary due to age or medical condition  Airway Management Planned: Natural Airway  Additional Equipment:   Intra-op Plan:   Post-operative Plan:   Informed Consent: I have reviewed the patients History and Physical, chart, labs and discussed the procedure including the risks, benefits and alternatives for the proposed anesthesia with the patient or authorized representative who has indicated his/her understanding and acceptance.       Plan Discussed with: CRNA  Anesthesia Plan Comments:         Anesthesia Quick Evaluation

## 2021-05-12 ENCOUNTER — Encounter: Payer: Self-pay | Admitting: Gastroenterology

## 2021-05-12 LAB — SURGICAL PATHOLOGY

## 2021-05-12 NOTE — Anesthesia Postprocedure Evaluation (Signed)
Anesthesia Post Note  Patient: Jimmy Norton.  Procedure(s) Performed: COLONOSCOPY WITH PROPOFOL  Patient location during evaluation: PACU Anesthesia Type: General Level of consciousness: awake and alert, oriented and patient cooperative Pain management: pain level controlled Vital Signs Assessment: post-procedure vital signs reviewed and stable Respiratory status: spontaneous breathing, nonlabored ventilation and respiratory function stable Cardiovascular status: blood pressure returned to baseline and stable Postop Assessment: adequate PO intake Anesthetic complications: no   No notable events documented.   Last Vitals:  Vitals:   05/11/21 1150 05/11/21 1202  BP: 105/76   Pulse:    Resp: 16   Temp:  (!) 35.7 C  SpO2: 97%     Last Pain:  Vitals:   05/11/21 1220  TempSrc:   PainSc: 0-No pain                 Darrin Nipper

## 2021-05-13 ENCOUNTER — Encounter: Payer: Self-pay | Admitting: Gastroenterology

## 2021-05-14 ENCOUNTER — Other Ambulatory Visit: Payer: Self-pay | Admitting: Nurse Practitioner

## 2021-05-14 DIAGNOSIS — F331 Major depressive disorder, recurrent, moderate: Secondary | ICD-10-CM

## 2021-05-23 ENCOUNTER — Encounter: Payer: Self-pay | Admitting: Internal Medicine

## 2021-05-26 ENCOUNTER — Ambulatory Visit (INDEPENDENT_AMBULATORY_CARE_PROVIDER_SITE_OTHER): Payer: Medicare HMO

## 2021-05-26 ENCOUNTER — Other Ambulatory Visit: Payer: Self-pay

## 2021-05-26 DIAGNOSIS — G4733 Obstructive sleep apnea (adult) (pediatric): Secondary | ICD-10-CM

## 2021-05-26 NOTE — Progress Notes (Signed)
95 percentile pressure 11.7   95th percentile leak 27.8   apnea index 0.3 /hr  apnea-hypopnea index  1.0 /hr   total days used  >4 hr 84 days  total days used <4 hr 0 days  Total compliance 93 percent  No problems or questions, but may want to look at getting a full face mask     Pt was seen by Claiborne Billings from [facility]

## 2021-05-28 DIAGNOSIS — M5030 Other cervical disc degeneration, unspecified cervical region: Secondary | ICD-10-CM | POA: Diagnosis not present

## 2021-05-28 DIAGNOSIS — M5416 Radiculopathy, lumbar region: Secondary | ICD-10-CM | POA: Diagnosis not present

## 2021-05-28 DIAGNOSIS — G8929 Other chronic pain: Secondary | ICD-10-CM | POA: Diagnosis not present

## 2021-05-28 DIAGNOSIS — M25562 Pain in left knee: Secondary | ICD-10-CM | POA: Diagnosis not present

## 2021-05-28 DIAGNOSIS — M5136 Other intervertebral disc degeneration, lumbar region: Secondary | ICD-10-CM | POA: Diagnosis not present

## 2021-05-28 DIAGNOSIS — M5020 Other cervical disc displacement, unspecified cervical region: Secondary | ICD-10-CM | POA: Diagnosis not present

## 2021-05-28 DIAGNOSIS — M5412 Radiculopathy, cervical region: Secondary | ICD-10-CM | POA: Diagnosis not present

## 2021-05-28 DIAGNOSIS — M4802 Spinal stenosis, cervical region: Secondary | ICD-10-CM | POA: Diagnosis not present

## 2021-06-07 ENCOUNTER — Telehealth: Payer: Self-pay | Admitting: Student-PharmD

## 2021-06-07 NOTE — Progress Notes (Addendum)
Chronic Care Management Pharmacy Assistant   Name: Jimmy Norton.  MRN: [id number] DOB: 12/01/1945  Jimmy Norton. is an 75 y.o. year old male who presents for his initial CCM visit with the clinical pharmacist.  Reason for Encounter: Chart Prep   Conditions to be addressed/monitored: HTN, HLD.  Primary concerns for visit include: HTN  Recent office visits:  04/19/21 Jonetta Osgood, NP. For follow-up. No medication changes.  02/15/21 Jonetta Osgood, NP. For follow-up. No medication changes.  01/04/21 Dr. Humphrey Rolls For follow-up. No medication changes. 12/15/20 Jonetta Osgood, NP. For follow-up. No medication changes.   Recent consult visits:  06/03/21 [facility] and Department No medication changes. 05/28/21 [place], NP For follow-up. No medication changes.  05/03/21 [facility] Cyndie Chime, MD For cough. Per note: Take medications as prescribed. Plain Mucinex twice daily for 7-10 days. Drink plenty of fluids and rest. May take Tylenol as needed for pain or fever. Avoid antihistamines for 7-10 days. Follow up in 3-5 days if not much better; 7-10 days if not entirely better. May combine your muscle relaxant with the prednisone for symptoms of "pinched nerve" in your right neck. Follow-up with Dr. Sharlet Salina as scheduled. 04/13/21 [facility] and Department No medication changes. 03/25/21 [facility], Sherren Kerns., NP. For physical therapy.  03/15/21 [facility], Sherren Kerns., NP. For physical therapy.  02/16/21 [facility], Sherren Kerns., NP. For physical therapy. Per note: Discontinue tizanidine as patient has had hallucinations with this medication. 01/29/21 Internal Medicine Cyndie Chime For pain. No more information given.  Hospital visits:  05/11/21 [facility] (3 Hours) Jonathon Bellows, MD. For colonoscopy.    Medication History: Rosuvastatin 5 mg 03/24/21 90 DS. Losartan 25 mg 12/15/20 90 DS.  Medications: Outpatient Encounter Medications as of 06/07/2021  Medication Sig   cholecalciferol (VITAMIN D) 400 UNITS TABS tablet Take 400 Units by mouth daily.   Cyanocobalamin (B-12) 2500 MCG TABS Take 1 tablet by mouth daily.   FLUoxetine (PROZAC) 20 MG capsule Take 1 capsule (20 mg total) by mouth daily.   glucosamine-chondroitin 500-400 MG tablet Take 1 tablet by mouth 2 (two) times daily.   losartan (COZAAR) 25 MG tablet Take 1 tablet (25 mg total) by mouth daily.   meloxicam (MOBIC) 15 MG tablet Take 1 tablet (15 mg total) by mouth daily.   Misc. Devices MISC cpap   Multiple Vitamins-Minerals (MULTIVITAMIN WITH MINERALS) tablet Take 1 tablet by mouth daily. Reported on 09/11/2015   Probiotic Product (PROBIOTIC-10 PO) Take by mouth.   Pyridoxine HCl (B-6) 100 MG TABS Take by mouth.   rosuvastatin (CRESTOR) 5 MG tablet Take 1 tablet (5 mg total) by mouth daily.   Sod Picosulfate-Mag Ox-Cit Acd (CLENPIQ) 10-3.5-12 MG-GM -GM/160ML SOLN Take 1 kit by mouth as directed. At 5 PM evening before procedure, drink 1 bottle of Clenpiq, hydrate, drink (5) 8 oz of water. Then do the same thing 5 hours prior to your procedure.   terazosin (HYTRIN) 10 MG capsule Take 1 capsule (10 mg total) by mouth at bedtime.   tiZANidine (ZANAFLEX) 4 MG tablet Take 1 tablet (4 mg total) by mouth at bedtime as needed for muscle spasms.   triamcinolone (KENALOG) 0.025 % ointment Apply 1 application topically 2 (two) times daily.   No facility-administered encounter medications on file as of 06/07/2021.   Have you seen any other providers since your last visit? Patient stated  no.   Any changes in your medications or health? Patient stated no.   Any side effects from any medications? Patient stated no.  Do you have an symptoms or problems not managed by your medications? Patients wife stated no.  Any concerns about your  health right now? Patients wife stated she is worried about his back, shoulder, knee pain. She stated they do have up coming appointments in regards of these concerns.   Has your provider asked that you check blood pressure, blood sugar, or follow special diet at home? Patients wife stated he checks his blood pressure occasionally.   Do you get any type of exercise on a regular basis? Patients wife stated no.   Can you think of a goal you would like to reach for your health? Patients wife stated she would like for him to loose weight.   Do you have any problems getting your medications? Patients wife stated no.   Is there anything that you would like to discuss during the appointment? Patients wife stated no.   Please bring medications and supplements to appointment, patient reminded of his face to face appointment on 06/11/21 at 5 am.   Follow-Up:Pharmacist Review  Charlann Lange, RMA Clinical Pharmacist Assistant [phone]  5 minutes spent in review, coordination, and documentation.  Reviewed by: Alena Bills, PharmD Clinical Pharmacist [phone]

## 2021-06-07 NOTE — Progress Notes (Signed)
Chronic Care Management Pharmacy Note  06/11/2021 Name:  Jimmy Norton. MRN:  [id number] DOB:  [birth year]  Summary: Patient reports no interest in activities and does not see the benefit in taking medications. Sent a message to PCP for referral to therapy for patient, at patient request Counseled patient on benefit of medications and importance of adherence   Recommendations/Changes made from today's visit: Recommended scheduled tylenol for better pain control Informed patient to stop taking Naproxen 275m 4 tabs per dose and educated on correct dose of 2 tabs twice daily (not to be taken if taking meloxicam)  Plan: F/U on 07/14/22 with patient and wife   Subjective: Jimmy Norton is an 89y.o. year old male who is a primary patient of KHumphrey Rolls FTimoteo Gaul MD.  The CCM team was consulted for assistance with disease management and care coordination needs.    Engaged with patient face to face for initial visit in response to provider referral for pharmacy case management and/or care coordination services.   Consent to Services:  The patient was given the following information about Chronic Care Management services today, agreed to services, and gave verbal consent: 1. CCM service includes personalized support from designated clinical staff supervised by the primary care provider, including individualized plan of care and coordination with other care providers 2. 24/7 contact phone numbers for assistance for urgent and routine care needs. 3. Service will only be billed when office clinical staff spend 20 minutes or more in a month to coordinate care. 4. Only one practitioner may furnish and bill the service in a calendar month. 5.The patient may stop CCM services at any time (effective at the end of the month) by phone call to the office staff. 6. The patient will be responsible for cost sharing (co-pay) of up to 20% of the service fee (after annual deductible is met). Patient agreed to  services and consent obtained.  Patient Care Team: KLavera Guise MD as PCP - General (Internal Medicine) SChristene Lye MD (General Surgery) HCollier Flowers MD as Referring Physician HAlena Bills [facility] Hospitalas Pharmac[facility] (Pharmacist)  Recent office visits: 04/19/21 AJonetta Osgood NP. For follow-up. No medication changes.  02/15/21 AJonetta Osgood NP. For follow-up. No medication changes.  01/04/21 Dr. KHumphrey RollsFor follow-up. No medication changes. 12/15/20 AJonetta Osgood NP. For follow-up. No medication changes.   [facility]: [facility]. 05/28/21 [place] For follow-up. No medication changes.  05/03/21 [facility] For cough. Per note: Take medications as prescribed. Plain Mucinex twice daily for 7-10 days. Drink plenty of fluids and rest. May take Tylenol as needed for pain or fever. Avoid antihistamines for 7-10 days. Follow up in 3-5 days if not much better; 7-10 days if not entirely better. May combine your muscle relaxant with the prednisone for symptoms of "pinched nerve" in your right neck. Follow-up with Dr. CSharlet Salinaas scheduled. [facility]21 [facility] No medication changes. 03/25/21 [facility], WSherren Kerns, NP. For physical therapy.  03/15/21 [facility], WSherren Kerns, NP. For physical therapy.  02/16/21 Family Medicine/[facility], WSherren Kerns, NP. For physical therapy. Per note: Discontinue tizanidine as patient has had hallucinations with this medication. 01/29/21 Internal Medicine ECyndie ChimeFor pain. No more information given.  [facility] AJonathon Bellows MD. For colonoscopy.    Objective:  Lab Results  Component Value Date   CREATININE 0.85 09/21/2020   BUN 13 09/21/2020    GFRNONAA 85 08/27/2019   GFRAA 98 08/27/2019   NA 139 09/21/2020   K 4.4 09/21/2020   CALCIUM 9.2 09/21/2020   CO2 19 (L) 09/21/2020   GLUCOSE 105 (H) 09/21/2020    Lab Results  Component Value Date/Time   HGBA1C 5.8 (H) 08/27/2019 10:06 AM   HGBA1C 5.8 (H) 07/30/2018 10:06 AM   HGBA1C 6.0 12/18/2011 02:31 PM    Last diabetic Eye exam: No results found for: HMDIABEYEEXA  Last diabetic Foot exam: No results found for: HMDIABFOOTEX   Lab Results  Component Value Date   CHOL 137 09/21/2020   HDL 32 (L) 09/21/2020   LDLCALC 63 09/21/2020   TRIG 259 (H) 09/21/2020   CHOLHDL 3.9 07/30/2018    Hepatic Function Latest Ref Rng & Units 09/21/2020 08/27/2019 07/30/2018  Total Protein 6.0 - 8.5 g/dL 6.5 6.3 6.5  Albumin 3.7 - 4.7 g/dL 4.0 4.1 4.1  AST 0 - 40 IU/L 29 33 26  ALT 0 - 44 IU/L 23 30 31   Alk Phosphatase 44 - 121 IU/L 98 92 90  Total Bilirubin 0.0 - 1.2 mg/dL 0.3 0.4 0.4  Bilirubin, Direct 0.00 - 0.40 mg/dL - - -    Lab Results  Component Value Date/Time   TSH 2.460 09/21/2020 11:23 AM   TSH 2.940 08/27/2019 10:06 AM   FREET4 0.94 09/21/2020 11:23 AM   FREET4 0.94 08/27/2019 10:06 AM    CBC Latest Ref Rng & Units 09/21/2020 08/27/2019 07/30/2018  WBC 3.4 - 10.8 x10E3/uL 7.5 6.0 6.4  Hemoglobin 13.0 - 17.7 g/dL 15.2 14.9 15.4  Hematocrit 37.5 - 51.0 % 45.1 46.1 45.4  Platelets 150 - 450 x10E3/uL 257 219 220    No results found for: VD25OH  Clinical ASCVD: No  The 10-year ASCVD risk score (Arnett DK, et al., 2019) is: 45.8%   Values used to calculate the score:     Age: 75 years     Sex: Male     Is Non-Hispanic African American: No     Diabetic: Yes     Tobacco smoker: Yes     Systolic Blood Pressure: 923 mmHg     Is BP treated: Yes     HDL Cholesterol: 32 mg/dL     Total Cholesterol: 137 mg/dL    Depression screen [facility] 2/9 04/19/2021 02/15/2021 12/15/2020  Decreased Interest 1 0 0  Down, Depressed, Hopeless 1 0 0  PHQ - 2 Score 2 0 0  Altered sleeping 1 - -   Tired, decreased energy 3 - -  Change in appetite 0 - -  Feeling bad or failure about yourself  1 - -  Trouble concentrating 1 - -  Moving slowly or fidgety/restless 0 - -  Suicidal thoughts 0 - -  PHQ-9 Score 8 - -  Difficult doing work/chores - - -  Some recent data might be hidden      Social History   Tobacco Use  Smoking Status Some Days   Types: Cigars  Smokeless Tobacco Current   Types: Chew  Tobacco Comments   smokes cigars onces in a while   BP Readings from Last 3 Encounters:  05/11/21 105/76  04/19/21 130/72  02/15/21 (!) 148/62   Pulse Readings from Last 3 Encounters:  05/11/21 61  04/19/21 75  02/15/21 98   Wt Readings from Last 3 Encounters:  05/11/21 294 lb (133.4 kg)  04/19/21 (!) 303 lb 3.2 oz (137.5 kg)  02/15/21 (!) 301 lb 3.2 oz (136.6 kg)   BMI Readings from Last 3 Encounters:  05/11/21 43.42 kg/m  04/19/21 44.77 kg/m  02/15/21 44.48 kg/m    Assessment/Interventions: Review of patient past medical history, allergies, medications, health status, including review of consultants reports, laboratory and other test data, was performed as part of comprehensive evaluation and provision of chronic care management services.   SDOH:  (Social Determinants of Health) assessments and interventions performed: Yes  SDOH Screenings   Alcohol Screen: Low Risk    Last Alcohol Screening Score (AUDIT): 0  Depression (PHQ2-9): Medium Risk   PHQ-2 Score: 8  Financial Resource Strain: Low Risk    Difficulty of Paying Living Expenses: Not hard at all  Food Insecurity: Not on file  Housing: Not on file  Physical Activity: Not on file  Social Connections: Not on file  Stress: Not on file  Tobacco Use: High Risk   Smoking Tobacco Use: Some Days   Smokeless Tobacco Use: Current   Passive Exposure: Not on file  Transportation Needs: Not on file    [place]  Allergies  Allergen Reactions   Bee Venom Anaphylaxis   Penicillins Rash     Medications Reviewed Today     Reviewed by Alena Bills, [facility] (Pharmacist) on 06/11/21 at 1150  Med List Status: <None>   Medication Order Taking? Sig Documenting Provider Last Dose Status Informant  BACLOFEN PO [id number] Yes Take 10 mg by mouth daily as needed. [provider]  Active Self  cholecalciferol (VITAMIN D) 400 UNITS TABS tablet [id number]  Take 400 Units by mouth daily. [provider]  Active   Cyanocobalamin (B-12) 2500 MCG TABS [id number]  Take 1 tablet by mouth daily. [provider]  Active   FLUoxetine (PROZAC) 20 MG capsule [id number]  Take 1 capsule (20 mg total) by mouth daily. Jonetta Osgood, NP  Active   glucosamine-chondroitin 500-400 MG tablet [id number]  Take 1 tablet by mouth 2 (two) times daily. [provider]  Active   losartan (COZAAR) 25 MG tablet [id number]  Take 1 tablet (25 mg total) by mouth daily. Jonetta Osgood, NP  Active   meloxicam (MOBIC) 15 MG tablet [id number]  Take 1 tablet (15 mg total) by mouth daily. Jonetta Osgood, NP  Active   Misc. Devices [place] [id number]  cpap [provider]  Active   Multiple Vitamins-Minerals (MULTIVITAMIN WITH MINERALS) tablet [id number]  Take 1 tablet by mouth daily. Reported on 09/11/2015 [provider]  Active   Probiotic Product (PROBIOTIC-10 PO) [id number]  Take by mouth. [provider]  Active   Pyridoxine HCl (B-6) 100 MG TABS [id number]  Take by mouth. [provider]  Active   rosuvastatin (CRESTOR) 5 MG tablet [id number]  Take 1 tablet (5 mg total) by mouth daily. Jonetta Osgood, NP  Active   Sod Picosulfate-Mag Ox-Cit Acd (CLENPIQ) 10-3.5-12 MG-GM -GM/160ML SOLN [id number]  Take 1 kit by mouth as directed. At 5 PM evening before procedure, drink 1 bottle of Clenpiq, hydrate, drink (5) 8 oz of water. Then do the same thing 5 hours prior to your procedure. Jonathon Bellows, MD  Active   terazosin (HYTRIN) 10 MG capsule [id number]  Take 1 capsule  (10 mg total) by mouth at bedtime. Jonetta Osgood, NP  Active   tiZANidine (ZANAFLEX) 4 MG tablet [id number] No Take 1 tablet (4 mg total) by mouth at bedtime as needed for muscle spasms.  Patient not taking: Reported on 06/11/2021   Jonetta Osgood, NP Not Taking Active   triamcinolone (KENALOG) 0.025 % ointment [id number]  Apply 1 application topically 2 (two) times daily. Ronnell Freshwater, NP  Active             Patient Active Problem List   Diagnosis Date Noted   Noncompliance with medication regimen 12/15/2020   Syncope 04/15/2020   Abrasion of left forearm 04/15/2020   Abrasion of right lower leg 04/15/2020   Laceration of left forearm without complication 41/93/7902   Encounter for general adult medical examination with abnormal findings 08/27/2019   Left knee pain 08/27/2019   Cervical stenosis of spinal canal 11/06/2018   Screening for prostate cancer 07/30/2018   Dysuria 07/30/2018   Arthritis of carpometacarpal [facility]) joint of right thumb 07/02/2018   Primary generalized (osteo)arthritis 06/10/2018   Cervical disc disease with myelopathy 06/10/2018   Anxiety 06/08/2018   Depression 06/08/2018   BPH (benign prostatic hyperplasia) 06/08/2018   Neuropathy 06/08/2018   Morbid obesity with body mass index (BMI) of 40.0 to 44.9 in adult ([place]) 11/30/2017   Tick bite 11/27/2017   Plantar fasciitis 11/27/2017   Primary osteoarthritis of right elbow 11/27/2017   OSA on CPAP 08/24/2017   Mixed hyperlipidemia 07/28/2017   Allergic rhinitis due to pollen 07/28/2017   Hypoxemia 07/28/2017   Calculus of kidney 07/28/2017   Chest pain, unspecified 07/28/2017   Major depressive disorder, recurrent, moderate ([place]) 07/28/2017   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   Pain in left ankle and joints of left foot 07/28/2017   Essential (primary) hypertension 07/28/2017   Elevated hemoglobin A1c 12/02/2015   Urge incontinence 11/25/2015   Urinary frequency 10/21/2015   OAB  (overactive bladder) 09/23/2015   Frequency 09/18/2015   Gallstones 03/05/2013    Immunization History  Administered Date(s) Administered   Influenza, High Dose Seasonal PF 04/02/2018   Influenza, Seasonal, Injecte, Preservative Fre 04/07/2011   Influenza,inj,quad, With Preservative 04/16/2019   Influenza-Unspecified 06/15/2010, 09/06/2016, 05/02/2017, 05/04/2017, 04/06/2018, 04/25/2019, 06/08/2020, 04/24/2021   Pneumococcal Conjugate-13 11/07/2016, 04/02/2018   Pneumococcal Polysaccharide-23 04/07/2011   Tdap 10/06/2011, 07/25/2012, 04/26/2013   Zoster Recombinat (Shingrix) 05/13/2021    Conditions to be addressed/monitored:  Hypertension, Hyperlipidemia, Depression, Anxiety, Osteoarthritis, Tobacco use, BPH, and Obesity  Care Plan : [place]  Updates made by Alena Bills, [place] since 06/11/2021 12:00 AM     Problem: HLD, HTN, Obesity, Depression, Chronic Pain   Priority: High     Long-Range Goal: Disease Management   Start Date: 06/11/2021  Expected End Date: 06/11/2022  This Visit's Progress: On track  Priority: High  Note:   Current Barriers:  Does not contact provider office for questions/concerns  Pharmacist Clinical Goal(s):  Patient will achieve adherence to monitoring guidelines and medication adherence to achieve therapeutic efficacy through collaboration with PharmD and provider.   Interventions: 1:1 collaboration with Lavera Guise, MD regarding development and update of comprehensive plan of care as evidenced by provider attestation and co-signature Inter-disciplinary care team collaboration (see longitudinal plan of care) Comprehensive medication review performed; medication list updated in electronic medical record  Hypertension (BP goal <130/80) -Not ideally controlled -Current treatment: Losartan 82m daily -Medications previously tried: No other meds noted  -Current home readings: does not keep a log -Current dietary habits: loves  bread, potatoes, cookies. Trying to watch portions currently. Focused on drinking water and cutting out soda -Current exercise habits: none -Denies hypotensive/hypertensive symptoms -Educated on BP goals and benefits of  medications for prevention of heart attack, stroke and kidney damage; Daily salt intake goal < 2300 mg; Importance of home blood pressure monitoring; Proper BP monitoring technique; -Counseled to monitor BP at home at least once weekly, document, and provide log at future appointments -Counseled on diet and exercise extensively Recommended to continue current medication  Hyperlipidemia: (LDL goal < 70) -Not ideally controlled -Current treatment: Rosuvastatin 85m daily -Medications previously tried: Fenofibrate  -Current dietary patterns: see above -Current exercise habits: see above -Educated on Benefits of statin for ASCVD risk reduction; Importance of limiting foods high in cholesterol; -Counseled on diet and exercise extensively Recommended to continue current medication  Depression/Anxiety (Goal: Well controlled mood) -Not ideally controlled -Current treatment: Fluoxetine 264mdaily -Medications previously tried/failed: Sertraline 2547mConnected with PCP for referral mental health support -Patient reports does not feel like prozac has worked but states wife sees a change -May consider increasing dose to 64m3m needed -Educated on Benefits of medication for symptom control Benefits of cognitive-behavioral therapy with or without medication -Recommended to continue current medication  Tobacco use (Goal Quit Smoking) -Did not assess at this appt, will follow up at next visit  BPH (Goal: No symptoms) -Not ideally controlled -Current treatment  Terazosin 10mg32mbedtime -Medications previously tried: none noted  -Recommended to continue current medication -Patient reports having to go to bathroom every 3 hours and occasional leakage during the day -May  consider benefit of adding oxybutynin or myrbetriq at future appt  Osteoarthritis (Goal: No pain) -Uncontrolled -Current treatment  Meloxicam 15mg 38my Baclofen 10mg a54meded Tylenol 650mg as53mded Naproxen 220mg as 10med -Medications previously tried: Tizanidine (gave hallucinations) -Counseled on diet and exercise extensively -Patient stated taking 4 naproxen at a time. Instructed him to stop doing that and to not take meloxicam if taking naproxen. Find which one works best for him. -Recommended patient begin taking scheduled tylenol 650mg 2 ta64mvery 8 hours to see if that benefits pain control   Obesity (Goal: Normal BMI) -Uncontrolled -Current treatment  Diet -Medications previously tried: none  -Counseled on diet and exercise extensively  Patient Goals/Self-Care Activities Patient will:  - take medications as prescribed as evidenced by patient report and record review check blood pressure once weekly, document, and provide at future appointments  Follow Up Plan: Face to Face appointment with care management team member scheduled for: 07/14/2021       Medication Assistance: None required.  Patient affirms current coverage meets needs.  Compliance/Adherence/Medication fill history: Care Gaps: None  Star-Rating Drugs: Rosuvastatin 5 mg 03/24/21 90 DS. Losartan 25 mg 12/15/20 90 DS.  [facility] [phone]? Yes   We did not discuss Benefits of medication synchronization, packaging and delivery as well as enhanced pharmacist oversight with [employer]. Will follow up at next appt Patient decided to: Continue current medication management strategy  Care Plan and Follow Up Patient Decision:  Patient agrees to Care Plan and Follow-up.  Plan: Face to Face appointment with care management team member  scheduled for: 07/14/2021   Terria Deschepper HazAlena BillsPharmacist [phone]

## 2021-06-08 DIAGNOSIS — G4733 Obstructive sleep apnea (adult) (pediatric): Secondary | ICD-10-CM | POA: Diagnosis not present

## 2021-06-11 ENCOUNTER — Ambulatory Visit: Payer: Medicare HMO | Admitting: Student-PharmD

## 2021-06-11 ENCOUNTER — Other Ambulatory Visit: Payer: Self-pay

## 2021-06-11 DIAGNOSIS — E782 Mixed hyperlipidemia: Secondary | ICD-10-CM

## 2021-06-11 DIAGNOSIS — I10 Essential (primary) hypertension: Secondary | ICD-10-CM

## 2021-06-11 DIAGNOSIS — Z6841 Body Mass Index (BMI) 40.0 and over, adult: Secondary | ICD-10-CM

## 2021-06-11 DIAGNOSIS — N401 Enlarged prostate with lower urinary tract symptoms: Secondary | ICD-10-CM

## 2021-06-11 DIAGNOSIS — G8929 Other chronic pain: Secondary | ICD-10-CM

## 2021-06-11 DIAGNOSIS — M25562 Pain in left knee: Secondary | ICD-10-CM

## 2021-06-11 DIAGNOSIS — F331 Major depressive disorder, recurrent, moderate: Secondary | ICD-10-CM

## 2021-06-11 MED ORDER — LOSARTAN POTASSIUM 25 MG PO TABS
25.0000 mg | ORAL_TABLET | Freq: Every day | ORAL | 3 refills | Status: DC
Start: 2021-06-11 — End: 2021-09-23

## 2021-06-11 NOTE — Patient Instructions (Signed)
Visit Information   Goals Addressed             This Visit's Progress    Manage Chronic Pain   On track    Timeframe:  Long-Range Goal Priority:  High Start Date:  06/11/2021                           Expected End Date:     06/11/2022                  Follow Up Date 07/14/2021    - call for medicine refill 2 or 3 days before it runs out - develop a personal pain management plan - plan exercise or activity when pain is best controlled - prioritize tasks for the day    Why is this important?   Day-to-day life can be hard when you have chronic pain.  Pain medicine is just one piece of the treatment puzzle.  You can try these action steps to help you manage your pain.    Notes:      Track and Manage My Blood Pressure-Hypertension   On track    Timeframe:  Long-Range Goal Priority:  High Start Date:      06/11/2021                       Expected End Date:   06/11/2021                    Follow Up Date 07/14/2021    - check blood pressure weekly    Why is this important?   You won't feel high blood pressure, but it can still hurt your blood vessels.  High blood pressure can cause heart or kidney problems. It can also cause a stroke.  Making lifestyle changes like losing a little weight or eating less salt will help.  Checking your blood pressure at home and at different times of the day can help to control blood pressure.  If the doctor prescribes medicine remember to take it the way the doctor ordered.  Call the office if you cannot afford the medicine or if there are questions about it.     Notes:        Patient Care Plan: CCM Pharmacy Care Plan     Problem Identified: HLD, HTN, Obesity, Depression, Chronic Pain   Priority: High     Long-Range Goal: Disease Management   Start Date: 06/11/2021  Expected End Date: 06/11/2022  This Visit's Progress: On track  Priority: High  Note:   Current Barriers:  Does not contact provider office for  questions/concerns  Pharmacist Clinical Goal(s):  Patient will achieve adherence to monitoring guidelines and medication adherence to achieve therapeutic efficacy through collaboration with PharmD and provider.   Interventions: 1:1 collaboration with Lavera Guise, MD regarding development and update of comprehensive plan of care as evidenced by provider attestation and co-signature Inter-disciplinary care team collaboration (see longitudinal plan of care) Comprehensive medication review performed; medication list updated in electronic medical record  Hypertension (BP goal <130/80) -Not ideally controlled -Current treatment: Losartan 25mg  daily -Medications previously tried: No other meds noted  -Current home readings: does not keep a log -Current dietary habits: loves bread, potatoes, cookies. Trying to watch portions currently. Focused on drinking water and cutting out soda -Current exercise habits: none -Denies hypotensive/hypertensive symptoms -Educated on BP goals and benefits of medications for prevention of heart  attack, stroke and kidney damage; Daily salt intake goal < 2300 mg; Importance of home blood pressure monitoring; Proper BP monitoring technique; -Counseled to monitor BP at home at least once weekly, document, and provide log at future appointments -Counseled on diet and exercise extensively Recommended to continue current medication  Hyperlipidemia: (LDL goal < 70) -Not ideally controlled -Current treatment: Rosuvastatin 5mg  daily -Medications previously tried: Fenofibrate  -Current dietary patterns: see above -Current exercise habits: see above -Educated on Benefits of statin for ASCVD risk reduction; Importance of limiting foods high in cholesterol; -Counseled on diet and exercise extensively Recommended to continue current medication  Depression/Anxiety (Goal: Well controlled mood) -Not ideally controlled -Current treatment: Fluoxetine 20mg   daily -Medications previously tried/failed: Sertraline 25mg  -Connected with PCP for referral mental health support -Patient reports does not feel like prozac has worked but states wife sees a change -May consider increasing dose to 40mg  if needed -Educated on Benefits of medication for symptom control Benefits of cognitive-behavioral therapy with or without medication -Recommended to continue current medication  Tobacco use (Goal Quit Smoking) -Did not assess at this appt, will follow up at next visit  BPH (Goal: No symptoms) -Not ideally controlled -Current treatment  Terazosin 10mg  at bedtime -Medications previously tried: none noted  -Recommended to continue current medication -Patient reports having to go to bathroom every 3 hours and occasional leakage during the day -May consider benefit of adding oxybutynin or myrbetriq at future appt  Osteoarthritis (Goal: No pain) -Uncontrolled -Current treatment  Meloxicam 15mg  daily Baclofen 10mg  as needed Tylenol 650mg  as needed Naproxen 220mg  as needed -Medications previously tried: Tizanidine (gave hallucinations) -Counseled on diet and exercise extensively -Patient stated taking 4 naproxen at a time. Instructed him to stop doing that and to not take meloxicam if taking naproxen. Find which one works best for him. -Recommended patient begin taking scheduled tylenol 650mg  2 tabs every 8 hours to see if that benefits pain control   Obesity (Goal: Normal BMI) -Uncontrolled -Current treatment  Diet -Medications previously tried: none  -Counseled on diet and exercise extensively  Patient Goals/Self-Care Activities Patient will:  - take medications as prescribed as evidenced by patient report and record review check blood pressure once weekly, document, and provide at future appointments  Follow Up Plan: Face to Face appointment with care management team member scheduled for: 07/14/2021     Jimmy Norton was given information  about Chronic Care Management services today including:  CCM service includes personalized support from designated clinical staff supervised by his physician, including individualized plan of care and coordination with other care providers 24/7 contact phone numbers for assistance for urgent and routine care needs. Standard insurance, coinsurance, copays and deductibles apply for chronic care management only during months in which we provide at least 20 minutes of these services. Most insurances cover these services at 100%, however patients may be responsible for any copay, coinsurance and/or deductible if applicable. This service may help you avoid the need for more expensive face-to-face services. Only one practitioner may furnish and bill the service in a calendar month. The patient may stop CCM services at any time (effective at the end of the month) by phone call to the office staff.  Patient agreed to services and verbal consent obtained.   The patient verbalized understanding of instructions, educational materials, and care plan provided today and agreed to receive a mailed copy of patient instructions, educational materials, and care plan.  Face to Face appointment with pharmacist scheduled for: 07/14/2021  Alena Bills, [facility]  Clinical Pharmacist [phone]

## 2021-06-16 ENCOUNTER — Telehealth: Payer: Self-pay | Admitting: Student-PharmD

## 2021-06-16 NOTE — Progress Notes (Signed)
  Chronic Care Management Pharmacy Assistant   Name: Jimmy Norton.  MRN: [id number] DOB: August 26, 1945  Reason for Encounter: CCM Care Plan   Medications: Outpatient Encounter Medications as of 06/16/2021  Medication Sig   BACLOFEN PO Take 10 mg by mouth daily as needed.   cholecalciferol (VITAMIN D) 400 UNITS TABS tablet Take 400 Units by mouth daily.   Cyanocobalamin (B-12) 2500 MCG TABS Take 1 tablet by mouth daily.   FLUoxetine (PROZAC) 20 MG capsule Take 1 capsule (20 mg total) by mouth daily.   glucosamine-chondroitin 500-400 MG tablet Take 1 tablet by mouth 2 (two) times daily.   losartan (COZAAR) 25 MG tablet Take 1 tablet (25 mg total) by mouth daily.   meloxicam (MOBIC) 15 MG tablet Take 1 tablet (15 mg total) by mouth daily.   Misc. Devices MISC cpap   Multiple Vitamins-Minerals (MULTIVITAMIN WITH MINERALS) tablet Take 1 tablet by mouth daily. Reported on 09/11/2015   Probiotic Product (PROBIOTIC-10 PO) Take by mouth.   Pyridoxine HCl (B-6) 100 MG TABS Take by mouth.   rosuvastatin (CRESTOR) 5 MG tablet Take 1 tablet (5 mg total) by mouth daily.   Sod Picosulfate-Mag Ox-Cit Acd (CLENPIQ) 10-3.5-12 MG-GM -GM/160ML SOLN Take 1 kit by mouth as directed. At 5 PM evening before procedure, drink 1 bottle of Clenpiq, hydrate, drink (5) 8 oz of water. Then do the same thing 5 hours prior to your procedure.   terazosin (HYTRIN) 10 MG capsule Take 1 capsule (10 mg total) by mouth at bedtime.   tiZANidine (ZANAFLEX) 4 MG tablet Take 1 tablet (4 mg total) by mouth at bedtime as needed for muscle spasms. (Patient not taking: Reported on 06/11/2021)   triamcinolone (KENALOG) 0.025 % ointment Apply 1 application topically 2 (two) times daily.   No facility-administered encounter medications on file as of 06/16/2021.   Reviewed the patients initial visit reinsured it was completed per the pharmacist Alena Bills request. Printed the CCM care plan. Mailed the patient CCM care plan to their  most recent address on file.  Follow-Up:Pharmacist Review  Charlann Lange, [place] Pharmacist Assistant [phone]

## 2021-06-21 DIAGNOSIS — M5416 Radiculopathy, lumbar region: Secondary | ICD-10-CM | POA: Diagnosis not present

## 2021-06-21 DIAGNOSIS — M6281 Muscle weakness (generalized): Secondary | ICD-10-CM | POA: Diagnosis not present

## 2021-06-21 DIAGNOSIS — M542 Cervicalgia: Secondary | ICD-10-CM | POA: Diagnosis not present

## 2021-06-23 DIAGNOSIS — I10 Essential (primary) hypertension: Secondary | ICD-10-CM

## 2021-06-23 DIAGNOSIS — N401 Enlarged prostate with lower urinary tract symptoms: Secondary | ICD-10-CM

## 2021-06-23 DIAGNOSIS — E782 Mixed hyperlipidemia: Secondary | ICD-10-CM

## 2021-06-24 DIAGNOSIS — M542 Cervicalgia: Secondary | ICD-10-CM | POA: Diagnosis not present

## 2021-06-29 DIAGNOSIS — M542 Cervicalgia: Secondary | ICD-10-CM | POA: Diagnosis not present

## 2021-07-01 DIAGNOSIS — M542 Cervicalgia: Secondary | ICD-10-CM | POA: Diagnosis not present

## 2021-07-06 DIAGNOSIS — M6281 Muscle weakness (generalized): Secondary | ICD-10-CM | POA: Diagnosis not present

## 2021-07-06 DIAGNOSIS — M542 Cervicalgia: Secondary | ICD-10-CM | POA: Diagnosis not present

## 2021-07-06 DIAGNOSIS — M5416 Radiculopathy, lumbar region: Secondary | ICD-10-CM | POA: Diagnosis not present

## 2021-07-08 ENCOUNTER — Other Ambulatory Visit: Payer: Self-pay

## 2021-07-08 ENCOUNTER — Ambulatory Visit: Payer: Medicare HMO | Admitting: Physician Assistant

## 2021-07-08 ENCOUNTER — Encounter: Payer: Self-pay | Admitting: Physician Assistant

## 2021-07-08 VITALS — BP 145/86 | HR 100 | Temp 98.4°F | Resp 16 | Ht 69.0 in | Wt 305.6 lb

## 2021-07-08 DIAGNOSIS — Z7189 Other specified counseling: Secondary | ICD-10-CM

## 2021-07-08 DIAGNOSIS — Z720 Tobacco use: Secondary | ICD-10-CM | POA: Diagnosis not present

## 2021-07-08 DIAGNOSIS — G4733 Obstructive sleep apnea (adult) (pediatric): Secondary | ICD-10-CM

## 2021-07-08 DIAGNOSIS — I10 Essential (primary) hypertension: Secondary | ICD-10-CM | POA: Diagnosis not present

## 2021-07-08 DIAGNOSIS — M6281 Muscle weakness (generalized): Secondary | ICD-10-CM | POA: Diagnosis not present

## 2021-07-08 DIAGNOSIS — M5416 Radiculopathy, lumbar region: Secondary | ICD-10-CM | POA: Diagnosis not present

## 2021-07-08 DIAGNOSIS — M542 Cervicalgia: Secondary | ICD-10-CM | POA: Diagnosis not present

## 2021-07-08 DIAGNOSIS — Z6841 Body Mass Index (BMI) 40.0 and over, adult: Secondary | ICD-10-CM | POA: Diagnosis not present

## 2021-07-08 NOTE — Progress Notes (Signed)
[facility],  [id number]  Pulmonary Sleep Medicine   Office Visit Note  Patient Name: Jimmy Norton. DOB: 1946-05-24 MRN [id number]  Date of Service: 07/14/2021  Complaints/HPI: Pt is here for routine pulmonary follow up for OSA on CPAP. His download has been reviewed and shows 93% compliance with an AHI controlled at 1.0/hr. Patient reports benefiting from PAP use and denies any SOB, headaches, or dryness. He has no questions or concerns today. He does admit that he chews tobacco daily and has for >50 years and therefore is not interested in quitting. Discussed being careful about napping/sleeping with chew in mouth.  ROS  General: (-) fever, (-) chills, (-) night sweats, (-) weakness Skin: (-) rashes, (-) itching,. Eyes: (-) visual changes, (-) redness, (-) itching. Nose and Sinuses: (-) nasal stuffiness or itchiness, (-) postnasal drip, (-) nosebleeds, (-) sinus trouble. Mouth and Throat: (-) sore throat, (-) hoarseness. Neck: (-) swollen glands, (-) enlarged thyroid, (-) neck pain. Respiratory: - cough, (-) bloody sputum, - shortness of breath, - wheezing. Cardiovascular: - ankle swelling, (-) chest pain. Lymphatic: (-) lymph node enlargement. Neurologic: (-) numbness, (-) tingling. Psychiatric: (-) anxiety, (-) depression   Current Medication: Outpatient Encounter Medications as of 07/08/2021  Medication Sig   BACLOFEN PO Take 10 mg by mouth daily as needed.   cholecalciferol (VITAMIN D) 400 UNITS TABS tablet Take 400 Units by mouth daily.   Cyanocobalamin (B-12) 2500 MCG TABS Take 1 tablet by mouth daily.   FLUoxetine (PROZAC) 20 MG capsule Take 1 capsule (20 mg total) by mouth daily.   glucosamine-chondroitin 500-400 MG tablet Take 1 tablet by mouth 2 (two) times daily.   losartan (COZAAR) 25 MG tablet Take 1 tablet (25 mg total) by mouth daily.   meloxicam (MOBIC) 15 MG tablet Take 1 tablet (15 mg total) by mouth daily.    Misc. Devices MISC cpap   Multiple Vitamins-Minerals (MULTIVITAMIN WITH MINERALS) tablet Take 1 tablet by mouth daily. Reported on 09/11/2015   Probiotic Product (PROBIOTIC-10 PO) Take by mouth.   Pyridoxine HCl (B-6) 100 MG TABS Take by mouth.   rosuvastatin (CRESTOR) 5 MG tablet Take 1 tablet (5 mg total) by mouth daily.   Sod Picosulfate-Mag Ox-Cit Acd (CLENPIQ) 10-3.5-12 MG-GM -GM/160ML SOLN Take 1 kit by mouth as directed. At 5 PM evening before procedure, drink 1 bottle of Clenpiq, hydrate, drink (5) 8 oz of water. Then do the same thing 5 hours prior to your procedure.   terazosin (HYTRIN) 10 MG capsule Take 1 capsule (10 mg total) by mouth at bedtime.   tiZANidine (ZANAFLEX) 4 MG tablet Take 1 tablet (4 mg total) by mouth at bedtime as needed for muscle spasms.   triamcinolone (KENALOG) 0.025 % ointment Apply 1 application topically 2 (two) times daily.   No facility-administered encounter medications on file as of 07/08/2021.    Surgical History: Past Surgical History:  Procedure Laterality Date   COLONOSCOPY WITH PROPOFOL N/A 05/11/2021   Procedure: COLONOSCOPY WITH PROPOFOL;  Surgeon: Jonathon Bellows, MD;  Location: [facility] ENDOSCOPY;  Service: Gastroenterology;  Laterality: N/A;   HAND SURGERY Bilateral    HERNIA REPAIR     x 2   NOSE SURGERY     REPLACEMENT TOTAL KNEE      Medical History: Past Medical History:  Diagnosis Date   Anxiety and depression    Frequency    Gallstones 03/05/2013   Hypertension    Kidney problem   May  2013   Mixed hyperlipidemia    OAB (overactive bladder)    Obstructive sleep apnea (adult) (pediatric)     Family History: Family History  Problem Relation Age of Onset   COPD Mother    Heart disease Mother    Cancer Father        Brain tumor   Lung cancer Paternal Uncle    Cancer - Other Maternal Grandmother        Back   Kidney disease Neg Hx    Prostate cancer Neg Hx     Social History: Social History   Socioeconomic History    Marital status: Married    Spouse name: Not on file   Number of children: Not on file   Years of education: Not on file   Highest education level: Not on file  Occupational History   Not on file  Tobacco Use   Smoking status: Some Days    Types: Cigars   Smokeless tobacco: Current    Types: Chew   Tobacco comments:    smokes cigars onces in a while  Vaping Use   Vaping Use: Never used  Substance and Sexual Activity   Alcohol use: No    Alcohol/week: 0.0 standard drinks   Drug use: No   Sexual activity: Not on file  Other Topics Concern   Not on file  Social History Narrative   Not on file   Social Determinants of Health   Financial Resource Strain: Low Risk    Difficulty of Paying Living Expenses: Not hard at all  Food Insecurity: Not on file  Transportation Needs: Not on file  Physical Activity: Not on file  Stress: Not on file  Social Connections: Not on file  Intimate Partner Violence: Not on file    Vital Signs: Blood pressure (!) 145/86, pulse 100, temperature 98.4 F (36.9 C), resp. rate 16, height 5' 9"  (1.753 m), weight (!) 305 lb 9.6 oz (138.6 kg), SpO2 93 %.  Examination: General Appearance: The patient is well-developed, well-nourished, and in no distress. Skin: Gross inspection of skin unremarkable. Head: normocephalic, no gross deformities. Eyes: no gross deformities noted. ENT: ears appear grossly normal no exudates. Neck: Supple. No thyromegaly. No LAD. Respiratory: Lungs clear to auscultation bilaterally. Cardiovascular: Normal S1 and S2 without murmur or rub. Extremities: No cyanosis. pulses are equal. Neurologic: Alert and oriented. No involuntary movements.  LABS: Recent Results (from the past 2160 hour(s))  HM COLONOSCOPY     Status: None   Collection Time: 05/11/21 12:00 AM  Result Value Ref Range   HM Colonoscopy See Report (in chart) See Report (in chart), Patient Reported  Surgical pathology     Status: None   Collection Time:  05/11/21 11:38 AM  Result Value Ref Range   SURGICAL PATHOLOGY      SURGICAL PATHOLOGY CASE: [id number] PATIENT: Jimmy Norton Surgical Pathology Report     Specimen Submitted: A. Colon polyp, ascending; cold snare  Clinical History: Colon cancer screening Z12.11.  Diverticulosis; colon polyp    DIAGNOSIS: A. COLON POLYP, ASCENDING; COLD SNARE: - SESSILE SERRATED POLYP. - NEGATIVE FOR DYSPLASIA AND MALIGNANCY.  GROSS DESCRIPTION: A. Labeled: Cold snare polyp ascending colon Received: Formalin Collection time: 11:38 AM on 05/11/2021 Placed into formalin time: 11:38 AM on 05/11/2021 Tissue fragment(s): Multiple Size: Aggregate, 0.8 x 0.3 x 0.2 cm Description: Received are 2 fragments of tan soft tissue admixed with intestinal debris.  The ratio of soft tissue to intestinal debris is 70: 30. Entirely  submitted in 1 cassette.  RB 05/11/2021   Final Diagnosis performed by Allena Napoleon, MD.   Electronically signed 05/12/2021 1:56:23PM The electronic signature indicates that the named Attending Pathologist has evaluated  the specimen Technical component performed at [facility], [address] [place], [place] [address] Lab: [phone] Dir: Rush Farmer, MD, MMM  Professional component performed at [facility], [facility], [place], [place], [place] [address] Lab: [phone] Dir: Kathi Simpers, MD     Radiology: No results found.  No results found.  No results found.    Assessment and Plan: Patient Active Problem List   Diagnosis Date Noted   Noncompliance with medication regimen 12/15/2020   Syncope 04/15/2020   Abrasion of left forearm 04/15/2020   Abrasion of right lower leg 04/15/2020   Laceration of left forearm without complication 28/36/6294   Encounter for general adult medical examination with abnormal findings 08/27/2019   Left knee pain 08/27/2019   Cervical stenosis of spinal canal 11/06/2018   Screening for prostate  cancer 07/30/2018   Dysuria 07/30/2018   Arthritis of carpometacarpal ([place]) joint of right thumb 07/02/2018   Primary generalized (osteo)arthritis 06/10/2018   Cervical disc disease with myelopathy 06/10/2018   Anxiety 06/08/2018   Depression 06/08/2018   BPH (benign prostatic hyperplasia) 06/08/2018   Neuropathy 06/08/2018   Morbid obesity with body mass index (BMI) of 40.0 to 44.9 in adult ([place]) 11/30/2017   Tick bite 11/27/2017   Plantar fasciitis 11/27/2017   Primary osteoarthritis of right elbow 11/27/2017   OSA on CPAP 08/24/2017   Mixed hyperlipidemia 07/28/2017   Allergic rhinitis due to pollen 07/28/2017   Hypoxemia 07/28/2017   Calculus of kidney 07/28/2017   Chest pain, unspecified 07/28/2017   Major depressive disorder, recurrent, moderate ([place]) 07/28/2017   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 Obstructive sleep apnea Continued excellent compliance  2. CPAP use counseling CPAP couseling-Discussed importance of adequate CPAP use as well as proper care and cleaning techniques of machine and all supplies.  3. Essential (primary) hypertension Slightly elevated in office, followed by PCP  4. Chewing tobacco use Discussed reducing use and at least avoiding use when potential for nap/sleep  5. Morbid obesity with BMI of 45.0-49.9, adult ([place]) Obesity Counseling: Had a lengthy discussion regarding patients BMI and weight issues. Patient was instructed on portion control as well as increased activity. Also discussed caloric restrictions with trying to maintain intake less than 2000 Kcal. Discussions were made in accordance with the 5As of weight management. Simple actions such as not  eating late and if able to, taking a walk is suggested.    General Counseling: I have discussed the findings of the evaluation and examination with Ezio.  I have also discussed any further diagnostic evaluation thatmay be needed or ordered today. Douglas verbalizes understanding of the findings of todays visit. We also reviewed his medications today and discussed drug interactions and side effects including but not limited excessive drowsiness and altered mental states. We also discussed that there is always a risk not just to him but also people around him. he has been encouraged to call the office with any questions or concerns that should arise related to todays visit.  No  orders of the defined types were placed in this encounter.    Time spent: 30  I have personally obtained a history, examined the patient, evaluated laboratory and imaging results, formulated the assessment and plan and placed orders. This patient was seen by Drema Dallas, PA-C in collaboration with Dr. Devona Konig as a part of collaborative care agreement.     Allyne Gee, MD [facility] Pulmonary and Critical Care Sleep medicine

## 2021-07-13 DIAGNOSIS — M542 Cervicalgia: Secondary | ICD-10-CM | POA: Diagnosis not present

## 2021-07-13 DIAGNOSIS — M5416 Radiculopathy, lumbar region: Secondary | ICD-10-CM | POA: Diagnosis not present

## 2021-07-13 DIAGNOSIS — M6281 Muscle weakness (generalized): Secondary | ICD-10-CM | POA: Diagnosis not present

## 2021-07-14 ENCOUNTER — Other Ambulatory Visit: Payer: Self-pay

## 2021-07-14 ENCOUNTER — Ambulatory Visit: Payer: Medicare HMO | Admitting: Student-PharmD

## 2021-07-14 DIAGNOSIS — M25562 Pain in left knee: Secondary | ICD-10-CM

## 2021-07-14 DIAGNOSIS — E782 Mixed hyperlipidemia: Secondary | ICD-10-CM

## 2021-07-14 DIAGNOSIS — I10 Essential (primary) hypertension: Secondary | ICD-10-CM

## 2021-07-14 DIAGNOSIS — F331 Major depressive disorder, recurrent, moderate: Secondary | ICD-10-CM

## 2021-07-14 NOTE — Patient Instructions (Signed)

## 2021-07-15 DIAGNOSIS — M5416 Radiculopathy, lumbar region: Secondary | ICD-10-CM | POA: Diagnosis not present

## 2021-07-15 DIAGNOSIS — M542 Cervicalgia: Secondary | ICD-10-CM | POA: Diagnosis not present

## 2021-07-15 DIAGNOSIS — M6281 Muscle weakness (generalized): Secondary | ICD-10-CM | POA: Diagnosis not present

## 2021-07-15 NOTE — Progress Notes (Signed)
Follow Up CCM Pharmacist Visit   Jimmy Norton, STATZER  [id number] 32 years, Male  DOB: 06-Mar-1946  M: [phone]  Summary: Patient continues to have chronic pain issues but states that physical rehab therapy is helping with neck. Patient continues to miss doses of medications, I stressed the importance of taking the medications as prescribed. BP is elevated, may be due to missed doses of losartan. Will continue to monitor for now while working on med adherence  Recommendations/Changes made from today's visit: Continue current medication therapy Continue to check blood pressure at least twice weekly and keep a log.  Plan: F/U in 5 months  Patient Chart Prep  Completed by Charlann Lange on 07/12/2021  Chronic Conditions Patient's Chronic Conditions: Depression, Chronic Pain, Hypertension (HTN), Hyperlipidemia  Doctor and Hospital Visits Were there PCP Visits since last visit with the Pharmacist?: Yes Visit #1: 07/08/21 Mylinda Latina, PA-C. For follow-up. No medication changes. Were there Specialist Visits since last visit with the Pharmacist?: Yes Visit #1: 11/128/22 Physical Therapy-[facility]. No more information given. Visit #2: 06/24/21 Physical Therapy-[facility]. No more information given. Visit #3: 06/29/21 Physical Therapy-[facility]. No more information given. Visit #4: 07/01/21 Physical Therapy-[facility]. No more information given. Visit #5: 07/06/21 Physical Therapy-[facility]. No more information given. Was there a Hospital Visit in last 30 days?: No Were there other Hospital Visits since last visit with the Pharmacist?: No  Medication Information Have there been any medication changes from PCP or Specialist since last visit with the Pharmacist?: No Are there any Medication adherence gaps (beyond 5 days past due)?: Yes Details: Rosuvastatin 5 mg 03/24/21, 09/23/20 90  DS Medication adherence rates for the STAR rating drugs:  Losartan 25 mg 07/06/21, 06/11/21 30 DS Rosuvastatin 5 mg 03/24/21, 09/23/20 90 DS List Patient's current Care Gaps: No current Care Gaps identified  Disease Assessments  Subjective Information Current BP: 145/86 Current HR: 100 taken on: 07/08/2021 Weight: 305lbs BMI: 45.13 Last GFR: 85 taken on: 08/27/2019 Why did the patient present?: CCM F/u visit Alcohol, tobacco, and illicit drug usage?: chew tobacco Factors that may affect medication adherence?: Pill burden, Low motivation Is Patient using [facility]?: No Name and location of Current pharmacy: [place] Current Rx insurance plan: [employer] Are meds synced by current pharmacy?: No Are meds delivered by current pharmacy?: No - delivery available but patient prefers to not use Would patient benefit from direct intervention of clinical lead in dispensing process to optimize clinical outcomes?: Yes Are [facility] pharmacy services available where patient lives?: Yes Is patient disadvantaged to use [facility]?: No [facility] services reviewed with patient and patient wishes to change pharmacy?: No Select reason patient declined to change pharmacies: Patient preference Does patient experience delays in picking up medications due to transportation concerns (getting to pharmacy)?: No Any additional demeanor/mood notes?: Patient presents with wife and is in a very pleasant mood.  Hypertension (HTN) Assess this condition today?: Yes Is patient able to obtain BP reading today?: Yes BP today is: 151/80 Heart Rate is: 98 Goal: <140/90 mmHG Hypertension Stage: Stage 2 (SBP >140 or DBP > 90) Is Patient checking BP at home?: Yes Patient home BP readings are ranging: 130-151/70-80s How often does patient miss taking their blood pressure medications?: occasionally Has patient experienced hypotension, dizziness, falls or bradycardia?: No Check present secondary  causes (below) for HTN: Obesity Does Patient use RPM device?: No BP RPM device: Does patient qualify?: No We  discussed: DASH diet:  following a diet emphasizing fruits and vegetables and low-fat dairy products along with whole grains, fish, poultry, and nuts. Reducing red meats and sugars., Smoking cessation, Targeting 150 minutes of aerobic activity per week Assessment:: Uncontrolled Drug: Losartan 25mg  1 tablet daily Assessment: Appropriate, Effective, Safe, Accessible Additional Info: Patient and wife report occasional missed doses. Counseled patient on med adherence and to continue to monitor BP at home. BP device does appear to be outdated. Patient expresses interest in RPM and will enroll once available. Plan to (other): Continue current medication therapy and home BP monitoring HC Follow up: 2 months Pharmacist Follow up: 11/2021  Depression Assess this condition today?: Yes Completing the PHQ-9 Questionnaire today?: No In your opinion, how do you feel your depression symptoms have been controlled over the past 3 months?: Improved Assessment:: Controlled Drug: Fluoxetine 20mg  1 capsule daily Assessment: Appropriate, Effective, Safe, Accessible Additional Info: Patient states that if he starts feeling down, he will jump up and find an activity to do. Is satisfied with current treatment. Plan to (other): Continue current medication therapy. HC Follow up: 2 months Pharmacist Follow up: 11/2021  Exercise, Diet and Non-Drug Coordination Needs Additional exercise counseling points. We discussed: targeting at least 151 minutes per week of moderate-intensity aerobic exercise. Additional diet counseling points. We discussed: key components of the DASH diet Discussed Non-Drug Care Coordination Needs: Yes Does Patient have Medication financial barriers?: No  Accountable Health Communities Health-Related Social Needs Screening Tool -  SDOH  What is your living situation today? (ref #1): I  have a steady place to live Think about the place you live. Do you have problems with any of the following? (ref #2): None of the above Within the past 12 months, you worried that your food would run out before you got money to buy more (ref #3): Never true Within the past 12 months, the food you bought just didn't last and you didn't have money to get more (ref #4): Never true In the past 12 months, has lack of reliable transportation kept you from medical appointments, meetings, work or from getting things needed for daily living? (ref #5): No In the past 12 months, has the electric, gas, oil, or water company threatened to shut off services in your home? (ref #6): No How often does anyone, including family and friends, physically hurt you? (ref #7): Never (1) How often does anyone, including family and friends, insult or talk down to you? (ref #8): Never (1) How often does anyone, including friends and family, threaten you with harm? (ref #9): Never (1) How often does anyone, including family and friends, scream or curse at you? (ref #10): Never (1)  COMPREHENSIVE CARE PLAN AND GOALS:     HYPERTENSION   OFFICE VISIT BLOOD PRESSURE:   151/80  HOME BLOOD PRESSURE MONITORING: Taking readings at least once a week  MY GOAL BLOOD PRESSURE: <140/90 mmHg  CURRENT MEDICATION AND DOSING:  Losartan 25mg  - take 1 tablet by mouth daily   BARRIERS TO ACHIEVING GOALS: Medication adherence   PLAN:  Continue current medications as prescribed. Establish routine for taking meds. Continue to check home blood pressure at least once to twice weekly     CHOLESTEROL   MOST RECENT LABS: 09/21/20  TOTAL CHOLESTEROL:  137  TRIGLYCERIDES: 259  HDL: 32  LDL:  63  CURRENT MEDICATION AND DOSING:  Rosuvastatin 5mg  - take 1 tablet by mouth daily   THE GOALS WE HAVE CHOSEN ARE:  Total  Cholesterol goal under 200, Triglycerides goal under 150, HDL goal above 40, LDL goal under 100    BARRIERS TO ACHIEVING GOALS:  Medication adherence  PLAN TO WORK ON THESE GOALS: Continue current medications as prescribed.       DEPRESSION  CURRENT MEDICATION AND DOSING:  Fluoxetine 20mg  - take 1 capsule by mouth daily   THE GOALS WE HAVE CHOSEN ARE: Continue to see improvement in depression symptoms.  BARRIERS TO ACHIEVING GOALS: Medication adherence   PLAN TO WORK ON THESE GOALS:  Continue current medications as prescribed.     HEALTHY HABITS (Diet and exercise)  CURRENT DIET/EXERCISE: No particular diet or exercise routine  THE GOALS WE HAVE CHOSEN ARE:   Stay active, engage in at least 150 minutes per week of moderate-intensity exercise such as brisk walking (15- to 20-minute mile) or something similar. Increase flexibility exercises, break up prolonged periods of sitting Increase seafood, lean meats, whole grains, legumes, nuts, fruits (for dessert), and vegetables. Limit salt intake, sugars, carbs, and red meat, refined and processed foods.  BARRIERS TO ACHIEVING GOALS:  Lack of motivation, chronic pain  PLAN TO WORK ON THESE GOALS:  Establish an exercise or activity routine around pain   ACTIVE MEDICATION LIST  MEDICATION DOSE DIRECTIONS CONDITION NOTES  Baclofen  10mg  1 tablet daily as needed Pain   Vitamin D 400 units 1 tablet daily  Supplement   B-12 2515mcg 1 tablet daily Supplement   Fluoxetine 20mg  1 capsule daily Depression   Glucosamine-Chondroitin 500-400mg  1 tablet daily Joints   Losartan  25mg  1 tablet daily Blood pressure   Meloxicam  15MG  1 tablet daily Pain   Multivitamin  1 tablet daily  Supplement   Probiotic  1 tablet daily Gut health   B-6 100mg  1 tablet daily Supplement   Rosuvastatin 5mg  1 tablet daily Cholesterol   Terazosin 10mg  1 capsule daily Prostate   Tizanidine 4mg  1 tablet daily as needed for spasms Pain    MEDICATION REVIEW  MEDICATION REVIEW CONDUCTED:   Yes DATE:  07/14/2021 BEST POSSIBLE MEDICATION HISTORY SOURCE:   Medical Records  PHARMACY [facility] VIALS OR PACKS:  Vials   ALLERGIES/INTOLERANCES   NAME OF MEDICATION  REACTION    No known drug allergies      CURRENT HEALTHCARE PROVIDER TEAM   PROVIDER/TEAM MEMBER  ROLE  PHONE NUMBER  COMMENTS    Clayborn Bigness Primary Care Provider [phone]    Alena Bills Clinical Pharmacist [phone]     Chronic Pain, Adult Chronic pain is a type of pain that lasts or keeps coming back for at least 3-6 months. You may have headaches, pain in the abdomen, or pain in other areas of the body. Chronic pain may be related to an illness, such as fibromyalgia or complex regional pain syndrome. Chronic pain may also be related to an injury or a health condition. Sometimes, the cause of chronic pain is not known. Chronic pain can make it hard for you to do daily activities. If not treated, chronic pain can lead to anxiety and depression. Treatment depends on the cause and severity of your pain. You may need to work with a pain specialist to come up with a treatment plan. The plan may include medicine, counseling, and physical therapy. Many people benefit from a combination of two or more types of treatment to control their pain. Follow these instructions at home: Medicines Take over-the-counter and prescription medicines only as told by your health care provider. Ask your health care  provider if the medicine prescribed to you: Requires you to avoid driving or using machinery. Can cause constipation. You may need to take these actions to prevent or treat constipation: Drink enough fluid to keep your urine pale yellow. Take over-the-counter or prescription medicines. Eat foods that are high in fiber, such as beans, whole grains, and fresh fruits and vegetables. Limit foods that are high in fat and processed sugars, such as fried or sweet foods. Treatment plan Follow your treatment plan as told by your health care provider. This may include: Gentle, regular exercise. Eating a healthy diet that includes foods such as  vegetables, fruits, fish, and lean meats. Cognitive or behavioral therapy that changes the way you think or act in response to the pain. This may help improve how you feel. Working with a physical therapist. Meditation, yoga, acupuncture, or massage therapy. Aroma, color, light, or sound therapy. Local electrical stimulation. The electrical pulses help to relieve pain by temporarily stopping the nerve impulses that cause you to feel pain. Injections. These deliver numbing or pain-relieving medicines into the spine or the area of pain.   Lifestyle Ask your health care provider whether you should keep a pain diary. Your health care provider will tell you what information to write in the diary. This may include when you have pain, what the pain feels like, and how medicines and other behaviors or treatments help to reduce the pain. Consider talking with a mental health care provider about how to manage chronic pain. Consider joining a chronic pain support group. Try to control or lower your stress levels. Talk with your health care provider about ways to do this. General instructions Learn as much as you can about how to manage your chronic pain. Ask your health care provider if an intensive pain rehabilitation program or a chronic pain specialist would be helpful. Check your pain level as told by your health care provider. Ask your health care provider if you should use a pain scale. It is up to you to get the results of any tests that were done. Ask your health care provider, or the department that is doing the tests, when your results will be ready. Keep all follow-up visits as told by your health care provider. This is important. Contact a health care provider if: Your pain gets worse, or you have new pain. You have trouble sleeping. You have trouble doing your normal activities. Your pain is not controlled with treatment. You have side effects from pain medicine. You feel weak. You notice  any other changes that show that your condition is getting worse. Get help right away if: You lose feeling or have numbness in your body. You lose control of bowel or bladder function. Your pain suddenly gets much worse. You develop shaking or chills. You develop confusion. You develop chest pain. You have trouble breathing or shortness of breath. You pass out. You have thoughts about hurting yourself or others. If you ever feel like you may hurt yourself or others, or have thoughts about taking your own life, get help right away. Go to your nearest emergency department or: Call your local emergency services (911 in the [place] Call a suicide crisis helpline, such as the [facility] at (608)667-8871 or 988 in the [place]. This is open 24 hours a day in the [place] Text the Crisis Text Line at (857)525-5285 (in the [place].). Summary Chronic pain is a type of pain that lasts or keeps coming back for at least  3-6 months. Chronic pain may be related to an illness, injury, or other health condition. Sometimes, the cause of chronic pain is not known. Treatment depends on the cause and severity of your pain. Many people benefit from a combination of two or more types of treatment to control their pain. Follow your treatment plan as told by your health care provider.   Alena Bills Clinical Pharmacist [phone]

## 2021-07-22 ENCOUNTER — Telehealth: Payer: Self-pay | Admitting: Student-PharmD

## 2021-07-22 NOTE — Progress Notes (Signed)
Chronic Care Management Pharmacy Assistant   Name: Jimmy Norton.  MRN: [id number] DOB: Nov 13, 1945   Reason for Encounter: CCM Care Plan  Medications: Outpatient Encounter Medications as of 07/22/2021  Medication Sig   BACLOFEN PO Take 10 mg by mouth daily as needed.   cholecalciferol (VITAMIN D) 400 UNITS TABS tablet Take 400 Units by mouth daily.   Cyanocobalamin (B-12) 2500 MCG TABS Take 1 tablet by mouth daily.   FLUoxetine (PROZAC) 20 MG capsule Take 1 capsule (20 mg total) by mouth daily.   glucosamine-chondroitin 500-400 MG tablet Take 1 tablet by mouth 2 (two) times daily.   losartan (COZAAR) 25 MG tablet Take 1 tablet (25 mg total) by mouth daily.   meloxicam (MOBIC) 15 MG tablet Take 1 tablet (15 mg total) by mouth daily.   Misc. Devices MISC cpap   Multiple Vitamins-Minerals (MULTIVITAMIN WITH MINERALS) tablet Take 1 tablet by mouth daily. Reported on 09/11/2015   Probiotic Product (PROBIOTIC-10 PO) Take by mouth.   Pyridoxine HCl (B-6) 100 MG TABS Take by mouth.   rosuvastatin (CRESTOR) 5 MG tablet Take 1 tablet (5 mg total) by mouth daily.   Sod Picosulfate-Mag Ox-Cit Acd (CLENPIQ) 10-3.5-12 MG-GM -GM/160ML SOLN Take 1 kit by mouth as directed. At 5 PM evening before procedure, drink 1 bottle of Clenpiq, hydrate, drink (5) 8 oz of water. Then do the same thing 5 hours prior to your procedure.   terazosin (HYTRIN) 10 MG capsule Take 1 capsule (10 mg total) by mouth at bedtime.   tiZANidine (ZANAFLEX) 4 MG tablet Take 1 tablet (4 mg total) by mouth at bedtime as needed for muscle spasms.   triamcinolone (KENALOG) 0.025 % ointment Apply 1 application topically 2 (two) times daily.   No facility-administered encounter medications on file as of 07/22/2021.    Reviewed the patients visit reinsured it was completed per the pharmacist Alena Bills request. Printed the CCM care plan. Mailed the patient CCM care plan to their most recent address on file.  Follow-Up:Pharmacist  Review  Charlann Lange, [place] Pharmacist Assistant [phone]

## 2021-07-23 DIAGNOSIS — N401 Enlarged prostate with lower urinary tract symptoms: Secondary | ICD-10-CM | POA: Diagnosis not present

## 2021-07-23 DIAGNOSIS — I10 Essential (primary) hypertension: Secondary | ICD-10-CM | POA: Diagnosis not present

## 2021-07-23 DIAGNOSIS — E782 Mixed hyperlipidemia: Secondary | ICD-10-CM | POA: Diagnosis not present

## 2021-07-30 DIAGNOSIS — M5030 Other cervical disc degeneration, unspecified cervical region: Secondary | ICD-10-CM | POA: Diagnosis not present

## 2021-07-30 DIAGNOSIS — M5412 Radiculopathy, cervical region: Secondary | ICD-10-CM | POA: Diagnosis not present

## 2021-07-30 DIAGNOSIS — M4802 Spinal stenosis, cervical region: Secondary | ICD-10-CM | POA: Diagnosis not present

## 2021-08-08 DIAGNOSIS — G4733 Obstructive sleep apnea (adult) (pediatric): Secondary | ICD-10-CM | POA: Diagnosis not present

## 2021-08-18 ENCOUNTER — Telehealth: Payer: Self-pay | Admitting: Student-PharmD

## 2021-08-18 NOTE — Progress Notes (Addendum)
General Review Call   VINEET, KINNEY  [id number] 89 years, Male  DOB: 09-24-1945  M: [phone]  General Review [facility]) Completed by Charlann Lange on 08/18/2021  Chart Review What recent interventions/DTPs have been made by any provider to improve the patient's conditions in the last 3 months?: None.  Any recent hospitalizations or ED visits since last visit with CPP?: No  Adherence Review Adherence rates for STAR metric medications: Losartan 25 mg 07/06/21 30 DS Rosuvastatin 5 mg 07/13/21 90 DS  Adherence rates for medications indicated for disease state being reviewed:  Losartan 25 mg 07/06/21 30 DS  Does the patient have >5 day gap between last estimated fill dates for any of the above medications?: Yes  Reasons for medication gaps: Losartan 25 mg 07/06/21 30 DS- Patients wife stated they refilled this medication on 07/28/21.  Disease State Questions Able to connect with the Patient?: Yes  Did patient have any problems with their health recently?: No  Did patient have any problems with their pharmacy?: No  Does patient have any issues or side effects with their medications?: No  Additional  information to pass to Patient's CPP?: Yes  Note additional concerns and questions from Patient: Patients wife stated he is having knee pain but he is going to see a doctor in regards of this very soon.  Anything we can do to help take better care of Patient?: No  Charlann Lange, [place]  [phone]  Pharmacist Review  Adherence gaps identified?: No Drug Therapy Problems identified?: No Assessment: Controlled  Alena Bills Clinical Pharmacist [phone]

## 2021-08-22 DIAGNOSIS — N401 Enlarged prostate with lower urinary tract symptoms: Secondary | ICD-10-CM | POA: Diagnosis not present

## 2021-08-22 DIAGNOSIS — E782 Mixed hyperlipidemia: Secondary | ICD-10-CM | POA: Diagnosis not present

## 2021-08-22 DIAGNOSIS — I10 Essential (primary) hypertension: Secondary | ICD-10-CM | POA: Diagnosis not present

## 2021-08-24 DIAGNOSIS — Z96651 Presence of right artificial knee joint: Secondary | ICD-10-CM | POA: Diagnosis not present

## 2021-08-24 DIAGNOSIS — I700 Atherosclerosis of aorta: Secondary | ICD-10-CM | POA: Diagnosis not present

## 2021-08-24 DIAGNOSIS — M1712 Unilateral primary osteoarthritis, left knee: Secondary | ICD-10-CM | POA: Diagnosis not present

## 2021-08-24 DIAGNOSIS — Z6841 Body Mass Index (BMI) 40.0 and over, adult: Secondary | ICD-10-CM | POA: Diagnosis not present

## 2021-08-29 DIAGNOSIS — I700 Atherosclerosis of aorta: Secondary | ICD-10-CM | POA: Insufficient documentation

## 2021-08-29 DIAGNOSIS — H903 Sensorineural hearing loss, bilateral: Secondary | ICD-10-CM | POA: Insufficient documentation

## 2021-08-29 DIAGNOSIS — H2513 Age-related nuclear cataract, bilateral: Secondary | ICD-10-CM | POA: Insufficient documentation

## 2021-08-29 DIAGNOSIS — Z7729 Contact with and (suspected ) exposure to other hazardous substances: Secondary | ICD-10-CM | POA: Insufficient documentation

## 2021-08-30 DIAGNOSIS — J019 Acute sinusitis, unspecified: Secondary | ICD-10-CM | POA: Diagnosis not present

## 2021-08-30 DIAGNOSIS — R059 Cough, unspecified: Secondary | ICD-10-CM | POA: Diagnosis not present

## 2021-08-30 DIAGNOSIS — Z03818 Encounter for observation for suspected exposure to other biological agents ruled out: Secondary | ICD-10-CM | POA: Diagnosis not present

## 2021-09-02 ENCOUNTER — Other Ambulatory Visit: Payer: Self-pay

## 2021-09-02 DIAGNOSIS — N401 Enlarged prostate with lower urinary tract symptoms: Secondary | ICD-10-CM

## 2021-09-02 MED ORDER — TERAZOSIN HCL 10 MG PO CAPS: 10.0000 mg | ORAL_CAPSULE | Freq: Every day | ORAL | 3 refills | Status: DC

## 2021-09-06 DIAGNOSIS — B351 Tinea unguium: Secondary | ICD-10-CM | POA: Diagnosis not present

## 2021-09-06 DIAGNOSIS — M778 Other enthesopathies, not elsewhere classified: Secondary | ICD-10-CM | POA: Diagnosis not present

## 2021-09-06 DIAGNOSIS — L909 Atrophic disorder of skin, unspecified: Secondary | ICD-10-CM | POA: Diagnosis not present

## 2021-09-06 DIAGNOSIS — L600 Ingrowing nail: Secondary | ICD-10-CM | POA: Diagnosis not present

## 2021-09-06 DIAGNOSIS — M79675 Pain in left toe(s): Secondary | ICD-10-CM | POA: Diagnosis not present

## 2021-09-06 DIAGNOSIS — M79674 Pain in right toe(s): Secondary | ICD-10-CM | POA: Diagnosis not present

## 2021-09-08 DIAGNOSIS — G4733 Obstructive sleep apnea (adult) (pediatric): Secondary | ICD-10-CM | POA: Diagnosis not present

## 2021-09-13 DIAGNOSIS — R202 Paresthesia of skin: Secondary | ICD-10-CM | POA: Diagnosis not present

## 2021-09-13 DIAGNOSIS — R200 Anesthesia of skin: Secondary | ICD-10-CM | POA: Diagnosis not present

## 2021-09-17 ENCOUNTER — Telehealth: Payer: Self-pay | Admitting: Student-PharmD

## 2021-09-17 NOTE — Progress Notes (Addendum)
Hypertension (HTN) Review Call   Jimmy Norton, Jimmy Norton [id number] 86 years, Male  DOB: 1945/10/03  M: [phone]  Hypertension Review (HC) Completed by Charlann Lange on 09/17/2021  Chart Review Is the patient enrolled in RPM with BP Monitor?: No  BP #1 reading (last): 145/86 on: 07/08/2021  BP #2 reading: 124/75 on: 06/03/2021  BP #3 reading: 117/66 on: 05/27/2021  Any of the last 3 BP > 140/90 mmHg?: Yes  What recent interventions have been made by any provider to improve the patient's conditions in the last 3 months?:  Consults:  08/30/21 [facility], Delma Officer, DO. For cough/body aches. STARTED minocycline 100 mg 1 cap by mouth 3 times daily for 10 days. 09/06/21 Podiatry Yvetta Coder, DPM. For nail problem. No medication changes.  Any recent hospitalizations or ED visits since last visit with CPP?: No  Adherence Review Adherence rates for STAR metric medications:  Losartan 25 mg 07/28/21 30 DS Rosuvastatin 5 mg 07/13/21 90 DS  Adherence rates for medications indicated for disease state being reviewed: Losartan 25 mg 07/28/21 30 DS  Does the patient have >5 day gap between last estimated fill dates for any of the above medications?: No  Disease State Questions Able to connect with the Patient?: Yes  Is the patient monitoring his/her BP?: Yes  How often are you checking your BP?: occasionally  Home BP Reading #1 (most recent): 139/78  Is the patient having any low BP Readings <90/60?: No  Is the patient having any BP readings above >180/100?: No  Is the patient's average BP>140/90?: No  What is your blood pressure goal?: 120/80  Educate patient to inform proper points on checking BP at home:: Do not drink caffeine or smoke a cigarette at least 30 min. prior to checking., Make sure using the right size cuff, the length of the cuffs bladder should be at least equal to 75% of the circumference of the upper arm.  What diet changes have you  made to improve your Blood Pressure Control?: eating more home-cooked meals, eating more fruits and vegetables  What exercise are you doing to improve your Blood Pressure Control?: no formal exercise  Engagement Notes Charlann Lange on 09/02/2021 09:49 AM HC Chart Review: 12 min 09/02/21 13 min 09/17/21  Pharmacist Review Adherence gaps identified?: No Drug Therapy Problems identified?: No Assessment: Controlled  Alena Bills Clinical Pharmacist

## 2021-09-21 ENCOUNTER — Telehealth: Payer: Self-pay

## 2021-09-21 DIAGNOSIS — N401 Enlarged prostate with lower urinary tract symptoms: Secondary | ICD-10-CM

## 2021-09-21 DIAGNOSIS — I10 Essential (primary) hypertension: Secondary | ICD-10-CM

## 2021-09-21 DIAGNOSIS — E782 Mixed hyperlipidemia: Secondary | ICD-10-CM

## 2021-09-21 NOTE — Telephone Encounter (Signed)
Left vm to confirm 09/23/21 appointment-Jimmy Norton

## 2021-09-23 ENCOUNTER — Ambulatory Visit (INDEPENDENT_AMBULATORY_CARE_PROVIDER_SITE_OTHER): Payer: Medicare HMO | Admitting: Nurse Practitioner

## 2021-09-23 ENCOUNTER — Other Ambulatory Visit: Payer: Self-pay

## 2021-09-23 ENCOUNTER — Encounter: Payer: Self-pay | Admitting: Nurse Practitioner

## 2021-09-23 VITALS — BP 132/70 | HR 72 | Temp 98.1°F | Resp 16 | Ht 69.0 in | Wt 302.0 lb

## 2021-09-23 DIAGNOSIS — R0602 Shortness of breath: Secondary | ICD-10-CM | POA: Diagnosis not present

## 2021-09-23 DIAGNOSIS — I700 Atherosclerosis of aorta: Secondary | ICD-10-CM

## 2021-09-23 DIAGNOSIS — F331 Major depressive disorder, recurrent, moderate: Secondary | ICD-10-CM

## 2021-09-23 DIAGNOSIS — N401 Enlarged prostate with lower urinary tract symptoms: Secondary | ICD-10-CM

## 2021-09-23 DIAGNOSIS — Z125 Encounter for screening for malignant neoplasm of prostate: Secondary | ICD-10-CM | POA: Diagnosis not present

## 2021-09-23 DIAGNOSIS — Z9989 Dependence on other enabling machines and devices: Secondary | ICD-10-CM | POA: Diagnosis not present

## 2021-09-23 DIAGNOSIS — G629 Polyneuropathy, unspecified: Secondary | ICD-10-CM | POA: Diagnosis not present

## 2021-09-23 DIAGNOSIS — Z6841 Body Mass Index (BMI) 40.0 and over, adult: Secondary | ICD-10-CM | POA: Diagnosis not present

## 2021-09-23 DIAGNOSIS — I10 Essential (primary) hypertension: Secondary | ICD-10-CM | POA: Diagnosis not present

## 2021-09-23 DIAGNOSIS — R300 Dysuria: Secondary | ICD-10-CM | POA: Diagnosis not present

## 2021-09-23 DIAGNOSIS — D649 Anemia, unspecified: Secondary | ICD-10-CM | POA: Diagnosis not present

## 2021-09-23 DIAGNOSIS — Z0001 Encounter for general adult medical examination with abnormal findings: Secondary | ICD-10-CM

## 2021-09-23 DIAGNOSIS — M150 Primary generalized (osteo)arthritis: Secondary | ICD-10-CM

## 2021-09-23 DIAGNOSIS — E559 Vitamin D deficiency, unspecified: Secondary | ICD-10-CM | POA: Diagnosis not present

## 2021-09-23 DIAGNOSIS — E538 Deficiency of other specified B group vitamins: Secondary | ICD-10-CM | POA: Diagnosis not present

## 2021-09-23 DIAGNOSIS — G4733 Obstructive sleep apnea (adult) (pediatric): Secondary | ICD-10-CM | POA: Diagnosis not present

## 2021-09-23 DIAGNOSIS — R69 Illness, unspecified: Secondary | ICD-10-CM | POA: Diagnosis not present

## 2021-09-23 MED ORDER — LOSARTAN POTASSIUM 25 MG PO TABS: 25.0000 mg | ORAL_TABLET | Freq: Every day | ORAL | 3 refills | Status: DC

## 2021-09-23 MED ORDER — FLUOXETINE HCL 20 MG PO CAPS: 20.0000 mg | ORAL_CAPSULE | Freq: Every day | ORAL | 3 refills | Status: DC

## 2021-09-23 MED ORDER — MELOXICAM 15 MG PO TABS: 15.0000 mg | ORAL_TABLET | Freq: Every day | ORAL | 3 refills | Status: DC

## 2021-09-23 MED ORDER — TERAZOSIN HCL 10 MG PO CAPS: 10.0000 mg | ORAL_CAPSULE | Freq: Every day | ORAL | 3 refills | Status: DC

## 2021-09-23 NOTE — Progress Notes (Incomplete)
[facility] [place], [place] [address]  Internal MEDICINE  Office Visit Note  Patient Name: Jimmy Norton  [id number]  [id number]  Date of Service: 09/23/2021  Chief Complaint  Patient presents with   Medicare Wellness    SOB, will be having knee surgery in may   Depression   Hyperlipidemia   Hypertension   Anxiety    HPI Jimmy Norton presents for an annual well visit and physical exam.  He is a well-appearing 76 year old male with hypertension, neuropathy, osteoarthritis and obstructive sleep apnea.  He has previously had the right knee replacement done by Dr. Marry Guan.  He is scheduled to have surgery for a left knee replacement on May 1 later this year.  He is due for routine labs.  He had a colonoscopy last year.  He is currently on fluoxetine for depression and anxiety and has previously mentioned and that he has seen a therapist routinely in the past and has mentioned being interested in starting that back up again possibly.  Patient reports that he is okay right now and has been trying to get out more and talk to friends of his more regularly and his wife has been home more to so he has decided that he does not want to see a therapist right now.  He reports that the current dose of fluoxetine at 20 mg is effective for him.  He reports that he has been doing his best to remember to take his medication every day and he is compliant most of the time.  He make sure that he takes his blood pressure medications, rosuvastatin and fluoxetine every day.  His blood pressure is well controlled at this time. -age appropriate screenings and immunizations as appropriate.  He has reported having increased shortness of breath with minimal exertion and his last echocardiogram was in 2021.  He is also due for refills of his medications.   Current Medication: Outpatient Encounter Medications as of 09/23/2021  Medication Sig   cholecalciferol (VITAMIN D) 400 UNITS TABS tablet  Take 400 Units by mouth daily.   Cyanocobalamin (B-12) 2500 MCG TABS Take 1 tablet by mouth daily.   glucosamine-chondroitin 500-400 MG tablet Take 1 tablet by mouth 2 (two) times daily.   Misc. Devices MISC cpap   Multiple Vitamins-Minerals (MULTIVITAMIN WITH MINERALS) tablet Take 1 tablet by mouth daily. Reported on 09/11/2015   Probiotic Product (PROBIOTIC-10 PO) Take by mouth.   Pyridoxine HCl (B-6) 100 MG TABS Take by mouth.   rosuvastatin (CRESTOR) 5 MG tablet Take 1 tablet (5 mg total) by mouth daily.   [DISCONTINUED] FLUoxetine (PROZAC) 20 MG capsule Take 1 capsule (20 mg total) by mouth daily.   [DISCONTINUED] losartan (COZAAR) 25 MG tablet Take 1 tablet (25 mg total) by mouth daily.   [DISCONTINUED] meloxicam (MOBIC) 15 MG tablet Take 1 tablet (15 mg total) by mouth daily.   [DISCONTINUED] terazosin (HYTRIN) 10 MG capsule Take 1 capsule (10 mg total) by mouth at bedtime.   FLUoxetine (PROZAC) 20 MG capsule Take 1 capsule (20 mg total) by mouth daily.   losartan (COZAAR) 25 MG tablet Take 1 tablet (25 mg total) by mouth daily.   meloxicam (MOBIC) 15 MG tablet Take 1 tablet (15 mg total) by mouth daily.   terazosin (HYTRIN) 10 MG capsule Take 1 capsule (10 mg total) by mouth at bedtime.   [DISCONTINUED] BACLOFEN PO Take 10 mg by mouth daily as needed. (Patient not taking: Reported on 09/23/2021)   [  DISCONTINUED] Sod Picosulfate-Mag Ox-Cit Acd (CLENPIQ) 10-3.5-12 MG-GM -GM/160ML SOLN Take 1 kit by mouth as directed. At 5 PM evening before procedure, drink 1 bottle of Clenpiq, hydrate, drink (5) 8 oz of water. Then do the same thing 5 hours prior to your procedure. (Patient not taking: Reported on 09/23/2021)   [DISCONTINUED] tiZANidine (ZANAFLEX) 4 MG tablet Take 1 tablet (4 mg total) by mouth at bedtime as needed for muscle spasms. (Patient not taking: Reported on 09/23/2021)   [DISCONTINUED] triamcinolone (KENALOG) 0.025 % ointment Apply 1 application topically 2 (two) times  daily. (Patient not taking: Reported on 09/23/2021)   No facility-administered encounter medications on file as of 09/23/2021.    Surgical History: Past Surgical History:  Procedure Laterality Date   COLONOSCOPY WITH PROPOFOL N/A 05/11/2021   Procedure: COLONOSCOPY WITH PROPOFOL;  Surgeon: Jonathon Bellows, MD;  Location: [facility] ENDOSCOPY;  Service: Gastroenterology;  Laterality: N/A;   HAND SURGERY Bilateral    HERNIA REPAIR     x 2   NOSE SURGERY     REPLACEMENT TOTAL KNEE      Medical History: Past Medical History:  Diagnosis Date   Anxiety and depression    Frequency    Gallstones 03/05/2013   Hypertension    Kidney problem   May  2013   Mixed hyperlipidemia    OAB (overactive bladder)    Obstructive sleep apnea (adult) (pediatric)     Family History: Family History  Problem Relation Age of Onset   COPD Mother    Heart disease Mother    Cancer Father        Brain tumor   Lung cancer Paternal Uncle    Cancer - Other Maternal Grandmother        Back   Kidney disease Neg Hx    Prostate cancer Neg Hx     Social History   Socioeconomic History   Marital status: Married    Spouse name: Not on file   Number of children: Not on file   Years of education: Not on file   Highest education level: Not on file  Occupational History   Not on file  Tobacco Use   Smoking status: Some Days    Types: Cigars   Smokeless tobacco: Current    Types: Chew   Tobacco comments:    smokes cigars onces in a while  Vaping Use   Vaping Use: Never used  Substance and Sexual Activity   Alcohol use: No    Alcohol/week: 0.0 standard drinks   Drug use: No   Sexual activity: Not on file  Other Topics Concern   Not on file  Social History Narrative   Not on file   Social Determinants of Health   Financial Resource Strain: Low Risk    Difficulty of Paying Living Expenses: Not hard at all  Food Insecurity: Not on file  Transportation Needs: Not on file   Physical Activity: Not on file  Stress: Not on file  Social Connections: Not on file  Intimate Partner Violence: Not on file      Review of Systems  Constitutional:  Negative for activity change, appetite change, chills, fatigue, fever and unexpected weight change.  HENT: Negative.  Negative for congestion, ear pain, rhinorrhea, sore throat and trouble swallowing.   Eyes: Negative.   Respiratory:  Positive for shortness of breath (with minimal exertion). Negative for cough, chest tightness and wheezing.   Cardiovascular: Negative.  Negative for chest pain and palpitations.  Gastrointestinal: Negative.  Negative for abdominal pain, blood in stool, constipation, diarrhea, nausea and vomiting.  Endocrine: Negative.   Genitourinary: Negative.  Negative for difficulty urinating, dysuria, frequency, hematuria and urgency.  Musculoskeletal: Negative.  Negative for arthralgias, back pain, joint swelling, myalgias and neck pain.  Skin: Negative.  Negative for rash and wound.  Allergic/Immunologic: Negative.  Negative for immunocompromised state.  Neurological: Negative.  Negative for dizziness, seizures, numbness and headaches.  Hematological: Negative.   Psychiatric/Behavioral: Negative.  Negative for behavioral problems, self-injury and suicidal ideas. The patient is not nervous/anxious.    Vital Signs: BP 132/70    Pulse 72    Temp 98.1 F (36.7 C)    Resp 16    Ht _0  (1.753 m)    Wt (!) 302 lb (137 kg)    SpO2 97%    BMI 44.60 kg/m    Physical Exam Vitals reviewed.  Constitutional:      General: He is awake. He is not in acute distress.    Appearance: Normal appearance. He is well-developed and well-groomed. He is morbidly obese. He is not ill-appearing or diaphoretic.  HENT:     Head: Normocephalic and atraumatic.     Right Ear: Tympanic membrane, ear canal and external ear normal.     Left Ear: Tympanic membrane, ear canal and external ear normal.     Ears:     Comments:  Wears bilateral hearing aids    Nose: Nose normal. No congestion or rhinorrhea.     Mouth/Throat:     Lips: Pink.     Mouth: Mucous membranes are moist.     Pharynx: Oropharynx is clear. Uvula midline. No oropharyngeal exudate or posterior oropharyngeal erythema.  Eyes:     General: Lids are normal. Vision grossly intact. Gaze aligned appropriately. No scleral icterus.       Right eye: No discharge.        Left eye: No discharge.     Extraocular Movements: Extraocular movements intact.     Conjunctiva/sclera: Conjunctivae normal.     Pupils: Pupils are equal, round, and reactive to light.     Funduscopic exam:    Right eye: Red reflex present.        Left eye: Red reflex present. Neck:     Thyroid: No thyromegaly.     Vascular: No JVD.     Trachea: Trachea and phonation normal. No tracheal deviation.  Cardiovascular:     Rate and Rhythm: Normal rate and regular rhythm.     Pulses: Normal pulses.     Heart sounds: Normal heart sounds, S1 normal and S2 normal. No murmur heard.   No friction rub. No gallop.  Pulmonary:     Effort: Pulmonary effort is normal. No accessory muscle usage or respiratory distress.     Breath sounds: Normal breath sounds and air entry. No stridor. No wheezing or rales.  Chest:     Chest wall: No tenderness.  Abdominal:     General: Bowel sounds are normal. There is no distension.     Palpations: Abdomen is soft. There is no mass.     Tenderness: There is no abdominal tenderness. There is no guarding or rebound.  Musculoskeletal:        General: No tenderness or deformity. Normal range of motion.     Cervical back: Normal range of motion and neck supple.     Right lower leg: 1+ Pitting Edema present.     Left lower leg: 1+ Pitting Edema present.  Lymphadenopathy:     Cervical: No cervical adenopathy.  Skin:    General: Skin is warm and dry.     Capillary Refill: Capillary refill takes less than 2 seconds.     Coloration: Skin is not pale.      Findings: No erythema or rash.  Neurological:     Mental Status: He is alert and oriented to person, place, and time.     Cranial Nerves: No cranial nerve deficit.     Motor: No abnormal muscle tone.     Coordination: Coordination normal.     Gait: Gait normal.     Deep Tendon Reflexes: Reflexes are normal and symmetric.  Psychiatric:        Mood and Affect: Mood normal.        Behavior: Behavior is cooperative.        Thought Content: Thought content normal.        Judgment: Judgment normal.       Assessment/Plan: 1. Encounter for general adult medical examination with abnormal findings ***  2. Essential (primary) hypertension *** - losartan (COZAAR) 25 MG tablet; Take 1 tablet (25 mg total) by mouth daily.  Dispense: 90 tablet; Refill: 3 - CBC with Differential/Platelet - CMP14+EGFR  3. SOBOE (shortness of breath on exertion) *** - CBC with Differential/Platelet - CMP14+EGFR - B12 and Folate Panel - Pro b natriuretic peptide (BNP)[facility]) - ECHOCARDIOGRAM COMPLETE; Future  4. Atherosclerosis of aorta (HCC) *** - CBC with Differential/Platelet - Lipid Profile - TSH + free T4  5. Primary generalized (osteo)arthritis *** - meloxicam (MOBIC) 15 MG tablet; Take 1 tablet (15 mg total) by mouth daily.  Dispense: 90 tablet; Refill: 3  6. Benign prostatic hyperplasia with lower urinary tract symptoms, symptom details unspecified *** - terazosin (HYTRIN) 10 MG capsule; Take 1 capsule (10 mg total) by mouth at bedtime.  Dispense: 90 capsule; Refill: 3  7. Neuropathy *** - CMP14+EGFR - TSH + free T4  8. B12 deficiency *** - B12 and Folate Panel  9. Anemia, unspecified type *** - CBC with Differential/Platelet - B12 and Folate Panel  10. Vitamin D deficiency *** - Vitamin D (25 hydroxy)  11. Encounter for prostate cancer screening *** - PSA Total (Reflex To Free)  12. Morbid obesity with BMI of 45.0-49.9, adult (HCC) *** - TSH +  free T4  13. Major depressive disorder, recurrent, moderate (HCC) *** - FLUoxetine (PROZAC) 20 MG capsule; Take 1 capsule (20 mg total) by mouth daily.  Dispense: 90 capsule; Refill: 3      General Counseling: Emilian verbalizes understanding of the findings of todays visit and agrees with plan of treatment. I have discussed any further diagnostic evaluation that may be needed or ordered today. We also reviewed his medications today. he has been encouraged to call the office with any questions or concerns that should arise related to todays visit.    Orders Placed This Encounter  Procedures   CBC with Differential/Platelet   CMP14+EGFR   B12 and Folate Panel   Lipid Profile   PSA Total (Reflex To Free)   TSH + free T4   Vitamin D (25 hydroxy)   Pro b natriuretic peptide (BNP)[facility])   ECHOCARDIOGRAM COMPLETE    Meds ordered this encounter  Medications   losartan (COZAAR) 25 MG tablet    Sig: Take 1 tablet (25 mg total) by mouth daily.    Dispense:  90 tablet    Refill:  3  FLUoxetine (PROZAC) 20 MG capsule    Sig: Take 1 capsule (20 mg total) by mouth daily.    Dispense:  90 capsule    Refill:  3    Please note increased dose .   meloxicam (MOBIC) 15 MG tablet    Sig: Take 1 tablet (15 mg total) by mouth daily.    Dispense:  90 tablet    Refill:  3   terazosin (HYTRIN) 10 MG capsule    Sig: Take 1 capsule (10 mg total) by mouth at bedtime.    Dispense:  90 capsule    Refill:  3    Return in about 4 weeks (around 10/21/2021) for F/U, Echo @ [place] [place], [place] PCP.   Total time spent:*** Minutes Time spent includes review of chart, medications, test results, and follow up plan with the patient.   [place] Controlled Substance Database was reviewed by me.  This patient was seen by Jonetta Osgood, FNP-C in collaboration with Dr. Clayborn Bigness as a part of collaborative care agreement.  Toivo Bordon R. Valetta Fuller, MSN, FNP-C Internal  medicine

## 2021-09-24 LAB — CMP14+EGFR
ALT: 25 IU/L (ref 0–44)
AST: 31 IU/L (ref 0–40)
Albumin/Globulin Ratio: 1.6 (ref 1.2–2.2)
Albumin: 4.2 g/dL (ref 3.7–4.7)
Alkaline Phosphatase: 84 IU/L (ref 44–121)
BUN/Creatinine Ratio: 15 (ref 10–24)
BUN: 13 mg/dL (ref 8–27)
Bilirubin Total: 0.5 mg/dL (ref 0.0–1.2)
CO2: 21 mmol/L (ref 20–29)
Calcium: 9.4 mg/dL (ref 8.6–10.2)
Chloride: 102 mmol/L (ref 96–106)
Creatinine, Ser: 0.89 mg/dL (ref 0.76–1.27)
Globulin, Total: 2.7 g/dL (ref 1.5–4.5)
Glucose: 109 mg/dL — ABNORMAL HIGH (ref 70–99)
Potassium: 4.3 mmol/L (ref 3.5–5.2)
Sodium: 139 mmol/L (ref 134–144)
Total Protein: 6.9 g/dL (ref 6.0–8.5)
eGFR: 89 mL/min/{1.73_m2} (ref >59)

## 2021-09-24 LAB — CBC WITH DIFFERENTIAL/PLATELET
Basophils Absolute: 0 10*3/uL (ref 0.0–0.2)
Basos: 0 %
EOS (ABSOLUTE): 0.2 10*3/uL (ref 0.0–0.4)
Eos: 3 %
Hematocrit: 47.3 % (ref 37.5–51.0)
Hemoglobin: 15.3 g/dL (ref 13.0–17.7)
Immature Grans (Abs): 0 10*3/uL (ref 0.0–0.1)
Immature Granulocytes: 0 %
Lymphocytes Absolute: 2 10*3/uL (ref 0.7–3.1)
Lymphs: 27 %
MCH: 27.8 pg (ref 26.6–33.0)
MCHC: 32.3 g/dL (ref 31.5–35.7)
MCV: 86 fL (ref 79–97)
Monocytes Absolute: 0.7 10*3/uL (ref 0.1–0.9)
Monocytes: 10 %
Neutrophils Absolute: 4.3 10*3/uL (ref 1.4–7.0)
Neutrophils: 60 %
Platelets: 181 10*3/uL (ref 150–450)
RBC: 5.51 x10E6/uL (ref 4.14–5.80)
RDW: 13 % (ref 11.6–15.4)
WBC: 7.2 10*3/uL (ref 3.4–10.8)

## 2021-09-24 LAB — LIPID PANEL
Chol/HDL Ratio: 2.8 ratio (ref 0.0–5.0)
Cholesterol, Total: 107 mg/dL (ref 100–199)
HDL: 38 mg/dL — ABNORMAL LOW (ref >39)
LDL Chol Calc (NIH): 42 mg/dL (ref 0–99)
Triglycerides: 160 mg/dL — ABNORMAL HIGH (ref 0–149)
VLDL Cholesterol Cal: 27 mg/dL (ref 5–40)

## 2021-09-24 LAB — UA/M W/RFLX CULTURE, ROUTINE
Bilirubin, UA: NEGATIVE
Glucose, UA: NEGATIVE
Leukocytes,UA: NEGATIVE
Nitrite, UA: NEGATIVE
Protein,UA: NEGATIVE
RBC, UA: NEGATIVE
Specific Gravity, UA: 1.023 (ref 1.005–1.030)
Urobilinogen, Ur: 0.2 mg/dL (ref 0.2–1.0)
pH, UA: 5 (ref 5.0–7.5)

## 2021-09-24 LAB — PRO B NATRIURETIC PEPTIDE: NT-Pro BNP: 87 pg/mL (ref 0–486)

## 2021-09-24 LAB — MICROSCOPIC EXAMINATION
Bacteria, UA: NONE SEEN
Casts: NONE SEEN /lpf
RBC, Urine: >30 /hpf — AB (ref 0–2)
WBC, UA: NONE SEEN /hpf (ref 0–5)

## 2021-09-24 LAB — B12 AND FOLATE PANEL
Folate: 14.1 ng/mL (ref >3.0)
Vitamin B-12: >2000 pg/mL — ABNORMAL HIGH (ref 232–1245)

## 2021-09-24 LAB — TSH+FREE T4
Free T4: 0.94 ng/dL (ref 0.82–1.77)
TSH: 2.73 u[IU]/mL (ref 0.450–4.500)

## 2021-09-24 LAB — VITAMIN D 25 HYDROXY (VIT D DEFICIENCY, FRACTURES): Vit D, 25-Hydroxy: 39.3 ng/mL (ref 30.0–100.0)

## 2021-09-24 LAB — PSA TOTAL (REFLEX TO FREE): Prostate Specific Ag, Serum: 0.5 ng/mL (ref 0.0–4.0)

## 2021-09-25 DIAGNOSIS — R200 Anesthesia of skin: Secondary | ICD-10-CM | POA: Insufficient documentation

## 2021-09-27 ENCOUNTER — Telehealth: Payer: Self-pay | Admitting: Student-PharmD

## 2021-09-27 NOTE — Progress Notes (Addendum)
General Review Call  ? ?Jimmy Norton, Jimmy Norton [id number] ?19 years, Male  DOB: 09-13-1945  M: [phone] ? ?General Review (HC) ?Completed by Charlann Lange on 09/27/2021 ? ?Chart Review ?What recent interventions/DTPs have been made by any provider to improve the patient's conditions in the last 3 months?:  ?Office Visit: ?10/11/21 Jonetta Osgood, NP. For medicare wellness visit. STOPPED Baclofen, Tizanidine, Triamcinolone. and CLENPIQ 10-3.5-12 MG-GM -GM/160ML SOLN. ? ?Any recent hospitalizations or ED visits since last visit with CPP?: No ? ?Adherence Review ?Adherence rates for STAR metric medications: Losartan 25 mg 08/27/21 30 DS ?Rosuvastatin 5 mg 07/13/21 90 DS ? ?Adherence rates for medications indicated for disease state being reviewed: Losartan 25 mg 08/27/21 30 DS ?Rosuvastatin 5 mg 07/13/21 90 DS ? ?Does the patient have >5 day gap between last estimated fill dates for any of the above medications?: No ? ?Disease State Questions ?Able to connect with the Patient?: Yes ? ?Did patient have any problems with their health recently?: No ? ?Did patient have any problems with their pharmacy?: No ? ?Does patient have any issues or side effects with their medications?: No ? ?Additional  information to pass to Patient's CPP?: No ? ?Anything we can do to help take better care of Patient?: No ? ?Engagement Notes ?HC Chart Review: 10 min 09/27/21 ?CP Review: 10 min 09/27/21 ? ? ? ?Reviewed by: ?Alena Bills, PharmD ?Clinical Pharmacist ?[phone] ? ? ?

## 2021-09-30 NOTE — Telephone Encounter (Signed)
error 

## 2021-10-01 ENCOUNTER — Other Ambulatory Visit: Payer: Self-pay | Admitting: Family Medicine

## 2021-10-01 ENCOUNTER — Telehealth: Payer: Self-pay

## 2021-10-01 DIAGNOSIS — M5412 Radiculopathy, cervical region: Secondary | ICD-10-CM

## 2021-10-01 NOTE — Telephone Encounter (Signed)
Oxygen therapy order signed by provider and placed in AHP folder. 

## 2021-10-06 ENCOUNTER — Other Ambulatory Visit: Payer: Self-pay | Admitting: Family Medicine

## 2021-10-06 DIAGNOSIS — M4802 Spinal stenosis, cervical region: Secondary | ICD-10-CM

## 2021-10-06 DIAGNOSIS — M5030 Other cervical disc degeneration, unspecified cervical region: Secondary | ICD-10-CM | POA: Diagnosis not present

## 2021-10-06 DIAGNOSIS — G4733 Obstructive sleep apnea (adult) (pediatric): Secondary | ICD-10-CM | POA: Diagnosis not present

## 2021-10-06 DIAGNOSIS — M5412 Radiculopathy, cervical region: Secondary | ICD-10-CM | POA: Diagnosis not present

## 2021-10-13 ENCOUNTER — Ambulatory Visit: Admission: RE | Admit: 2021-10-13 | Payer: Medicare HMO | Source: Ambulatory Visit

## 2021-10-20 ENCOUNTER — Other Ambulatory Visit: Payer: Medicare HMO

## 2021-10-20 ENCOUNTER — Encounter: Payer: Self-pay | Admitting: Nurse Practitioner

## 2021-10-25 ENCOUNTER — Other Ambulatory Visit: Payer: Self-pay

## 2021-10-25 DIAGNOSIS — I10 Essential (primary) hypertension: Secondary | ICD-10-CM

## 2021-10-25 MED ORDER — LOSARTAN POTASSIUM 25 MG PO TABS: 25.0000 mg | ORAL_TABLET | Freq: Every day | ORAL | 1 refills | Status: DC

## 2021-10-27 ENCOUNTER — Other Ambulatory Visit: Payer: Self-pay

## 2021-10-27 ENCOUNTER — Other Ambulatory Visit: Payer: Medicare HMO

## 2021-10-27 DIAGNOSIS — I700 Atherosclerosis of aorta: Secondary | ICD-10-CM

## 2021-10-27 MED ORDER — ROSUVASTATIN CALCIUM 5 MG PO TABS: 5.0000 mg | ORAL_TABLET | Freq: Every day | ORAL | 3 refills | Status: DC

## 2021-10-28 ENCOUNTER — Ambulatory Visit: Payer: Medicare HMO | Admitting: Nurse Practitioner

## 2021-11-03 ENCOUNTER — Ambulatory Visit: Payer: Medicare HMO

## 2021-11-03 DIAGNOSIS — R0602 Shortness of breath: Secondary | ICD-10-CM | POA: Diagnosis not present

## 2021-11-06 DIAGNOSIS — G4733 Obstructive sleep apnea (adult) (pediatric): Secondary | ICD-10-CM | POA: Diagnosis not present

## 2021-11-06 DIAGNOSIS — J9601 Acute respiratory failure with hypoxia: Secondary | ICD-10-CM | POA: Diagnosis not present

## 2021-11-08 NOTE — Discharge Instructions (Signed)
Instructions after Total Knee Replacement   Jimmy Norton P. [name], [name], M.D.     Dept. of Orthopaedics & Sports Medicine  [facility]  [address]  [place], [place]  [address]  Phone: [phone]   Fax: [phone]    DIET: Drink plenty of non-alcoholic fluids. Resume your normal diet. Include foods high in fiber.  ACTIVITY:  You may use crutches or a walker with weight-bearing as tolerated, unless instructed otherwise. You may be weaned off of the walker or crutches by your Physical Therapist.  Do NOT place pillows under the knee. Anything placed under the knee could limit your ability to straighten the knee.   Continue doing gentle exercises. Exercising will reduce the pain and swelling, increase motion, and prevent muscle weakness.   Please continue to use the TED compression stockings for 6 weeks. You may remove the stockings at night, but should reapply them in the morning. Do not drive or operate any equipment until instructed.  WOUND CARE:  Continue to use the PolarCare or ice packs periodically to reduce pain and swelling. You may bathe or shower after the staples are removed at the first office visit following surgery.  MEDICATIONS: You may resume your regular medications. Please take the pain medication as prescribed on the medication. Do not take pain medication on an empty stomach. You have been given a prescription for a blood thinner (Lovenox or Coumadin). Please take the medication as instructed. (NOTE: After completing a 2 week course of Lovenox, take one Enteric-coated aspirin once a day. This along with elevation will help reduce the possibility of phlebitis in your operated leg.) Do not drive or drink alcoholic beverages when taking pain medications.  CALL THE OFFICE FOR: Temperature above 101 degrees Excessive bleeding or drainage on the dressing. Excessive swelling, coldness, or paleness of the toes. Persistent nausea and vomiting.  FOLLOW-UP:  You  should have an appointment to return to the office in 10-14 days after surgery. Arrangements have been made for continuation of Physical Therapy (either home therapy or outpatient therapy).   [facility] Department Directory         [email or web address]       [email or web address]          Cardiology  Appointments: [place] - [phone] [place] - [phone]  Endocrinology  Appointments: [place] - [phone] [place] - [phone]  Gastroenterology  Appointments: [place] - [phone] [place] - [phone]        General Surgery   Appointments: [place] - [phone]  Internal Medicine/Family Medicine  Appointments: [place] - [phone] [place] - [phone] [place] - [phone]  Metabolic and Weigh Loss Surgery  Appointments: [place] - [phone]        Neurology  Appointments: [place] - [phone] [place] - [phone]  Neurosurgery  Appointments: [place] - [phone]  Obstetrics & Gynecology  Appointments: [place] - [phone] [place] - [phone]        Pediatrics  Appointments: [place] - [phone] [place] - [phone]  Physiatry  Appointments: [place] -[phone]  Physical Therapy  Appointments: [place] - [phone] [place] - [phone]        Podiatry  Appointments: [place] - [phone] [place] - [phone]  Pulmonology  Appointments: [place] - [phone]  Rheumatology  Appointments: [place] - [phone]        [place] Location: [facility]  [address] [place], [place]  [address]  [place] Location: [facility] [address] [place], [place]  [address]  [place] Location: [facility] [address] [place], [place]  [address]    

## 2021-11-09 ENCOUNTER — Other Ambulatory Visit: Payer: Self-pay

## 2021-11-09 ENCOUNTER — Encounter
Admission: RE | Admit: 2021-11-09 | Discharge: 2021-11-09 | Disposition: A | Discharge status: Home / Self Care | Payer: Medicare HMO | Source: Ambulatory Visit | Attending: Orthopedic Surgery | Admitting: Orthopedic Surgery

## 2021-11-09 VITALS — HR 75 | Temp 98.0°F | Resp 18 | Ht 69.0 in | Wt 301.0 lb

## 2021-11-09 DIAGNOSIS — R300 Dysuria: Secondary | ICD-10-CM

## 2021-11-09 DIAGNOSIS — Z01810 Encounter for preprocedural cardiovascular examination: Secondary | ICD-10-CM | POA: Diagnosis not present

## 2021-11-09 DIAGNOSIS — Z01818 Encounter for other preprocedural examination: Secondary | ICD-10-CM

## 2021-11-09 DIAGNOSIS — Z01812 Encounter for preprocedural laboratory examination: Secondary | ICD-10-CM

## 2021-11-09 DIAGNOSIS — Z880 Allergy status to penicillin: Secondary | ICD-10-CM | POA: Diagnosis not present

## 2021-11-09 DIAGNOSIS — E782 Mixed hyperlipidemia: Secondary | ICD-10-CM | POA: Diagnosis not present

## 2021-11-09 DIAGNOSIS — M1712 Unilateral primary osteoarthritis, left knee: Secondary | ICD-10-CM

## 2021-11-09 HISTORY — DX: Headache, unspecified: R51.9

## 2021-11-09 HISTORY — DX: Unspecified osteoarthritis, unspecified site: M19.90

## 2021-11-09 HISTORY — DX: Pneumonia, unspecified organism: J18.9

## 2021-11-09 HISTORY — DX: Other complications of anesthesia, initial encounter: T88.59XA

## 2021-11-09 LAB — CBC
HCT: 45 % (ref 39.0–52.0)
Hemoglobin: 14.4 g/dL (ref 13.0–17.0)
MCH: 27.9 pg (ref 26.0–34.0)
MCHC: 32 g/dL (ref 30.0–36.0)
MCV: 87 fL (ref 80.0–100.0)
Platelets: 218 10*3/uL (ref 150–400)
RBC: 5.17 MIL/uL (ref 4.22–5.81)
RDW: 13.5 % (ref 11.5–15.5)
WBC: 7 10*3/uL (ref 4.0–10.5)
nRBC: 0 % (ref 0.0–0.2)

## 2021-11-09 LAB — TYPE AND SCREEN
ABO/RH(D): O POS
Antibody Screen: NEGATIVE

## 2021-11-09 LAB — URINALYSIS, ROUTINE W REFLEX MICROSCOPIC
Bilirubin Urine: NEGATIVE
Glucose, UA: NEGATIVE mg/dL
Hgb urine dipstick: NEGATIVE
Ketones, ur: NEGATIVE mg/dL
Leukocytes,Ua: NEGATIVE
Nitrite: NEGATIVE
Protein, ur: NEGATIVE mg/dL
Specific Gravity, Urine: 1.011 (ref 1.005–1.030)
pH: 5 (ref 5.0–8.0)

## 2021-11-09 LAB — COMPREHENSIVE METABOLIC PANEL
ALT: 22 U/L (ref 0–44)
AST: 26 U/L (ref 15–41)
Albumin: 3.6 g/dL (ref 3.5–5.0)
Alkaline Phosphatase: 62 U/L (ref 38–126)
Anion gap: 9 (ref 5–15)
BUN: 12 mg/dL (ref 8–23)
CO2: 25 mmol/L (ref 22–32)
Calcium: 8.9 mg/dL (ref 8.9–10.3)
Chloride: 106 mmol/L (ref 98–111)
Creatinine, Ser: 0.78 mg/dL (ref 0.61–1.24)
GFR, Estimated: >60 mL/min (ref >60)
Glucose, Bld: 92 mg/dL (ref 70–99)
Potassium: 3.7 mmol/L (ref 3.5–5.1)
Sodium: 140 mmol/L (ref 135–145)
Total Bilirubin: 0.7 mg/dL (ref 0.3–1.2)
Total Protein: 7 g/dL (ref 6.5–8.1)

## 2021-11-09 LAB — SURGICAL PCR SCREEN
MRSA, PCR: NEGATIVE
Staphylococcus aureus: NEGATIVE

## 2021-11-09 LAB — SEDIMENTATION RATE: Sed Rate: 3 mm/hr (ref 0–20)

## 2021-11-09 LAB — C-REACTIVE PROTEIN: CRP: 0.8 mg/dL (ref <1.0)

## 2021-11-09 NOTE — Patient Instructions (Addendum)
Your procedure is scheduled on: Monday 11/22/21 ?Report to the Registration Desk on the 1st floor of the [facility]. ?To find out your arrival time, please call [phone] between 1PM - 3PM on: Friday 11/20/21 ? ?REMEMBER: ?Instructions that are not followed completely may result in serious medical risk, up to and including death; or upon the discretion of your surgeon and anesthesiologist your surgery may need to be rescheduled. ? ?Do not eat food after midnight the night before surgery.  ?No gum chewing, lozengers or hard candies. ? ?You may however, drink CLEAR liquids up to 2 hours before you are scheduled to arrive for your surgery. Do not drink anything within 2 hours of your scheduled arrival time. ? ?Clear liquids include: ?- water  ?- apple juice without pulp ?- gatorade (not RED colors) ?- black coffee or tea (Do NOT add milk or creamers to the coffee or tea) ?Do NOT drink anything that is not on this list. ? ?In addition, your doctor has ordered for you to drink the provided  ?Ensure Pre-Surgery Clear Carbohydrate Drink  ?Drinking this carbohydrate drink up to two hours before surgery helps to reduce insulin resistance and improve patient outcomes. Please complete drinking 2 hours prior to scheduled arrival time. ? ?TAKE THESE MEDICATIONS THE MORNING OF SURGERY WITH A SIP OF WATER: ?FLUoxetine (PROZAC) 20 MG capsule ?rosuvastatin (CRESTOR) 5 MG tablet ? ?One week prior to surgery: ?Stop Anti-inflammatories (NSAIDS) such as meloxicam (MOBIC) 15 MG tablet, Advil, Aleve, Ibuprofen, Motrin, Naproxen, Naprosyn and Aspirin based products such as Excedrin, Goodys Powder, BC Powder. ? ?Stop taking your Cyanocobalamin (B-12) 2500 MCG TABS, Pyridoxine HCl (B-6) 100 MG TABS, Probiotic Product (PROBIOTIC-10 PO), Multiple Vitamins-Minerals (MULTIVITAMIN WITH MINERALS) tablet, cholecalciferol (VITAMIN D) 400 UNITS TABS tablet, GLUCOSAMINE-CHONDROITIN PO, and ANY OVER THE COUNTER supplements until after  surgery. ? ?You may however, continue to take Tylenol if needed for pain up until the day of surgery. ? ?No Alcohol for 24 hours before or after surgery. ? ?No Smoking including e-cigarettes for 24 hours prior to surgery.  ?No chewable tobacco products for at least 6 hours prior to surgery.  ?No nicotine patches on the day of surgery. ? ?Do not use any "recreational" drugs for at least a week prior to your surgery.  ?Please be advised that the combination of cocaine and anesthesia may have negative outcomes, up to and including death. ?If you test positive for cocaine, your surgery will be cancelled. ? ?On the morning of surgery brush your teeth with toothpaste and water, you may rinse your mouth with mouthwash if you wish. ?Do not swallow any toothpaste or mouthwash. ? ?Use CHG Soap as directed on instruction sheet. ? ?Do not wear jewelry. ? ?Do not wear lotions, powders, or colognes.  ? ?Do not shave body from the neck down 48 hours prior to surgery just in case you cut yourself which could leave a site for infection.  ?Also, freshly shaved skin may become irritated if using the CHG soap. ? ?hearing aids may not be worn into surgery. ? ?Do not bring valuables to the hospital. [facility] is not responsible for any missing/lost belongings or valuables.  ? ?Bring your C-PAP to the hospital with you in case you may have to spend the night.  ? ?Notify your doctor if there is any change in your medical condition (cold, fever, infection). ? ?Wear comfortable clothing (specific to your surgery type) to the hospital. ? ?After surgery, you can help prevent lung  complications by doing breathing exercises.  ?Take deep breaths and cough every 1-2 hours. Your doctor may order a device called an Incentive Spirometer to help you take deep breaths. ? ?If you are being admitted to the hospital overnight, leave your suitcase in the car. ?After surgery it may be brought to your room. ? ?If you are taking public transportation, you  will need to have a responsible adult (18 years or older) with you. ?Please confirm with your physician that it is acceptable to use public transportation.  ? ?Please call the [place] Dept. at [phone] if you have any questions about these instructions. ? ?Surgery Visitation Policy: ? ?Patients undergoing a surgery or procedure may have two family members or support persons with them as long as the person is not COVID-19 positive or experiencing its symptoms.  ? ?Inpatient Visitation:   ? ?Visiting hours are 7 a.m. to 8 p.m. ?Up to four visitors are allowed at one time in a patient room, including children. The visitors may rotate out with other people during the day. One designated support person (adult) may remain overnight.  ?

## 2021-11-11 DIAGNOSIS — M1712 Unilateral primary osteoarthritis, left knee: Secondary | ICD-10-CM | POA: Diagnosis not present

## 2021-11-12 LAB — IGE: IgE (Immunoglobulin E), Serum: 82 IU/mL (ref 6–495)

## 2021-11-17 ENCOUNTER — Other Ambulatory Visit: Payer: Medicare HMO

## 2021-11-21 MED ORDER — TRANEXAMIC ACID-NACL 1000-0.7 MG/100ML-% IV SOLN
1000.0000 mg | INTRAVENOUS | Status: AC
  Administered 2021-11-22: 1000 mg via INTRAVENOUS

## 2021-11-21 MED ORDER — CHLORHEXIDINE GLUCONATE 0.12 % MT SOLN
15.0000 mL | Freq: Once | OROMUCOSAL | Status: AC
Start: 2021-11-21 — End: 2021-11-22

## 2021-11-21 MED ORDER — DEXAMETHASONE SODIUM PHOSPHATE 10 MG/ML IJ SOLN: 8.0000 mg | Freq: Once | INTRAMUSCULAR | Status: AC

## 2021-11-21 MED ORDER — CHLORHEXIDINE GLUCONATE 4 % EX LIQD: 60.0000 mL | Freq: Once | CUTANEOUS | Status: DC

## 2021-11-21 MED ORDER — CELECOXIB 200 MG PO CAPS
400.0000 mg | ORAL_CAPSULE | Freq: Once | ORAL | Status: AC
  Administered 2021-11-22: 400 mg via ORAL

## 2021-11-21 MED ORDER — LACTATED RINGERS IV SOLN
INTRAVENOUS | Status: DC
Start: 2021-11-21 — End: 2021-11-22

## 2021-11-21 MED ORDER — CEFAZOLIN SODIUM-DEXTROSE 2-4 GM/100ML-% IV SOLN
2.0000 g | INTRAVENOUS | Status: AC
  Administered 2021-11-22: 2 g via INTRAVENOUS

## 2021-11-21 MED ORDER — GABAPENTIN 300 MG PO CAPS: 300.0000 mg | ORAL_CAPSULE | Freq: Once | ORAL | Status: AC

## 2021-11-21 MED ORDER — ORAL CARE MOUTH RINSE: 15.0000 mL | Freq: Once | OROMUCOSAL | Status: AC

## 2021-11-21 NOTE — H&P (Signed)
ORTHOPAEDIC HISTORY & PHYSICAL ?Jimmy Norton [place], [place] - 11/11/2021 9:45 AM EDT ?Formatting of this note is different from the original. ?[facility] ?Chief Complaint:  ? ?Chief Complaint  ?Patient presents with  ? Knee Pain  ?H & P LEFT KNEE  ? ?History of Present Illness:  ? ?Jimmy Norton is a 76 y.o. male that presents to clinic today for his preoperative history and evaluation. Patient presents unaccompanied. The patient is scheduled to undergo a left total knee arthroplasty on 11/22/21 by Dr. Marry Guan. His pain began around 2 years ago. The pain is located primarily along the medial aspect of the knee. He describes his pain as worse with weightbearing. He reports associated swelling with some giving way of the knee. He denies associated numbness or tingling, denies locking.  ? ?The patient's symptoms have progressed to the point that they decrease his quality of life. The patient has previously undergone conservative treatment including NSAIDS and injections to the knee without adequate control of his symptoms. ? ?Denies history of DVT, lumbar surgery, significant cardiac history.  ? ?Patient will have his wife at home to help post-operatively. He states she works during the day but is available evenings.  ? ?Patient reports allergy to penicillin. Had a rash but has not taken it since.  ? ?Past Medical, Surgical, Family, Social History, Allergies, Medications:  ? ?Past Medical History:  ?Past Medical History:  ?Diagnosis Date  ? Allergic rhinitis due to pollen 07/28/2017  ? Ankle fracture, left 1970  ? Anxiety  ? Calculus of kidney 07/28/2017  ? Carpal tunnel syndrome, bilateral  ? Essential (primary) hypertension 07/28/2017  ? Gallstones 03/05/2013  ? Hyperlipidemia  ? Hypertension  ? Hypoxemia 07/28/2017  ? Major depressive disorder, recurrent, moderate (CMS-HCC) 07/28/2017  ? Mixed hyperlipidemia 07/28/2017  ? OAB (overactive bladder) 09/23/2015  ? Plantar  fasciitis 11/27/2017  ? Primary osteoarthritis of right elbow 11/27/2017  ? Sleep apnea  ? 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 ?Past Surgical History:  ?Past Surgical History:  ?Procedure Laterality Date  ? Right total knee arthroplasty using computer-assisted navigation 07/08/2010  ?Dr Albesa Seen  ? Bilateral Inguinal Hernia Repair  ? ENDOSCOPIC CARPAL TUNNEL RELEASE Bilateral  ? Nose Surgery related to Sleep Apnea  ? ?Current Medications:  ?Current Outpatient Medications  ?Medication Sig Dispense Refill  ? baclofen (LIORESAL) 10 MG tablet Take 5 mg by mouth once daily as needed  ? cholecalciferol (VITAMIN D3) 400 unit tablet Take 800 Units by mouth once daily  ? cyanocobalamin, vitamin B-12, 2,500 mcg Tab Take 2 tablets by mouth once daily  ? FLUoxetine (PROZAC) 10 MG capsule Take 1 capsule by mouth once daily  ? gabapentin (NEURONTIN) 100 MG capsule Take 100 mg by mouth every evening  ? glucosamine su 2KCl-chondroit 500-400 mg Tab Take 2 tablets by mouth once daily  ? losartan (COZAAR) 25 MG tablet Take 1 tablet by mouth once daily  ? meloxicam (MOBIC) 15 MG tablet Take 15 mg by mouth once daily as needed  ? multivitamin with minerals tablet Take 1 tablet by mouth once daily  ? naproxen sodium (ALEVE) 220 MG tablet Take 440 mg by mouth 2 (two) times daily as needed for Pain  ? pyridoxine, vitamin B6, (B-6) 100 MG tablet Take 200 mg by mouth once daily  ? rosuvastatin (CRESTOR) 5 MG tablet Take 5 mg by mouth once daily  ? terazosin (HYTRIN) 10 MG capsule Take 10 mg by mouth nightly  ?  tiZANidine (ZANAFLEX) 4 MG tablet Take 4 mg by mouth at bedtime as needed  ? diazePAM (VALIUM) 5 MG tablet 1-2 30 minutes before MRI 2 tablet 0  ? ?No current facility-administered medications for this visit.  ? ?Allergies:  ?Allergies  ?Allergen Reactions  ? Bee Venom Protein (Honey Bee) Swelling and Rash  ? Penicillins Rash  ? ?Social History:  ?Social History  ? ?Socioeconomic History  ? Marital status: Married  ?Spouse name:  Margaretha Sheffield  ? Number of children: 2  ? Years of education: 53  ? Highest education level: High school graduate  ?Occupational History  ? Occupation: Retired- Administrator  ?Tobacco Use  ? Smoking status: Former  ?Types: Cigarettes, Cigars  ? Smokeless tobacco: Current  ?Types: Chew  ? Tobacco comments:  ?H/O cigarettes, cigars and pipe smoking. Chews 1/2 can of tobacco a day x 55 years  ?Vaping Use  ? Vaping Use: Never used  ?Substance and Sexual Activity  ? Alcohol use: No  ? Drug use: No  ? Sexual activity: Defer  ?Partners: Female  ? ?Family History:  ?Family History  ?Problem Relation Age of Onset  ? COPD Mother  ? Diabetes type II Mother  ? Heart disease Mother  ? Brain cancer Father  ? COPD Sister  ? COPD Brother  ? Brain cancer Paternal Aunt  ? Brain cancer Paternal Uncle  ? ?Review of Systems:  ? ?A 10+ ROS was performed, reviewed, and the pertinent orthopaedic findings are documented in the HPI.  ? ?Physical Examination:  ? ?BP (!) 140/78 (BP Location: Left upper arm, Patient Position: Sitting, BP Cuff Size: Large Adult)  Ht 175.3 cm ('5\' 9"'$ )  Wt (!) 134.8 kg (297 lb 3.2 oz)  BMI 43.89 kg/m?  ? ?Patient is a well-developed, well-nourished male in no acute distress. Patient has normal mood and affect. Patient is alert and oriented to person, place, and time.  ? ?HEENT: Atraumatic, normocephalic. Pupils equal and reactive to light. Extraocular motion intact. Noninjected sclera. ? ?Cardiovascular: Regular rate and rhythm, with no murmurs, rubs, or gallops. Distal pulses palpable. No carotid bruits. ? ?Respiratory: Lungs clear to auscultation bilaterally.  ? ?Left Knee: ?Soft tissue swelling: mild ?Effusion: none ?Erythema: none ?Crepitance: mild ?Tenderness: medial ?Alignment: relative varus ?Mediolateral laxity: medial pseudolaxity ?Posterior sag: negative ?Patellar tracking: Good tracking without evidence of subluxation or tilt ?Atrophy: No significant atrophy.  ?Quadriceps tone was good. ?Range of motion:  0/5/117 degrees ? ?Able to dorsiflex and plantarflex the left ankle. Able to flex and extend the toes. ? ?Sensation intact over the saphenous, lateral sural cutaneous, superficial fibular, and deep fibular nerve distributions. ? ?Tests Performed/Reviewed:  ?X-rays ? ?X-ray knee left 3 views ? ?Result Date: 11/11/2021 ?3 views of the left knee were obtained. Images reveal severe loss of medial compartment joint space with osteophyte formation. No fractures or dislocations. No other osseous abnormality noted  ? ?Impression:  ? ?ICD-10-CM  ?1. Primary osteoarthritis of left knee M17.12  ? ?Plan:  ? ?The patient has end-stage degenerative changes of the left knee. It was explained to the patient that the condition is progressive in nature. Having failed conservative treatment, the patient has elected to proceed with a total joint arthroplasty. The patient will undergo a total joint arthroplasty with Dr. Marry Guan. The risks of surgery, including blood clot and infection, were discussed with the patient. Measures to reduce these risks, including the use of anticoagulation, perioperative antibiotics, and early ambulation were discussed. The importance of postoperative physical  therapy was discussed with the patient. The patient elects to proceed with surgery. The patient is instructed to stop all blood thinners prior to surgery. The patient is instructed to call the hospital the day before surgery to learn of the proper arrival time.  ? ?Contact our office with any questions or concerns. Follow up as indicated, or sooner should any new problems arise, if conditions worsen, or if they are otherwise concerned.  ? ?Gwenlyn Fudge, PA -C ?[facility] ?[address] ?[place], [place] [address] ?Phone: [phone] ? ?This note was generated in part with voice recognition software and I apologize for any typographical errors that were not detected and corrected. ? ?Electronically signed  by Gwenlyn Fudge, PA at 11/11/2021 10:47 AM EDT ? ?

## 2021-11-21 NOTE — Anesthesia Preprocedure Evaluation (Addendum)
Anesthesia Evaluation  ?Patient identified by MRN, date of birth, ID band ?Patient awake ? ? ? ?Reviewed: ?Allergy & Precautions, NPO status , Patient's Chart, lab work & pertinent test results ? ?History of Anesthesia Complications ?Negative for: history of anesthetic complications ? ?Airway ?Mallampati: III ? ? ?Neck ROM: Full ? ? ? Dental ? ?(+) Edentulous Upper, Edentulous Lower ?  ?Pulmonary ?shortness of breath, sleep apnea and Continuous Positive Airway Pressure Ventilation , Current Smoker,  ?Chewing tobacco use ?  ?Pulmonary exam normal ?breath sounds clear to auscultation ? ? ? ? ? ? Cardiovascular ?Exercise Tolerance: Poor ?hypertension, + Orthopnea and + DOE  ? ?Rhythm:Regular Rate:Normal ?+ Peripheral Edema ?ECHO reviewed 4/23 ? ?Pt reports having a cardiac eval at the [facility] recently with reported "no concerns."  ? ?Pt reports atypical chest pain that rarely occurs with no correlation.  ?  ?Neuro/Psych ?PSYCHIATRIC DISORDERS Anxiety Depression Cervical stenosis of spinal canal ? ?MRI Lumbar Spine 2022: ?IMPRESSION: ?1. Trace retrolisthesis of L2 on L3 and grade 1 anterolisthesis of ?L4 on L5 and L5 on S1, progressed since 2020. ?2. Superiorly migrated left subarticular zone disc extrusion at ?L5-S1 superimposed on degenerative changes resulting in moderate to ?severe bilateral neural foraminal stenosis. The disc extrusion ?likely contacts and may impinge the left L5 nerve root in the ?subarticular zone, which may account for the patient's left ?radicular symptoms. ?3. Mild-to-moderate spinal canal stenosis and mild-to-moderate right ?neural foraminal stenosis at L3-L4. Foraminal/extraforaminal disc ?material may contact the exiting right L3 nerve root. ?4. Moderate to severe left neural foraminal stenosis at L4-L5. ?Additional multilevel degenerative changes detailed above. ?5. Multilevel facet arthropathy described above, most advanced at ?L3-L4 with associated effusions  at multiple levels. Mild bilateral ?perifacet edema at L5-S1, worse on the right. ? Neuromuscular disease (reports intermittent toe pains and right finger pains)   ? GI/Hepatic ?negative GI ROS,   ?Endo/Other  ?Morbid obesityClass 3 obesity ? Renal/GU ?negative Renal ROS  ? ?BPH ? ?  ?Musculoskeletal ? ?(+) Arthritis ,  ? Abdominal ?(+) + obese,   ?Peds ? Hematology ?negative hematology ROS ?(+)   ?Anesthesia Other Findings ? ? Reproductive/Obstetrics ? ?  ? ? ? ? ? ? ? ? ? ? ? ? ? ?  ?  ? ? ? ? ? ? ? ?Anesthesia Physical ? ?Anesthesia Plan ? ?ASA: 3 ? ?Anesthesia Plan: Spinal  ? ?Post-op Pain Management: Gabapentin PO (pre-op)*, Celebrex PO (pre-op)* and Regional block*  ? ?Induction: Intravenous ? ?PONV Risk Score and Plan: 1 and Propofol infusion, TIVA and Treatment may vary due to age or medical condition ? ?Airway Management Planned: Natural Airway and Nasal CPAP ? ?Additional Equipment:  ? ?Intra-op Plan:  ? ?Post-operative Plan:  ? ?Informed Consent: I have reviewed the patients History and Physical, chart, labs and discussed the procedure including the risks, benefits and alternatives for the proposed anesthesia with the patient or authorized representative who has indicated his/her understanding and acceptance.  ? ? ? ? ? ?Plan Discussed with: CRNA ? ?Anesthesia Plan Comments:   ? ? ? ? ? ?Anesthesia Quick Evaluation ? ?

## 2021-11-22 ENCOUNTER — Other Ambulatory Visit: Payer: Self-pay

## 2021-11-22 ENCOUNTER — Other Ambulatory Visit: Payer: Medicare HMO

## 2021-11-22 ENCOUNTER — Ambulatory Visit: Payer: Medicare HMO | Admitting: Anesthesiology

## 2021-11-22 ENCOUNTER — Encounter: Payer: Self-pay | Admitting: Orthopedic Surgery

## 2021-11-22 ENCOUNTER — Encounter
Admission: RE | Disposition: A | Discharge status: Home Health | Payer: Self-pay | Source: Home / Self Care | Attending: Orthopedic Surgery

## 2021-11-22 ENCOUNTER — Observation Stay
Admission: RE | Admit: 2021-11-22 | Discharge: 2021-11-23 | Disposition: A | Discharge status: Home Health | Payer: Medicare HMO | Attending: Orthopedic Surgery | Admitting: Orthopedic Surgery

## 2021-11-22 ENCOUNTER — Observation Stay: Payer: Medicare HMO

## 2021-11-22 DIAGNOSIS — Z96651 Presence of right artificial knee joint: Secondary | ICD-10-CM | POA: Insufficient documentation

## 2021-11-22 DIAGNOSIS — R300 Dysuria: Secondary | ICD-10-CM

## 2021-11-22 DIAGNOSIS — Z79899 Other long term (current) drug therapy: Secondary | ICD-10-CM | POA: Diagnosis not present

## 2021-11-22 DIAGNOSIS — M1712 Unilateral primary osteoarthritis, left knee: Principal | ICD-10-CM

## 2021-11-22 DIAGNOSIS — Z87891 Personal history of nicotine dependence: Secondary | ICD-10-CM | POA: Insufficient documentation

## 2021-11-22 DIAGNOSIS — E782 Mixed hyperlipidemia: Secondary | ICD-10-CM

## 2021-11-22 DIAGNOSIS — Z96659 Presence of unspecified artificial knee joint: Secondary | ICD-10-CM

## 2021-11-22 DIAGNOSIS — I10 Essential (primary) hypertension: Secondary | ICD-10-CM | POA: Insufficient documentation

## 2021-11-22 DIAGNOSIS — H52203 Unspecified astigmatism, bilateral: Secondary | ICD-10-CM | POA: Insufficient documentation

## 2021-11-22 DIAGNOSIS — E669 Obesity, unspecified: Secondary | ICD-10-CM | POA: Diagnosis not present

## 2021-11-22 DIAGNOSIS — Z880 Allergy status to penicillin: Secondary | ICD-10-CM

## 2021-11-22 DIAGNOSIS — N481 Balanitis: Secondary | ICD-10-CM | POA: Insufficient documentation

## 2021-11-22 DIAGNOSIS — Z6841 Body Mass Index (BMI) 40.0 and over, adult: Secondary | ICD-10-CM | POA: Diagnosis not present

## 2021-11-22 DIAGNOSIS — Z96652 Presence of left artificial knee joint: Secondary | ICD-10-CM | POA: Diagnosis not present

## 2021-11-22 HISTORY — PX: REPLACEMENT TOTAL KNEE: SUR1224

## 2021-11-22 HISTORY — PX: KNEE ARTHROPLASTY: SHX992

## 2021-11-22 LAB — ABO/RH: ABO/RH(D): O POS

## 2021-11-22 SURGERY — ARTHROPLASTY, KNEE, TOTAL, USING IMAGELESS COMPUTER-ASSISTED NAVIGATION
Anesthesia: General | Site: Knee | Laterality: Left

## 2021-11-22 MED ORDER — OXYCODONE HCL 5 MG PO TABS
ORAL_TABLET | ORAL | Status: AC
  Filled 2021-11-22: qty 1

## 2021-11-22 MED ORDER — SODIUM CHLORIDE 0.9 % IV SOLN
INTRAVENOUS | Status: DC | PRN
  Administered 2021-11-22: 60 mL

## 2021-11-22 MED ORDER — ACETAMINOPHEN 10 MG/ML IV SOLN
INTRAVENOUS | Status: AC
  Filled 2021-11-22: qty 100

## 2021-11-22 MED ORDER — TRANEXAMIC ACID-NACL 1000-0.7 MG/100ML-% IV SOLN
INTRAVENOUS | Status: AC
  Filled 2021-11-22: qty 100

## 2021-11-22 MED ORDER — CEFAZOLIN SODIUM-DEXTROSE 2-4 GM/100ML-% IV SOLN
INTRAVENOUS | Status: AC
  Filled 2021-11-22: qty 100

## 2021-11-22 MED ORDER — DEXAMETHASONE SODIUM PHOSPHATE 10 MG/ML IJ SOLN
INTRAMUSCULAR | Status: AC
  Administered 2021-11-22: 8 mg via INTRAVENOUS
  Filled 2021-11-22: qty 1

## 2021-11-22 MED ORDER — FENTANYL CITRATE (PF) 100 MCG/2ML IJ SOLN
INTRAMUSCULAR | Status: AC
  Filled 2021-11-22: qty 2

## 2021-11-22 MED ORDER — ONDANSETRON HCL 4 MG/2ML IJ SOLN
4.0000 mg | Freq: Four times a day (QID) | INTRAMUSCULAR | Status: DC | PRN
  Administered 2021-11-22: 4 mg via INTRAVENOUS
  Filled 2021-11-22: qty 2

## 2021-11-22 MED ORDER — PROMETHAZINE HCL 25 MG/ML IJ SOLN
INTRAMUSCULAR | Status: AC
  Filled 2021-11-22: qty 1

## 2021-11-22 MED ORDER — CHLORHEXIDINE GLUCONATE 0.12 % MT SOLN
OROMUCOSAL | Status: AC
  Administered 2021-11-22: 15 mL via OROMUCOSAL
  Filled 2021-11-22: qty 15

## 2021-11-22 MED ORDER — ACETAMINOPHEN 325 MG PO TABS: 325.0000 mg | ORAL_TABLET | Freq: Four times a day (QID) | ORAL | Status: DC | PRN

## 2021-11-22 MED ORDER — CEFAZOLIN SODIUM-DEXTROSE 2-4 GM/100ML-% IV SOLN
2.0000 g | Freq: Four times a day (QID) | INTRAVENOUS | Status: AC
  Administered 2021-11-22 (×2): 2 g via INTRAVENOUS
  Filled 2021-11-22 (×2): qty 100

## 2021-11-22 MED ORDER — OXYCODONE HCL 5 MG PO TABS: 5.0000 mg | ORAL_TABLET | ORAL | Status: DC | PRN

## 2021-11-22 MED ORDER — TRANEXAMIC ACID-NACL 1000-0.7 MG/100ML-% IV SOLN
1000.0000 mg | Freq: Once | INTRAVENOUS | Status: AC
  Administered 2021-11-22: 1000 mg via INTRAVENOUS

## 2021-11-22 MED ORDER — FENTANYL CITRATE (PF) 100 MCG/2ML IJ SOLN
INTRAMUSCULAR | Status: AC
  Administered 2021-11-22: 50 ug via INTRAVENOUS
  Filled 2021-11-22: qty 2

## 2021-11-22 MED ORDER — BUPIVACAINE HCL (PF) 0.25 % IJ SOLN
INTRAMUSCULAR | Status: DC | PRN
  Administered 2021-11-22: 60 mL

## 2021-11-22 MED ORDER — MIDAZOLAM HCL 5 MG/5ML IJ SOLN
INTRAMUSCULAR | Status: DC | PRN
  Administered 2021-11-22: 1.5 mg via INTRAVENOUS

## 2021-11-22 MED ORDER — GLYCOPYRROLATE 0.2 MG/ML IJ SOLN
INTRAMUSCULAR | Status: DC | PRN
  Administered 2021-11-22: .2 mg via INTRAVENOUS

## 2021-11-22 MED ORDER — KETAMINE HCL 50 MG/5ML IJ SOSY
PREFILLED_SYRINGE | INTRAMUSCULAR | Status: AC
Start: 2021-11-22 — End: ?
  Filled 2021-11-22: qty 5

## 2021-11-22 MED ORDER — OXYCODONE HCL 5 MG PO TABS
10.0000 mg | ORAL_TABLET | ORAL | Status: DC | PRN
  Administered 2021-11-23: 10 mg via ORAL
  Filled 2021-11-22: qty 2

## 2021-11-22 MED ORDER — PROPOFOL 1000 MG/100ML IV EMUL
INTRAVENOUS | Status: AC
  Filled 2021-11-22: qty 100

## 2021-11-22 MED ORDER — GABAPENTIN 300 MG PO CAPS
ORAL_CAPSULE | ORAL | Status: AC
  Administered 2021-11-22: 300 mg via ORAL
  Filled 2021-11-22: qty 1

## 2021-11-22 MED ORDER — HYDROMORPHONE HCL 1 MG/ML IJ SOLN
0.5000 mg | INTRAMUSCULAR | Status: DC | PRN
  Administered 2021-11-22: 1 mg via INTRAVENOUS
  Filled 2021-11-22: qty 1

## 2021-11-22 MED ORDER — DROPERIDOL 2.5 MG/ML IJ SOLN: 0.6250 mg | Freq: Once | INTRAMUSCULAR | Status: DC | PRN

## 2021-11-22 MED ORDER — PHENYLEPHRINE HCL-NACL 20-0.9 MG/250ML-% IV SOLN
INTRAVENOUS | Status: DC | PRN
  Administered 2021-11-22: 30 ug/min via INTRAVENOUS

## 2021-11-22 MED ORDER — ROCURONIUM BROMIDE 100 MG/10ML IV SOLN
INTRAVENOUS | Status: DC | PRN
  Administered 2021-11-22: 20 mg via INTRAVENOUS
  Administered 2021-11-22: 70 mg via INTRAVENOUS
  Administered 2021-11-22 (×2): 10 mg via INTRAVENOUS

## 2021-11-22 MED ORDER — FENTANYL CITRATE (PF) 100 MCG/2ML IJ SOLN
INTRAMUSCULAR | Status: DC | PRN
Start: 2021-11-22 — End: 2021-11-22
  Administered 2021-11-22: 25 ug via INTRAVENOUS
  Administered 2021-11-22: 50 ug via INTRAVENOUS
  Administered 2021-11-22: 25 ug via INTRAVENOUS
  Administered 2021-11-22 (×2): 50 ug via INTRAVENOUS

## 2021-11-22 MED ORDER — BISACODYL 10 MG RE SUPP: 10.0000 mg | Freq: Every day | RECTAL | Status: DC | PRN

## 2021-11-22 MED ORDER — SODIUM CHLORIDE 0.9 % IR SOLN
Status: DC | PRN
  Administered 2021-11-22: 3000 mL

## 2021-11-22 MED ORDER — ONDANSETRON HCL 4 MG PO TABS
4.0000 mg | ORAL_TABLET | Freq: Four times a day (QID) | ORAL | Status: DC | PRN
  Administered 2021-11-23: 4 mg via ORAL
  Filled 2021-11-22: qty 1

## 2021-11-22 MED ORDER — ONDANSETRON HCL 4 MG/2ML IJ SOLN
INTRAMUSCULAR | Status: DC | PRN
  Administered 2021-11-22: 4 mg via INTRAVENOUS

## 2021-11-22 MED ORDER — OXYCODONE HCL 5 MG PO TABS
5.0000 mg | ORAL_TABLET | Freq: Once | ORAL | Status: AC | PRN
  Administered 2021-11-22: 5 mg via ORAL

## 2021-11-22 MED ORDER — BUPIVACAINE HCL (PF) 0.25 % IJ SOLN
INTRAMUSCULAR | Status: AC
  Filled 2021-11-22: qty 60

## 2021-11-22 MED ORDER — SUCCINYLCHOLINE CHLORIDE 200 MG/10ML IV SOSY
PREFILLED_SYRINGE | INTRAVENOUS | Status: DC | PRN
  Administered 2021-11-22: 200 mg via INTRAVENOUS

## 2021-11-22 MED ORDER — ADULT MULTIVITAMIN W/MINERALS CH
1.0000 | ORAL_TABLET | Freq: Every day | ORAL | Status: DC
  Administered 2021-11-23: 1 via ORAL
  Filled 2021-11-22: qty 1

## 2021-11-22 MED ORDER — OXYCODONE HCL 5 MG/5ML PO SOLN: 5.0000 mg | Freq: Once | ORAL | Status: AC | PRN

## 2021-11-22 MED ORDER — TERAZOSIN HCL 5 MG PO CAPS
10.0000 mg | ORAL_CAPSULE | Freq: Every day | ORAL | Status: DC
  Administered 2021-11-22: 10 mg via ORAL
  Filled 2021-11-22: qty 2

## 2021-11-22 MED ORDER — PROPOFOL 10 MG/ML IV BOLUS
INTRAVENOUS | Status: DC | PRN
Start: 2021-11-22 — End: 2021-11-22
  Administered 2021-11-22: 150 mg via INTRAVENOUS

## 2021-11-22 MED ORDER — FAMOTIDINE 20 MG PO TABS
ORAL_TABLET | ORAL | Status: AC
  Administered 2021-11-22: 20 mg
  Filled 2021-11-22: qty 1

## 2021-11-22 MED ORDER — CELECOXIB 200 MG PO CAPS
200.0000 mg | ORAL_CAPSULE | Freq: Two times a day (BID) | ORAL | Status: DC
  Administered 2021-11-23: 200 mg via ORAL
  Filled 2021-11-22: qty 1

## 2021-11-22 MED ORDER — CELECOXIB 200 MG PO CAPS
ORAL_CAPSULE | ORAL | Status: AC
  Filled 2021-11-22: qty 2

## 2021-11-22 MED ORDER — ROSUVASTATIN CALCIUM 5 MG PO TABS
5.0000 mg | ORAL_TABLET | Freq: Every day | ORAL | Status: DC
  Administered 2021-11-23: 5 mg via ORAL
  Filled 2021-11-22: qty 1

## 2021-11-22 MED ORDER — KETAMINE HCL 10 MG/ML IJ SOLN
INTRAMUSCULAR | Status: DC | PRN
  Administered 2021-11-22: 30 mg via INTRAVENOUS
  Administered 2021-11-22: 10 mg via INTRAVENOUS

## 2021-11-22 MED ORDER — SODIUM CHLORIDE FLUSH 0.9 % IV SOLN
INTRAVENOUS | Status: AC
  Filled 2021-11-22: qty 40

## 2021-11-22 MED ORDER — MAGNESIUM HYDROXIDE 400 MG/5ML PO SUSP
30.0000 mL | Freq: Every day | ORAL | Status: DC
  Administered 2021-11-22 – 2021-11-23 (×2): 30 mL via ORAL
  Filled 2021-11-22 (×2): qty 30

## 2021-11-22 MED ORDER — ENOXAPARIN SODIUM 30 MG/0.3ML IJ SOSY
30.0000 mg | PREFILLED_SYRINGE | Freq: Two times a day (BID) | INTRAMUSCULAR | Status: DC
  Administered 2021-11-23: 30 mg via SUBCUTANEOUS
  Filled 2021-11-22: qty 0.3

## 2021-11-22 MED ORDER — SURGIPHOR WOUND IRRIGATION SYSTEM - OPTIME
TOPICAL | Status: DC | PRN
  Administered 2021-11-22: 450 mL via TOPICAL

## 2021-11-22 MED ORDER — ACETAMINOPHEN 10 MG/ML IV SOLN
1000.0000 mg | Freq: Four times a day (QID) | INTRAVENOUS | Status: DC
  Administered 2021-11-22 – 2021-11-23 (×3): 1000 mg via INTRAVENOUS
  Filled 2021-11-22 (×4): qty 100

## 2021-11-22 MED ORDER — FENTANYL CITRATE (PF) 100 MCG/2ML IJ SOLN
25.0000 ug | INTRAMUSCULAR | Status: DC | PRN
  Administered 2021-11-22 (×2): 50 ug via INTRAVENOUS

## 2021-11-22 MED ORDER — ENSURE PRE-SURGERY PO LIQD
296.0000 mL | Freq: Once | ORAL | Status: AC
  Administered 2021-11-22: 296 mL via ORAL
  Filled 2021-11-22: qty 296

## 2021-11-22 MED ORDER — MIDAZOLAM HCL 2 MG/2ML IJ SOLN
INTRAMUSCULAR | Status: AC
  Filled 2021-11-22: qty 2

## 2021-11-22 MED ORDER — VITAMIN B-12 1000 MCG PO TABS
2500.0000 ug | ORAL_TABLET | Freq: Every day | ORAL | Status: DC
  Administered 2021-11-23: 2500 ug via ORAL
  Filled 2021-11-22 (×2): qty 3

## 2021-11-22 MED ORDER — CEFAZOLIN SODIUM-DEXTROSE 1-4 GM/50ML-% IV SOLN
INTRAVENOUS | Status: DC | PRN
  Administered 2021-11-22: 1 g via INTRAVENOUS

## 2021-11-22 MED ORDER — PHENYLEPHRINE HCL (PRESSORS) 10 MG/ML IV SOLN
INTRAVENOUS | Status: AC
  Filled 2021-11-22: qty 1

## 2021-11-22 MED ORDER — PANTOPRAZOLE SODIUM 40 MG PO TBEC
40.0000 mg | DELAYED_RELEASE_TABLET | Freq: Two times a day (BID) | ORAL | Status: DC
  Administered 2021-11-22 – 2021-11-23 (×3): 40 mg via ORAL
  Filled 2021-11-22 (×3): qty 1

## 2021-11-22 MED ORDER — DIPHENHYDRAMINE HCL 12.5 MG/5ML PO ELIX: 12.5000 mg | ORAL_SOLUTION | ORAL | Status: DC | PRN

## 2021-11-22 MED ORDER — EPHEDRINE SULFATE (PRESSORS) 50 MG/ML IJ SOLN
INTRAMUSCULAR | Status: DC | PRN
  Administered 2021-11-22: 5 mg via INTRAVENOUS

## 2021-11-22 MED ORDER — TRAMADOL HCL 50 MG PO TABS
50.0000 mg | ORAL_TABLET | ORAL | Status: DC | PRN
  Administered 2021-11-22: 100 mg via ORAL
  Filled 2021-11-22: qty 2

## 2021-11-22 MED ORDER — PHENOL 1.4 % MT LIQD: 1.0000 | OROMUCOSAL | Status: DC | PRN

## 2021-11-22 MED ORDER — FLUOXETINE HCL 20 MG PO CAPS
20.0000 mg | ORAL_CAPSULE | Freq: Every day | ORAL | Status: DC
  Administered 2021-11-23: 20 mg via ORAL
  Filled 2021-11-22: qty 1

## 2021-11-22 MED ORDER — SUGAMMADEX SODIUM 500 MG/5ML IV SOLN
INTRAVENOUS | Status: DC | PRN
  Administered 2021-11-22: 400 mg via INTRAVENOUS

## 2021-11-22 MED ORDER — MENTHOL 3 MG MT LOZG: 1.0000 | LOZENGE | OROMUCOSAL | Status: DC | PRN

## 2021-11-22 MED ORDER — ACETAMINOPHEN 10 MG/ML IV SOLN: 1000.0000 mg | Freq: Once | INTRAVENOUS | Status: DC | PRN

## 2021-11-22 MED ORDER — RISAQUAD PO CAPS
1.0000 | ORAL_CAPSULE | Freq: Every day | ORAL | Status: DC
Start: 2021-11-22 — End: 2021-11-23
  Administered 2021-11-23: 1 via ORAL
  Filled 2021-11-22: qty 1

## 2021-11-22 MED ORDER — FERROUS SULFATE 325 (65 FE) MG PO TABS
325.0000 mg | ORAL_TABLET | Freq: Two times a day (BID) | ORAL | Status: DC
  Administered 2021-11-23: 325 mg via ORAL
  Filled 2021-11-22: qty 1

## 2021-11-22 MED ORDER — VITAMIN D 25 MCG (1000 UNIT) PO TABS
500.0000 [IU] | ORAL_TABLET | Freq: Every day | ORAL | Status: DC
  Administered 2021-11-23: 500 [IU] via ORAL
  Filled 2021-11-22: qty 1

## 2021-11-22 MED ORDER — NEOMYCIN-POLYMYXIN B GU 40-200000 IR SOLN
Status: DC | PRN
  Administered 2021-11-22: 14 mL

## 2021-11-22 MED ORDER — ALUM & MAG HYDROXIDE-SIMETH 200-200-20 MG/5ML PO SUSP: 30.0000 mL | ORAL | Status: DC | PRN

## 2021-11-22 MED ORDER — PROPOFOL 10 MG/ML IV BOLUS
INTRAVENOUS | Status: AC
  Filled 2021-11-22: qty 60

## 2021-11-22 MED ORDER — VITAMIN B-6 50 MG PO TABS
100.0000 mg | ORAL_TABLET | Freq: Every day | ORAL | Status: DC
  Administered 2021-11-23: 100 mg via ORAL
  Filled 2021-11-22 (×2): qty 2

## 2021-11-22 MED ORDER — PROMETHAZINE HCL 25 MG/ML IJ SOLN
6.2500 mg | INTRAMUSCULAR | Status: DC | PRN
  Administered 2021-11-22: 6.25 mg via INTRAVENOUS

## 2021-11-22 MED ORDER — FLEET ENEMA 7-19 GM/118ML RE ENEM: 1.0000 | ENEMA | Freq: Once | RECTAL | Status: DC | PRN

## 2021-11-22 MED ORDER — LOSARTAN POTASSIUM 25 MG PO TABS
25.0000 mg | ORAL_TABLET | Freq: Every day | ORAL | Status: DC
  Administered 2021-11-23: 25 mg via ORAL
  Filled 2021-11-22: qty 1

## 2021-11-22 MED ORDER — SENNOSIDES-DOCUSATE SODIUM 8.6-50 MG PO TABS
1.0000 | ORAL_TABLET | Freq: Two times a day (BID) | ORAL | Status: DC
  Administered 2021-11-22 – 2021-11-23 (×3): 1 via ORAL
  Filled 2021-11-22 (×3): qty 1

## 2021-11-22 MED ORDER — METOCLOPRAMIDE HCL 10 MG PO TABS
10.0000 mg | ORAL_TABLET | Freq: Three times a day (TID) | ORAL | Status: DC
Start: 2021-11-22 — End: 2021-11-23
  Administered 2021-11-22 – 2021-11-23 (×3): 10 mg via ORAL
  Filled 2021-11-22 (×8): qty 1

## 2021-11-22 MED ORDER — BUPIVACAINE LIPOSOME 1.3 % IJ SUSP
INTRAMUSCULAR | Status: AC
  Filled 2021-11-22: qty 20

## 2021-11-22 MED ORDER — 0.9 % SODIUM CHLORIDE (POUR BTL) OPTIME
TOPICAL | Status: DC | PRN
  Administered 2021-11-22: 500 mL

## 2021-11-22 MED ORDER — LIDOCAINE HCL (CARDIAC) PF 100 MG/5ML IV SOSY
PREFILLED_SYRINGE | INTRAVENOUS | Status: DC | PRN
  Administered 2021-11-22: 100 mg via INTRAVENOUS

## 2021-11-22 MED ORDER — ACETAMINOPHEN 10 MG/ML IV SOLN
INTRAVENOUS | Status: DC | PRN
  Administered 2021-11-22: 1000 mg via INTRAVENOUS

## 2021-11-22 MED ORDER — SODIUM CHLORIDE 0.9 % IV SOLN: INTRAVENOUS | Status: DC

## 2021-11-22 SURGICAL SUPPLY — 78 items
ATTUNE MED DOME PAT 41 KNEE (Knees) ×1 IMPLANT
ATTUNE PS FEM LT SZ 8 CEM KNEE (Femur) ×1 IMPLANT
ATTUNE PSRP INSR SZ8 5 KNEE (Insert) ×1 IMPLANT
BASE TIBIAL ROT PLAT SZ 8 KNEE (Knees) IMPLANT
BATTERY INSTRU NAVIGATION (MISCELLANEOUS) ×8 IMPLANT
BLADE SAW 70X12.5 (BLADE) ×2 IMPLANT
BLADE SAW 90X13X1.19 OSCILLAT (BLADE) ×2 IMPLANT
BLADE SAW 90X25X1.19 OSCILLAT (BLADE) ×2 IMPLANT
BONE CEMENT GENTAMICIN (Cement) ×4 IMPLANT
CEMENT BONE GENTAMICIN 40 (Cement) IMPLANT
COOLER POLAR GLACIER W/PUMP (MISCELLANEOUS) ×2 IMPLANT
CUFF TOURN SGL QUICK 24 (TOURNIQUET CUFF)
CUFF TOURN SGL QUICK 34 (TOURNIQUET CUFF)
CUFF TRNQT CYL 24X4X16.5-23 (TOURNIQUET CUFF) IMPLANT
CUFF TRNQT CYL 34X4.125X (TOURNIQUET CUFF) IMPLANT
DRAPE 3/4 80X56 (DRAPES) ×2 IMPLANT
DRAPE INCISE IOBAN 66X45 STRL (DRAPES) ×2 IMPLANT
DRSG DERMACEA 8X12 NADH (GAUZE/BANDAGES/DRESSINGS) ×2 IMPLANT
DRSG MEPILEX SACRM 8.7X9.8 (GAUZE/BANDAGES/DRESSINGS) ×2 IMPLANT
DRSG OPSITE POSTOP 4X14 (GAUZE/BANDAGES/DRESSINGS) ×2 IMPLANT
DRSG TEGADERM 4X4.75 (GAUZE/BANDAGES/DRESSINGS) ×2 IMPLANT
DURAPREP 26ML APPLICATOR (WOUND CARE) ×4 IMPLANT
ELECT CAUTERY BLADE 6.4 (BLADE) ×2 IMPLANT
ELECT REM PT RETURN 9FT ADLT (ELECTROSURGICAL) ×2
ELECTRODE REM PT RTRN 9FT ADLT (ELECTROSURGICAL) ×1 IMPLANT
EX-PIN ORTHOLOCK NAV 4X150 (PIN) ×4 IMPLANT
GLOVE BIOGEL PI IND STRL 8 (GLOVE) ×1 IMPLANT
GLOVE BIOGEL PI INDICATOR 8 (GLOVE) ×1
GLOVE SURG ENC TEXT LTX SZ7.5 (GLOVE) ×8 IMPLANT
GLOVE SURG UNDER POLY LF SZ7.5 (GLOVE) ×2 IMPLANT
GOWN STRL REUS W/ TWL LRG LVL3 (GOWN DISPOSABLE) ×2 IMPLANT
GOWN STRL REUS W/ TWL XL LVL3 (GOWN DISPOSABLE) ×1 IMPLANT
GOWN STRL REUS W/TWL LRG LVL3 (GOWN DISPOSABLE) ×2
GOWN STRL REUS W/TWL XL LVL3 (GOWN DISPOSABLE) ×1
HEMOVAC 400CC 10FR (MISCELLANEOUS) ×2 IMPLANT
HOLDER FOLEY CATH W/STRAP (MISCELLANEOUS) ×2 IMPLANT
HOLSTER ELECTROSUGICAL PENCIL (MISCELLANEOUS) ×2 IMPLANT
HOOD PEEL AWAY FLYTE STAYCOOL (MISCELLANEOUS) ×4 IMPLANT
IV NS IRRIG 3000ML ARTHROMATIC (IV SOLUTION) ×2 IMPLANT
KIT TURNOVER KIT A (KITS) ×2 IMPLANT
KNIFE SCULPS 14X20 (INSTRUMENTS) ×2 IMPLANT
LABEL OR SOLS (LABEL) ×2 IMPLANT
MANIFOLD NEPTUNE II (INSTRUMENTS) ×4 IMPLANT
NDL SAFETY ECLIPSE 18X1.5 (NEEDLE) ×1 IMPLANT
NDL SPNL 20GX3.5 QUINCKE YW (NEEDLE) ×2 IMPLANT
NEEDLE HYPO 18GX1.5 SHARP (NEEDLE) ×1
NEEDLE SPNL 20GX3.5 QUINCKE YW (NEEDLE) ×4 IMPLANT
NS IRRIG 500ML POUR BTL (IV SOLUTION) ×2 IMPLANT
PACK TOTAL KNEE (MISCELLANEOUS) ×2 IMPLANT
PAD ABD DERMACEA PRESS 5X9 (GAUZE/BANDAGES/DRESSINGS) ×4 IMPLANT
PAD WRAPON POLOR MULTI XL (MISCELLANEOUS) IMPLANT
PENCIL SMOKE EVACUATOR COATED (MISCELLANEOUS) ×2 IMPLANT
PIN DRILL FIX HALF THREAD (BIT) ×4 IMPLANT
PIN DRILL QUICK PACK ×4 IMPLANT
PIN FIXATION 1/8DIA X 3INL (PIN) ×2 IMPLANT
PULSAVAC PLUS IRRIG FAN TIP (DISPOSABLE) ×2
SOL PREP PVP 2OZ (MISCELLANEOUS) ×2
SOLUTION IRRIG SURGIPHOR (IV SOLUTION) ×2 IMPLANT
SOLUTION PREP PVP 2OZ (MISCELLANEOUS) ×1 IMPLANT
SPONGE DRAIN TRACH 4X4 STRL 2S (GAUZE/BANDAGES/DRESSINGS) ×2 IMPLANT
STAPLER SKIN PROX 35W (STAPLE) ×2 IMPLANT
STOCKINETTE IMPERV 14X48 (MISCELLANEOUS) ×1 IMPLANT
STRAP TIBIA SHORT (MISCELLANEOUS) ×2 IMPLANT
SUCTION FRAZIER HANDLE 10FR (MISCELLANEOUS) ×1
SUCTION TUBE FRAZIER 10FR DISP (MISCELLANEOUS) ×1 IMPLANT
SUT VIC AB 0 CT1 36 (SUTURE) ×4 IMPLANT
SUT VIC AB 1 CT1 36 (SUTURE) ×4 IMPLANT
SUT VIC AB 2-0 CT2 27 (SUTURE) ×2 IMPLANT
SYR 20ML LL LF (SYRINGE) ×2 IMPLANT
SYR 30ML LL (SYRINGE) ×4 IMPLANT
TIBIAL BASE ROT PLAT SZ 8 KNEE (Knees) ×2 IMPLANT
TIP FAN IRRIG PULSAVAC PLUS (DISPOSABLE) ×1 IMPLANT
TOWEL OR 17X26 4PK STRL BLUE (TOWEL DISPOSABLE) ×2 IMPLANT
TOWER CARTRIDGE SMART MIX (DISPOSABLE) ×2 IMPLANT
TRAY FOLEY MTR SLVR 16FR STAT (SET/KITS/TRAYS/PACK) ×2 IMPLANT
WATER STERILE IRR 1000ML POUR (IV SOLUTION) ×2 IMPLANT
WRAP-ON POLOR PAD MULTI XL (MISCELLANEOUS) ×1
WRAPON POLOR PAD MULTI XL (MISCELLANEOUS) ×1

## 2021-11-22 NOTE — Plan of Care (Signed)
?  Problem: Education: ?Goal: Knowledge of General Education information will improve ?Description: Including pain rating scale, medication(s)/side effects and non-pharmacologic comfort measures ?Outcome: Progressing ?  ?Problem: Health Behavior/Discharge Planning: ?Goal: Ability to manage health-related needs will improve ?Outcome: Progressing ?  ?Problem: Clinical Measurements: ?Goal: Ability to maintain clinical measurements within normal limits will improve ?Outcome: Progressing ?Goal: Will remain free from infection ?Outcome: Progressing ?Goal: Diagnostic test results will improve ?Outcome: Progressing ?Goal: Respiratory complications will improve ?Outcome: Progressing ?Goal: Cardiovascular complication will be avoided ?Outcome: Progressing ?  ?Problem: Activity: ?Goal: Risk for activity intolerance will decrease ?Outcome: Progressing ?  ?Problem: Nutrition: ?Goal: Adequate nutrition will be maintained ?Outcome: Progressing ?  ?Problem: Coping: ?Goal: Level of anxiety will decrease ?Outcome: Progressing ?  ?Problem: Elimination: ?Goal: Will not experience complications related to bowel motility ?Outcome: Progressing ?Goal: Will not experience complications related to urinary retention ?Outcome: Progressing ?  ?Problem: Pain Managment: ?Goal: General experience of comfort will improve ?Outcome: Progressing ?  ?Problem: Safety: ?Goal: Ability to remain free from injury will improve ?Outcome: Progressing ?  ?Problem: Skin Integrity: ?Goal: Risk for impaired skin integrity will decrease ?Outcome: Progressing ?  ?Problem: Education: ?Goal: Knowledge of the prescribed therapeutic regimen will improve ?Outcome: Progressing ?Goal: Individualized Educational Video(s) ?Outcome: Progressing ?  ?Problem: Clinical Measurements: ?Goal: Postoperative complications will be avoided or minimized ?Outcome: Progressing ?  ?Problem: Pain Management: ?Goal: Pain level will decrease with appropriate interventions ?Outcome:  Progressing ?  ?Problem: Skin Integrity: ?Goal: Will show signs of wound healing ?Outcome: Progressing ?  ?

## 2021-11-22 NOTE — Anesthesia Procedure Notes (Signed)
Procedure Name: Intubation ?Date/Time: 11/22/2021 7:45 AM ?Performed by: Lowry Bowl, CRNA ?Pre-anesthesia Checklist: Patient identified, Emergency Drugs available, Suction available and Patient being monitored ?Patient Re-evaluated:Patient Re-evaluated prior to induction ?Oxygen Delivery Method: Circle system utilized ?Preoxygenation: Pre-oxygenation with 100% oxygen ?Induction Type: IV induction, Cricoid Pressure applied and Rapid sequence ?Laryngoscope Size: McGraph and 4 ?Grade View: Grade I ?Tube type: Oral ?Tube size: 7.5 mm ?Number of attempts: 1 ?Airway Equipment and Method: Stylet and Video-laryngoscopy ?Placement Confirmation: ETT inserted through vocal cords under direct vision, positive ETCO2 and breath sounds checked- equal and bilateral ?Secured at: 23 cm ?Tube secured with: Tape ?Dental Injury: Teeth and Oropharynx as per pre-operative assessment  ?Comments: RSI - did not attempt to ventilate ? ? ? ? ?

## 2021-11-22 NOTE — Op Note (Signed)
OPERATIVE NOTE ? ?DATE OF SURGERY:  11/22/2021 ? ?PATIENT NAME:  Jimmy Norton.   ?DOB: 1946-01-26  ?MRN: [id number] ? ?PRE-OPERATIVE DIAGNOSIS: Degenerative arthrosis of the left knee, primary ? ?POST-OPERATIVE DIAGNOSIS:  Same ? ?PROCEDURE:  Left total knee arthroplasty using computer-assisted navigation ? ?SURGEON:  Marciano Sequin. M.D. ? ?ASSISTANT: Cassell Smiles, PA-C (present and scrubbed throughout the case, critical for assistance with exposure, retraction, instrumentation, and closure) ? ?ANESTHESIA: general ? ?ESTIMATED BLOOD LOSS: 50 mL ? ?FLUIDS REPLACED: 800 mL of crystalloid ? ?TOURNIQUET TIME: 93 minutes ? ?DRAINS: 2 medium Hemovac drains ? ?SOFT TISSUE RELEASES: Anterior cruciate ligament, posterior cruciate ligament, deep and superficial medial collateral ligament, patellofemoral ligament ? ?IMPLANTS UTILIZED: DePuy Attune size 8 posterior stabilized femoral component (cemented), size 8 rotating platform tibial component (cemented), 41 mm medialized dome patella (cemented), and a 5 mm stabilized rotating platform polyethylene insert. ? ?INDICATIONS FOR SURGERY: Jimmy Norton. is a 76 y.o. year old male with a long history of progressive knee pain. X-rays demonstrated severe degenerative changes in tricompartmental fashion. The patient had not seen any significant improvement despite conservative nonsurgical intervention. After discussion of the risks and benefits of surgical intervention, the patient expressed understanding of the risks benefits and agree with plans for total knee arthroplasty.  ? ?The risks, benefits, and alternatives were discussed at length including but not limited to the risks of infection, bleeding, nerve injury, stiffness, blood clots, the need for revision surgery, cardiopulmonary complications, among others, and they were willing to proceed. ? ?PROCEDURE IN DETAIL: The patient was brought into the operating room and, after adequate general anesthesia was achieved, a  tourniquet was placed on the patient's upper thigh. The patient's knee and leg were cleaned and prepped with alcohol and DuraPrep and draped in the usual sterile fashion. A "timeout" was performed as per usual protocol. The lower extremity was exsanguinated using an Esmarch, and the tourniquet was inflated to 300 mmHg. An anterior longitudinal incision was made followed by a standard mid vastus approach. The deep fibers of the medial collateral ligament were elevated in a subperiosteal fashion off of the medial flare of the tibia so as to maintain a continuous soft tissue sleeve. The patella was subluxed laterally and the patellofemoral ligament was incised. Inspection of the knee demonstrated severe degenerative changes with full-thickness loss of articular cartilage. Osteophytes were debrided using a rongeur. Anterior and posterior cruciate ligaments were excised. Two 4.0 mm Schanz pins were inserted in the femur and into the tibia for attachment of the array of trackers used for computer-assisted navigation. Hip center was identified using a circumduction technique. Distal landmarks were mapped using the computer. The distal femur and proximal tibia were mapped using the computer. The distal femoral cutting guide was positioned using computer-assisted navigation so as to achieve a 5? distal valgus cut. The femur was sized and it was felt that a size 8 femoral component was appropriate. A size 8 femoral cutting guide was positioned and the anterior cut was performed and verified using the computer. This was followed by completion of the posterior and chamfer cuts. Femoral cutting guide for the central box was then positioned in the center box cut was performed. ? ?Attention was then directed to the proximal tibia. Medial and lateral menisci were excised. The extramedullary tibial cutting guide was positioned using computer-assisted navigation so as to achieve a 0? varus-valgus alignment and 3? posterior slope. The  cut was performed and verified using the computer.  The proximal tibia was sized and it was felt that a size 8 tibial tray was appropriate. Tibial and femoral trials were inserted followed by insertion of a 5 mm polyethylene insert. The knee was felt to be tight medially. A Cobb elevator was used to elevate the superficial fibers of the medial collateral ligament.  This allowed for excellent mediolateral soft tissue balancing both in flexion and in full extension. Finally, the patella was cut and prepared so as to accommodate a 41 mm medialized dome patella. A patella trial was placed and the knee was placed through a range of motion with excellent patellar tracking appreciated. The femoral trial was removed after debridement of posterior osteophytes. The central post-hole for the tibial component was reamed followed by insertion of a keel punch. Tibial trials were then removed. Cut surfaces of bone were irrigated with copious amounts of normal saline using pulsatile lavage and then suctioned dry. Polymethylmethacrylate cement with gentamicin was prepared in the usual fashion using a vacuum mixer. Cement was applied to the cut surface of the proximal tibia as well as along the undersurface of a size 8 rotating platform tibial component. Tibial component was positioned and impacted into place. Excess cement was removed using [employer] fast streamer. Cement was then applied to the cut surfaces of the femur as well as along the posterior flanges of the size 8 femoral component. The femoral component was positioned and impacted into place. Excess cement was removed using [employer] fast streamer. A 5 mm polyethylene trial was inserted and the knee was brought into full extension with steady axial compression applied. Finally, cement was applied to the backside of a 41 mm medialized dome patella and the patellar component was positioned and patellar clamp applied. Excess cement was removed using [employer] fast streamer. After adequate curing of  the cement, the tourniquet was deflated after a total tourniquet time of 93 minutes. Hemostasis was achieved using electrocautery. The knee was irrigated with copious amounts of normal saline using pulsatile lavage followed by 450 ml of Surgiphor and then suctioned dry. 20 mL of 1.3% Exparel and 60 mL of 0.25% Marcaine in 40 mL of normal saline was injected along the posterior capsule, medial and lateral gutters, and along the arthrotomy site. A 5 mm stabilized rotating platform polyethylene insert was inserted and the knee was placed through a range of motion with excellent mediolateral soft tissue balancing appreciated and excellent patellar tracking noted. 2 medium drains were placed in the wound bed and brought out through separate stab incisions. The medial parapatellar portion of the incision was reapproximated using interrupted sutures of #1 Vicryl. Subcutaneous tissue was approximated in layers using first #0 Vicryl followed #2-0 Vicryl. The skin was approximated with skin staples. A sterile dressing was applied. ? ?The patient tolerated the procedure well and was transported to the recovery room in stable condition.   ? ?Jessi Pitstick P. Holley Bouche., M.D.  ?

## 2021-11-22 NOTE — TOC Progression Note (Signed)
Transition of Care (TOC) - Progression Note  ? ? ?Patient Details  ?Name: Jimmy Norton. ?MRN: [id number] ?Date of Birth: 12/02/1945 ? ?Transition of Care (TOC) CM/SW Contact  ?Conception Oms, RN ?Phone Number: ?11/22/2021, 3:13 PM ? ?Clinical Narrative:    ?The patient is open to [place] [facility] services, prior to [facility] office PT to eval ? ? ?  ?  ? ?Expected Discharge Plan and Services ?  ?  ?  ?  ?  ?                ?  ?  ?  ?  ?  ?  ?  ?  ?  ?  ? ? ?Social Determinants of Health (SDOH) Interventions ?  ? ?Readmission Risk Interventions ?   ? View : No data to display.  ?  ?  ?  ? ? ?

## 2021-11-22 NOTE — Transfer of Care (Signed)
Immediate Anesthesia Transfer of Care Note ? ?Patient: Jimmy Norton. ? ?Procedure(s) Performed: COMPUTER ASSISTED TOTAL KNEE ARTHROPLASTY (Left: Knee) ? ?Patient Location: PACU ? ?Anesthesia Type:General ? ?Level of Consciousness: awake ? ?Airway & Oxygen Therapy: Patient Spontanous Breathing and Patient connected to face mask oxygen ? ?Post-op Assessment: Report given to RN and Post -op Vital signs reviewed and stable ? ?Post vital signs: Reviewed ? ?Last Vitals:  ?Vitals Value Taken Time  ?BP 138/71 11/22/21 1120  ?Temp    ?Pulse 79 11/22/21 1123  ?Resp 25 11/22/21 1123  ?SpO2 97 % 11/22/21 1123  ?Vitals shown include unvalidated device data. ? ?Last Pain:  ?Vitals:  ? 11/22/21 0636  ?TempSrc: Oral  ?PainSc: 0-No pain  ?   ? ?  ? ?Complications: No notable events documented. ?

## 2021-11-22 NOTE — H&P (Signed)
The patient has been re-examined, and the chart reviewed, and there have been no interval changes to the documented history and physical.    The risks, benefits, and alternatives have been discussed at length. The patient expressed understanding of the risks benefits and agreed with plans for surgical intervention.  Osamu Olguin P. Holly Pring, [name] M.D.    

## 2021-11-22 NOTE — Evaluation (Signed)
Physical Therapy Evaluation ?Patient Details ?Name: Jimmy Norton. ?MRN: [id number] ?DOB: [birth year] ?Today's Date: 11/22/2021 ? ?History of Present Illness ? Pt admitted for L TKR. History includes anxiety, HLD, and HTN. Pt is POD 0 at time of evaluation. History of R TKR.  ?Clinical Impression ? Pt is a pleasant 76 year old male who was admitted for L TKR. Pt performs bed mobility with mod assist, transfers with min assist, and ambulation with min assist and RW. Pt demonstrates ability to perform 10 SLRs with independence, therefore does not require KI for mobility. Pt demonstrates deficits with strength/mobility. 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 Would benefit from skilled PT to address above deficits and promote optimal return to PLOF. Recommend transition to [place] upon discharge from acute hospitalization. ? ?   ? ?Recommendations for follow up therapy are one component of a multi-disciplinary discharge planning process, led by the attending physician.  Recommendations may be updated based on patient status, additional functional criteria and insurance authorization. ? ?Follow Up Recommendations Home health PT ? ?  ?Assistance Recommended at Discharge Intermittent Supervision/Assistance  ?Patient can return home with the following ? A lot of help with walking and/or transfers;A little help with bathing/dressing/bathroom;Help with stairs or ramp for entrance;Assist for transportation ? ?  ?Equipment Recommendations Rolling walker (2 wheels)  ?Recommendations for Other Services ?    ?  ?Functional Status Assessment Patient has had a recent decline in their functional status and demonstrates the ability to make significant improvements in function in a reasonable and predictable amount of time.  ? ?  ?Precautions / Restrictions Precautions ?Precautions: Fall ?Restrictions ?Weight Bearing Restrictions: Yes ?LLE Weight Bearing: Weight bearing as tolerated  ? ?   ? ?Mobility ? Bed Mobility ?Overal bed mobility: Needs Assistance ?Bed Mobility: Supine to Sit ?  ?  ?Supine to sit: Mod assist ?  ?  ?General bed mobility comments: safe technique with ability to follow commands well. Needs assist for B UE and L LE. Once seated, upright posture noted. Slight dizziness, however improves with time. ?  ? ?Transfers ?Overall transfer level: Needs assistance ?Equipment used: Rolling walker (2 wheels) ?Transfers: Sit to/from Stand ?Sit to Stand: Min assist ?  ?  ?  ?  ?  ?General transfer comment: cues for hand placement. Wide BOS. ONce standing, upright posture noted with heavy WBing on B UE ?  ? ?Ambulation/Gait ?Ambulation/Gait assistance: Min assist ?Gait Distance (Feet): 8 Feet ?Assistive device: Rolling walker (2 wheels) ?Gait Pattern/deviations: Step-to pattern ?  ?  ?  ?General Gait Details: limited ambulation in room due to sensation not fully intact in surgical leg. Noted increased/uncontrolled "pounding" during [place] on R LE. Encouraged to offset with B UE WBing. Moderate pain noted throughout. ? ?Stairs ?  ?  ?  ?  ?  ? ?Wheelchair Mobility ?  ? ?Modified Rankin (Stroke Patients Only) ?  ? ?  ? ?Balance Overall balance assessment: Needs assistance ?Sitting-balance support: Feet supported, Bilateral upper extremity supported ?Sitting balance-Leahy Scale: Good ?  ?  ?Standing balance support: Bilateral upper extremity supported ?Standing balance-Leahy Scale: Good ?  ?  ?  ?  ?  ?  ?  ?  ?  ?  ?  ?  ?   ? ? ? ?Pertinent Vitals/Pain Pain Assessment ?Pain Assessment: 0-10 ?Pain Score: 7  ?Pain Location: L knee ?Pain Descriptors / Indicators: Operative site guarding ?Pain Intervention(s): Limited activity  within patient's tolerance, Repositioned, Ice applied  ? ? ?Home Living Family/patient expects to be discharged to:: Private residence ?Living Arrangements: Spouse/significant other ?Available Help at Discharge: Family ?Type of Home: House ?Home Access: Stairs to enter ?Entrance  Stairs-Rails: Can reach both ?Entrance Stairs-Number of Steps: 6 ?  ?Home Layout: One level ?Home Equipment: Conservation officer, nature (2 wheels);Cane - single point ?   ?  ?Prior Function Prior Level of Function : Independent/Modified Independent ?  ?  ?  ?  ?  ?  ?Mobility Comments: furniture cruises. No recent falls ?ADLs Comments: indep ?  ? ? ?Hand Dominance  ?   ? ?  ?Extremity/Trunk Assessment  ? Upper Extremity Assessment ?Upper Extremity Assessment: Overall WFL for tasks assessed ?  ? ?Lower Extremity Assessment ?Lower Extremity Assessment: Generalized weakness (L LE grossly 3/5; R LE 4/5) ?  ? ?   ?Communication  ? Communication: No difficulties  ?Cognition Arousal/Alertness: Awake/alert ?Behavior During Therapy: [facility] for tasks assessed/performed ?Overall Cognitive Status: Within Functional Limits for tasks assessed ?  ?  ?  ?  ?  ?  ?  ?  ?  ?  ?  ?  ?  ?  ?  ?  ?  ?  ?  ? ?  ?General Comments   ? ?  ?Exercises Total Joint Exercises ?Goniometric ROM: L knee AAROM: 0-70 degrees ?Other Exercises ?Other Exercises: supine ther-ex performed on L LE including AP, quad sets, SLRs, and hip abd/add. 10 reps with min assist required  ? ?Assessment/Plan  ?  ?PT Assessment Patient needs continued PT services  ?PT Problem List Decreased strength;Decreased balance;Decreased mobility;Pain;Decreased range of motion ? ?   ?  ?PT Treatment Interventions DME instruction;Gait training;Stair training;Therapeutic exercise;Balance training   ? ?PT Goals (Current goals can be found in the Care Plan section)  ?Acute Rehab PT Goals ?Patient Stated Goal: to go home ?PT Goal Formulation: With patient ?Time For Goal Achievement: 12/06/21 ?Potential to Achieve Goals: Good ? ?  ?Frequency BID ?  ? ? ?Co-evaluation   ?  ?  ?  ?  ? ? ?  ?AM-PAC PT "6 Clicks" Mobility  ?Outcome Measure Help needed turning from your back to your side while in a flat bed without using bedrails?: A Little ?Help needed moving from lying on your back to sitting on the  side of a flat bed without using bedrails?: A Little ?Help needed moving to and from a bed to a chair (including a wheelchair)?: A Little ?Help needed standing up from a chair using your arms (e.g., wheelchair or bedside chair)?: A Little ?Help needed to walk in hospital room?: A Lot ?Help needed climbing 3-5 steps with a railing? : A Lot ?6 Click Score: 16 ? ?  ?End of Session Equipment Utilized During Treatment: Gait belt ?Activity Tolerance: Patient tolerated treatment well ?Patient left: in chair;with chair alarm set;with SCD's reapplied;with family/visitor present ?Nurse Communication: Mobility status ?PT Visit Diagnosis: Muscle weakness (generalized) (M62.81);Pain ?Pain - Right/Left: Left ?Pain - part of body: Knee ?  ? ?Time: 1524-1600 ?PT Time Calculation (min) (ACUTE ONLY): 36 min ? ? ?Charges:   PT Evaluation ?$PT Eval Low Complexity: 1 Low ?PT Treatments ?$Gait Training: 8-22 mins ?$Therapeutic Exercise: 8-22 mins ?  ?   ? ? ?Greggory Stallion, PT, DPT, GCS ?[phone] ? ? ?Jimmy Norton ?11/22/2021, 4:43 PM ? ?

## 2021-11-23 ENCOUNTER — Encounter: Payer: Self-pay | Admitting: Orthopedic Surgery

## 2021-11-23 DIAGNOSIS — Z96659 Presence of unspecified artificial knee joint: Secondary | ICD-10-CM | POA: Diagnosis not present

## 2021-11-23 DIAGNOSIS — I10 Essential (primary) hypertension: Secondary | ICD-10-CM | POA: Diagnosis not present

## 2021-11-23 DIAGNOSIS — Z87891 Personal history of nicotine dependence: Secondary | ICD-10-CM | POA: Diagnosis not present

## 2021-11-23 DIAGNOSIS — M1712 Unilateral primary osteoarthritis, left knee: Secondary | ICD-10-CM | POA: Diagnosis not present

## 2021-11-23 DIAGNOSIS — Z79899 Other long term (current) drug therapy: Secondary | ICD-10-CM | POA: Diagnosis not present

## 2021-11-23 DIAGNOSIS — Z96651 Presence of right artificial knee joint: Secondary | ICD-10-CM | POA: Diagnosis not present

## 2021-11-23 MED ORDER — TRAMADOL HCL 50 MG PO TABS: 50.0000 mg | ORAL_TABLET | ORAL | 0 refills | Status: DC | PRN

## 2021-11-23 MED ORDER — ENOXAPARIN SODIUM 40 MG/0.4ML IJ SOSY: 40.0000 mg | PREFILLED_SYRINGE | INTRAMUSCULAR | 0 refills | Status: DC

## 2021-11-23 MED ORDER — OXYCODONE HCL 5 MG PO TABS: 5.0000 mg | ORAL_TABLET | ORAL | 0 refills | Status: DC | PRN

## 2021-11-23 MED ORDER — CELECOXIB 200 MG PO CAPS: 200.0000 mg | ORAL_CAPSULE | Freq: Two times a day (BID) | ORAL | 0 refills | Status: DC

## 2021-11-23 NOTE — Anesthesia Postprocedure Evaluation (Signed)
Anesthesia Post Note ? ?Patient: Ledon Snare. ? ?Procedure(s) Performed: COMPUTER ASSISTED TOTAL KNEE ARTHROPLASTY (Left: Knee) ? ?Patient location during evaluation: PACU ?Anesthesia Type: General ?Level of consciousness: awake and alert ?Pain management: pain level controlled ?Vital Signs Assessment: post-procedure vital signs reviewed and stable ?Respiratory status: spontaneous breathing, nonlabored ventilation and respiratory function stable ?Cardiovascular status: blood pressure returned to baseline and stable ?Postop Assessment: no apparent nausea or vomiting ?Anesthetic complications: no ? ? ?No notable events documented. ? ? ?Last Vitals:  ?Vitals:  ? 11/23/21 0024 11/23/21 0437  ?BP: (!) 111/47 (!) 114/50  ?Pulse: (!) 104 73  ?Resp: 17 18  ?Temp: 36.9 ?C 36.8 ?C  ?SpO2: 91% 94%  ?  ?Last Pain:  ?Vitals:  ? 11/23/21 0100  ?TempSrc:   ?PainSc: 3   ? ? ?  ?  ?  ?  ?  ?  ? ?[place] Ouch ? ? ? ? ?

## 2021-11-23 NOTE — Progress Notes (Signed)
?  Subjective: ?1 Day Post-Op Procedure(s) (LRB): ?COMPUTER ASSISTED TOTAL KNEE ARTHROPLASTY (Left) ?Patient reports pain as well-controlled.   ?Patient is well, and has had no acute complaints or problems ?Plan is to go Home after hospital stay. ?Negative for chest pain and shortness of breath ?Fever: no ?Gastrointestinal: negative for nausea and vomiting.  Patient has not had a bowel movement. ? ?Objective: ?Vital signs in last 24 hours: ?Temp:  [97.5 ?F (36.4 ?C)-98.6 ?F (37 ?C)] 97.9 ?F (36.6 ?C) (05/02 0759) ?Pulse Rate:  [64-104] 76 (05/02 0759) ?Resp:  [11-20] 20 (05/02 0759) ?BP: (111-147)/(47-85) 124/67 (05/02 0759) ?SpO2:  [91 %-98 %] 93 % (05/02 0759) ? ?Intake/Output from previous day: ? ?Intake/Output Summary (Last 24 hours) at 11/23/2021 8850 ?Last data filed at 11/23/2021 0400 ?Gross per 24 hour  ?Intake 2448.99 ml  ?Output 1100 ml  ?Net 1348.99 ml  ?  ?Intake/Output this shift: ?No intake/output data recorded. ? ?Labs: ?No results for input(s): HGB in the last 72 hours. ?No results for input(s): WBC, RBC, HCT, PLT in the last 72 hours. ?No results for input(s): NA, K, CL, CO2, BUN, CREATININE, GLUCOSE, CALCIUM in the last 72 hours. ?No results for input(s): LABPT, INR in the last 72 hours. ? ? ?EXAM ?General - Patient is Alert, Appropriate, and Oriented ?Extremity - Neurovascular intact ?Dorsiflexion/Plantar flexion intact ?Compartment soft ?Dressing/Incision -Postoperative dressing remains in place., Polar Care in place and working. , Hemovac in place.  ?Motor Function - intact, moving foot and toes well on exam. Able to perform independent SLR.  ?Cardiovascular- Regular rate and rhythm, no murmurs/rubs/gallops ?Respiratory- Lungs clear to auscultation bilaterally ?Gastrointestinal- soft, nontender, and active bowel sounds ? ? ?Assessment/Plan: ?1 Day Post-Op Procedure(s) (LRB): ?COMPUTER ASSISTED TOTAL KNEE ARTHROPLASTY (Left) ?Principal Problem: ?  Total knee replacement status ? ?Estimated body mass  index is 44.45 kg/m? as calculated from the following: ?  Height as of this encounter: '5\' 9"'$  (1.753 m). ?  Weight as of this encounter: 136.5 kg. ?Advance diet ?Up with therapy ? ? ? ?  ? ?DVT Prophylaxis - Lovenox, Ted hose, and SCDs ?Weight-Bearing as tolerated to left leg ? ?Cassell Smiles, PA-C ?[facility] ?11/23/2021, 8:12 AM ? ?

## 2021-11-23 NOTE — Plan of Care (Signed)
?  Problem: Education: ?Goal: Knowledge of General Education information will improve ?Description: Including pain rating scale, medication(s)/side effects and non-pharmacologic comfort measures ?Outcome: Adequate for Discharge ?  ?Problem: Health Behavior/Discharge Planning: ?Goal: Ability to manage health-related needs will improve ?Outcome: Adequate for Discharge ?  ?Problem: Clinical Measurements: ?Goal: Ability to maintain clinical measurements within normal limits will improve ?Outcome: Adequate for Discharge ?Goal: Will remain free from infection ?Outcome: Adequate for Discharge ?Goal: Diagnostic test results will improve ?Outcome: Adequate for Discharge ?Goal: Respiratory complications will improve ?Outcome: Adequate for Discharge ?Goal: Cardiovascular complication will be avoided ?Outcome: Adequate for Discharge ?  ?Problem: Activity: ?Goal: Risk for activity intolerance will decrease ?Outcome: Adequate for Discharge ?  ?Problem: Nutrition: ?Goal: Adequate nutrition will be maintained ?Outcome: Adequate for Discharge ?  ?Problem: Elimination: ?Goal: Will not experience complications related to bowel motility ?Outcome: Adequate for Discharge ?Goal: Will not experience complications related to urinary retention ?Outcome: Adequate for Discharge ?  ?Problem: Pain Managment: ?Goal: General experience of comfort will improve ?Outcome: Adequate for Discharge ?  ?Problem: Safety: ?Goal: Ability to remain free from injury will improve ?Outcome: Adequate for Discharge ?  ?Problem: Skin Integrity: ?Goal: Risk for impaired skin integrity will decrease ?Outcome: Adequate for Discharge ?  ?Problem: Education: ?Goal: Knowledge of the prescribed therapeutic regimen will improve ?Outcome: Adequate for Discharge ?Goal: Individualized Educational Video(s) ?Outcome: Adequate for Discharge ?  ?Problem: Activity: ?Goal: Ability to avoid complications of mobility impairment will improve ?Outcome: Adequate for Discharge ?Goal:  Range of joint motion will improve ?Outcome: Adequate for Discharge ?  ?Problem: Clinical Measurements: ?Goal: Postoperative complications will be avoided or minimized ?Outcome: Adequate for Discharge ?  ?Problem: Pain Management: ?Goal: Pain level will decrease with appropriate interventions ?Outcome: Adequate for Discharge ?  ?Problem: Skin Integrity: ?Goal: Will show signs of wound healing ?Outcome: Adequate for Discharge ?  ?

## 2021-11-23 NOTE — Progress Notes (Signed)
Physical Therapy Treatment ?Patient Details ?Name: Jimmy Norton. ?MRN: [id number] ?DOB: 25-Dec-1945 ?Today's Date: 11/23/2021 ? ? ?History of Present Illness Pt admitted for L TKR. History includes anxiety, HLD, and HTN. Pt is POD 0 at time of evaluation. History of R TKR. ? ?  ?PT Comments  ? ? Pt is making good progress towards goals with ability to ambulate and perform stair training this date. Safe technique and pt is safe to dc home. Updated RN.   ?Recommendations for follow up therapy are one component of a multi-disciplinary discharge planning process, led by the attending physician.  Recommendations may be updated based on patient status, additional functional criteria and insurance authorization. ? ?Follow Up Recommendations ? Home health PT ?  ?  ?Assistance Recommended at Discharge Intermittent Supervision/Assistance  ?Patient can return home with the following A little help with walking and/or transfers;A little help with bathing/dressing/bathroom;Assist for transportation;Help with stairs or ramp for entrance ?  ?Equipment Recommendations ? Rolling walker (2 wheels)  ?  ?Recommendations for Other Services   ? ? ?  ?Precautions / Restrictions Precautions ?Precautions: Fall ?Restrictions ?Weight Bearing Restrictions: Yes ?LLE Weight Bearing: Weight bearing as tolerated  ?  ? ?Mobility ? Bed Mobility ?Overal bed mobility: Needs Assistance ?Bed Mobility: Supine to Sit ?  ?  ?Supine to sit: Supervision ?  ?  ?General bed mobility comments: safe technique with no physical assist required ?  ? ?Transfers ?Overall transfer level: Needs assistance ?Equipment used: Rolling walker (2 wheels) ?Transfers: Sit to/from Stand ?Sit to Stand: Supervision ?  ?  ?  ?  ?  ?General transfer comment: cuing for technique ?  ? ?Ambulation/Gait ?Ambulation/Gait assistance: Supervision ?Gait Distance (Feet): 60 Feet ?Assistive device: Rolling walker (2 wheels) ?Gait Pattern/deviations: Step-through pattern ?  ?  ?  ?General Gait  Details: ambulated with reciprocal gait pattern to perform stair training. ? ? ?Stairs ?Stairs: Yes ?Stairs assistance: Min guard ?Stair Management: Two rails, Step to pattern, Forwards ?Number of Stairs: 8 ?General stair comments: up/down stairs x 2 with safe technique. No reports of pain ? ? ?Wheelchair Mobility ?  ? ?Modified Rankin (Stroke Patients Only) ?  ? ? ?  ?Balance Overall balance assessment: Needs assistance ?Sitting-balance support: Feet supported, Bilateral upper extremity supported ?Sitting balance-Leahy Scale: Good ?  ?  ?Standing balance support: Bilateral upper extremity supported, Reliant on assistive device for balance ?Standing balance-Leahy Scale: Fair ?  ?  ?  ?  ?  ?  ?  ?  ?  ?  ?  ?  ?  ? ?  ?Cognition Arousal/Alertness: Awake/alert ?Behavior During Therapy: [facility] for tasks assessed/performed ?Overall Cognitive Status: Within Functional Limits for tasks assessed ?  ?  ?  ?  ?  ?  ?  ?  ?  ?  ?  ?  ?  ?  ?  ?  ?  ?  ?  ? ?  ?Exercises Total Joint Exercises ?Goniometric ROM: L knee AAROM: 0-90 degrees ?Other Exercises ?Other Exercises: seated ther-ex performed on L LE including AP, quad sets, SLRs, SAQ, hip abd/add, and knee flexion. 12 reps with supervision. Written HEP given and reviewed ? ?  ?General Comments   ?  ?  ? ?Pertinent Vitals/Pain Pain Assessment ?Pain Assessment: 0-10 ?Pain Score: 2  ?Pain Location: L knee ?Pain Descriptors / Indicators: Operative site guarding ?Pain Intervention(s): Limited activity within patient's tolerance, Ice applied  ? ? ?Home Living Family/patient expects to be discharged to::  Private residence ?Living Arrangements: Spouse/significant other ?Available Help at Discharge: Family ?Type of Home: House ?Home Access: Stairs to enter ?Entrance Stairs-Rails: Can reach both ?Entrance Stairs-Number of Steps: 6 ?  ?Home Layout: One level ?Home Equipment: Conservation officer, nature (2 wheels);Cane - single point ?Additional Comments: wears O2 at night  ?  ?Prior Function    ?   ?  ?   ? ?PT Goals (current goals can now be found in the care plan section) Acute Rehab PT Goals ?Patient Stated Goal: to go home ?PT Goal Formulation: With patient ?Time For Goal Achievement: 12/06/21 ?Potential to Achieve Goals: Good ?Progress towards PT goals: Progressing toward goals ? ?  ?Frequency ? ? ? BID ? ? ? ?  ?PT Plan Current plan remains appropriate  ? ? ?Co-evaluation   ?  ?  ?  ?  ? ?  ?AM-PAC PT "6 Clicks" Mobility   ?Outcome Measure ? Help needed turning from your back to your side while in a flat bed without using bedrails?: A Little ?Help needed moving from lying on your back to sitting on the side of a flat bed without using bedrails?: A Little ?Help needed moving to and from a bed to a chair (including a wheelchair)?: A Little ?Help needed standing up from a chair using your arms (e.g., wheelchair or bedside chair)?: A Little ?Help needed to walk in hospital room?: A Little ?Help needed climbing 3-5 steps with a railing? : A Little ?6 Click Score: 18 ? ?  ?End of Session Equipment Utilized During Treatment: Gait belt ?Activity Tolerance: Patient tolerated treatment well ?Patient left: in bed;with bed alarm set ?Nurse Communication: Mobility status ?PT Visit Diagnosis: Muscle weakness (generalized) (M62.81);Pain ?Pain - Right/Left: Left ?Pain - part of body: Knee ?  ? ? ?Time: 6834-1962 ?PT Time Calculation (min) (ACUTE ONLY): 17 min ? ?Charges:  $Gait Training: 8-22 mins ?$Therapeutic Exercise: 8-22 mins          ?          ? ?Greggory Stallion, PT, DPT, GCS ?[phone] ? ? ? ?Janaisha Tolsma ?11/23/2021, 2:16 PM ? ?

## 2021-11-23 NOTE — TOC Progression Note (Signed)
Transition of Care (TOC) - Progression Note  ? ? ?Patient Details  ?Name: Jimmy Norton. ?MRN: [id number] ?Date of Birth: 09-19-1945 ? ?Transition of Care (TOC) CM/SW Contact  ?Ixel Boehning A Gladys Gutman, LCSW ?Phone Number: ?11/23/2021, 9:45 AM ? ?Clinical Narrative:    ?Walker ordered through [employer].  ? ? ?  ?  ? ?Expected Discharge Plan and Services ?  ?  ?  ?  ?  ?                ?  ?  ?  ?  ?  ?  ?  ?  ?  ?  ? ? ?Social Determinants of Health (SDOH) Interventions ?  ? ?Readmission Risk Interventions ?   ? View : No data to display.  ?  ?  ?  ? ? ?

## 2021-11-23 NOTE — Progress Notes (Signed)
Physical Therapy Treatment ?Patient Details ?Name: Jimmy Norton. ?MRN: [id number] ?DOB: [birth year] ?Today's Date: 11/23/2021 ? ? ?History of Present Illness Pt admitted for L TKR. History includes anxiety, HLD, and HTN. Pt is POD 0 at time of evaluation. History of R TKR. ? ?  ?PT Comments  ? ? Pt is making good progress towards goals with ability to ambulate around RN station safely. Cues given for gait sequencing and cadence. Written HEP given and reviewed. Pain well managed and pt demonstrates improvement in AAROM. Pt continues to be motivated to participate and eager to dc this date. Will plan to perform stair training this PM in anticipation of home dc. Will continue to progress.   ?Recommendations for follow up therapy are one component of a multi-disciplinary discharge planning process, led by the attending physician.  Recommendations may be updated based on patient status, additional functional criteria and insurance authorization. ? ?Follow Up Recommendations ? Home health PT ?  ?  ?Assistance Recommended at Discharge Intermittent Supervision/Assistance  ?Patient can return home with the following A little help with walking and/or transfers;A little help with bathing/dressing/bathroom;Assist for transportation;Help with stairs or ramp for entrance ?  ?Equipment Recommendations ? Rolling walker (2 wheels)  ?  ?Recommendations for Other Services   ? ? ?  ?Precautions / Restrictions Precautions ?Precautions: Fall ?Restrictions ?Weight Bearing Restrictions: Yes ?LLE Weight Bearing: Weight bearing as tolerated  ?  ? ?Mobility ? Bed Mobility ?  ?  ?  ?  ?  ?  ?  ?General bed mobility comments: not performed, received in recliner ?  ? ?Transfers ?Overall transfer level: Needs assistance ?Equipment used: Rolling walker (2 wheels) ?Transfers: Sit to/from Stand ?Sit to Stand: Min guard ?  ?  ?  ?  ?  ?General transfer comment: safe technique with upright posture noted ?  ? ?Ambulation/Gait ?Ambulation/Gait assistance:  Min guard ?Gait Distance (Feet): 250 Feet ?Assistive device: Rolling walker (2 wheels) ?Gait Pattern/deviations: Step-through pattern ?  ?  ?  ?General Gait Details: ambulated with reciprocal gait pattern and slow speed. RW used with cues for symmetrical step length. ? ? ?Stairs ?  ?  ?  ?  ?  ? ? ?Wheelchair Mobility ?  ? ?Modified Rankin (Stroke Patients Only) ?  ? ? ?  ?Balance Overall balance assessment: Needs assistance ?Sitting-balance support: Feet supported, Bilateral upper extremity supported ?Sitting balance-Leahy Scale: Good ?  ?  ?Standing balance support: Bilateral upper extremity supported ?Standing balance-Leahy Scale: Good ?  ?  ?  ?  ?  ?  ?  ?  ?  ?  ?  ?  ?  ? ?  ?Cognition Arousal/Alertness: Awake/alert ?Behavior During Therapy: [facility] for tasks assessed/performed ?Overall Cognitive Status: Within Functional Limits for tasks assessed ?  ?  ?  ?  ?  ?  ?  ?  ?  ?  ?  ?  ?  ?  ?  ?  ?  ?  ?  ? ?  ?Exercises Total Joint Exercises ?Goniometric ROM: L knee AAROM: 0-90 degrees ?Other Exercises ?Other Exercises: seated ther-ex performed on L LE including AP, quad sets, SLRs, SAQ, hip abd/add, and knee flexion. 12 reps with supervision. Written HEP given and reviewed ? ?  ?General Comments   ?  ?  ? ?Pertinent Vitals/Pain Pain Assessment ?Pain Assessment: 0-10 ?Pain Score: 5  ?Pain Location: L knee ?Pain Descriptors / Indicators: Operative site guarding ?Pain Intervention(s): Limited activity within patient's tolerance, Premedicated  before session, Repositioned, Ice applied  ? ? ?Home Living   ?  ?  ?  ?  ?  ?  ?  ?  ?  ?   ?  ?Prior Function    ?  ?  ?   ? ?PT Goals (current goals can now be found in the care plan section) Acute Rehab PT Goals ?Patient Stated Goal: to go home ?PT Goal Formulation: With patient ?Time For Goal Achievement: 12/06/21 ?Potential to Achieve Goals: Good ?Progress towards PT goals: Progressing toward goals ? ?  ?Frequency ? ? ? BID ? ? ? ?  ?PT Plan Current plan remains  appropriate  ? ? ?Co-evaluation   ?  ?  ?  ?  ? ?  ?AM-PAC PT "6 Clicks" Mobility   ?Outcome Measure ? Help needed turning from your back to your side while in a flat bed without using bedrails?: A Little ?Help needed moving from lying on your back to sitting on the side of a flat bed without using bedrails?: A Little ?Help needed moving to and from a bed to a chair (including a wheelchair)?: A Little ?Help needed standing up from a chair using your arms (e.g., wheelchair or bedside chair)?: A Little ?Help needed to walk in hospital room?: A Little ?Help needed climbing 3-5 steps with a railing? : A Little ?6 Click Score: 18 ? ?  ?End of Session Equipment Utilized During Treatment: Gait belt ?Activity Tolerance: Patient tolerated treatment well ?Patient left: in chair;with chair alarm set;with family/visitor present ?Nurse Communication: Mobility status ?PT Visit Diagnosis: Muscle weakness (generalized) (M62.81);Pain ?Pain - Right/Left: Left ?Pain - part of body: Knee ?  ? ? ?Time: 1610-9604 ?PT Time Calculation (min) (ACUTE ONLY): 25 min ? ?Charges:  $Gait Training: 8-22 mins ?$Therapeutic Exercise: 8-22 mins          ?          ? ?Greggory Stallion, PT, DPT, GCS ?[phone] ? ? ? ?Damichael Hofman ?11/23/2021, 10:24 AM ? ?

## 2021-11-23 NOTE — Evaluation (Signed)
Occupational Therapy Evaluation ?Patient Details ?Name: Jimmy Norton. ?MRN: [id number] ?DOB: [birth year] ?Today's Date: 11/23/2021 ? ? ?History of Present Illness Pt admitted for L TKR. History includes anxiety, HLD, and HTN. Pt is POD 0 at time of evaluation. History of R TKR.  ? ?Clinical Impression ?  ?Patient presenting with decreased Ind in self care, balance, functional mobility/transfers, endurance, and safety awareness. Patient report being independent at baseline without use of AD. He does endorse furniture walking. He lives with wife who is able to assist as needed at discharge.Patient currently functioning min guard - min A for functional mobility with use of RW. Patient will benefit from acute OT to increase overall independence in the areas of ADLs, functional mobility, and safety awareness in order to safely discharge home with family.  ?   ? ?Recommendations for follow up therapy are one component of a multi-disciplinary discharge planning process, led by the attending physician.  Recommendations may be updated based on patient status, additional functional criteria and insurance authorization.  ? ?Follow Up Recommendations ? No OT follow up  ?  ?Assistance Recommended at Discharge Intermittent Supervision/Assistance  ?Patient can return home with the following A little help with walking and/or transfers;A little help with bathing/dressing/bathroom;Help with stairs or ramp for entrance;Assistance with cooking/housework;Assist for transportation ? ?  ?Functional Status Assessment ? Patient has had a recent decline in their functional status and demonstrates the ability to make significant improvements in function in a reasonable and predictable amount of time.  ?Equipment Recommendations ? None recommended by OT  ?  ?   ?Precautions / Restrictions Precautions ?Precautions: Fall ?Restrictions ?Weight Bearing Restrictions: Yes ?LLE Weight Bearing: Weight bearing as tolerated  ? ?  ? ?Mobility Bed  Mobility ?  ?  ?  ?  ?  ?  ?  ?General bed mobility comments: not performed, received in recliner ?  ? ?Transfers ?Overall transfer level: Needs assistance ?Equipment used: Rolling walker (2 wheels) ?Transfers: Sit to/from Stand ?Sit to Stand: Min guard ?  ?  ?  ?  ?  ?General transfer comment: cuing for technique ?  ? ?  ?Balance Overall balance assessment: Needs assistance ?Sitting-balance support: Feet supported, Bilateral upper extremity supported ?Sitting balance-Leahy Scale: Good ?  ?  ?Standing balance support: Bilateral upper extremity supported, Reliant on assistive device for balance ?Standing balance-Leahy Scale: Fair ?  ?  ?  ?  ?  ?  ?  ?  ?  ?  ?  ?  ?   ? ?ADL either performed or assessed with clinical judgement  ? ?ADL Overall ADL's : Needs assistance/impaired ?  ?  ?  ?  ?  ?  ?  ?  ?  ?  ?  ?  ?  ?  ?  ?  ?  ?  ?  ?General ADL Comments: min guard- min A for functional mobility and self care tasks with cuing for technique and hand placement  ? ? ? ?Vision Patient Visual Report: No change from baseline ?   ?   ?   ?   ? ?Pertinent Vitals/Pain Pain Assessment ?Pain Assessment: 0-10 ?Pain Score: 5  ?Pain Location: L knee ?Pain Descriptors / Indicators: Operative site guarding ?Pain Intervention(s): Limited activity within patient's tolerance, Premedicated before session, Ice applied, Repositioned  ? ? ? ?Hand Dominance Right ?  ?Extremity/Trunk Assessment Upper Extremity Assessment ?Upper Extremity Assessment: Overall WFL for tasks assessed ?  ?Lower Extremity Assessment ?Lower Extremity Assessment:  Overall [facility] for tasks assessed;Generalized weakness ?  ?  ?  ?Communication Communication ?Communication: No difficulties ?  ?Cognition Arousal/Alertness: Awake/alert ?Behavior During Therapy: [facility] for tasks assessed/performed ?Overall Cognitive Status: Within Functional Limits for tasks assessed ?  ?  ?  ?  ?  ?  ?  ?  ?  ?  ?  ?  ?  ?  ?  ?  ?  ?  ?  ?   ?   ?   ? ? ?Home Living Family/patient expects to  be discharged to:: Private residence ?Living Arrangements: Spouse/significant other ?Available Help at Discharge: Family ?Type of Home: House ?Home Access: Stairs to enter ?Entrance Stairs-Number of Steps: 6 ?Entrance Stairs-Rails: Can reach both ?Home Layout: One level ?  ?  ?Bathroom Shower/Tub: Tub/shower unit ?  ?Bathroom Toilet: Handicapped height ?Bathroom Accessibility: Yes ?  ?Home Equipment: Conservation officer, nature (2 wheels);Cane - single point ?  ?Additional Comments: wears O2 at night ?  ? ?  ?Prior Functioning/Environment Prior Level of Function : Independent/Modified Independent ?  ?  ?  ?  ?  ?  ?Mobility Comments: furniture cruises. No recent falls ?ADLs Comments: indep ?  ? ?  ?  ?OT Problem List: Decreased strength;Pain;Decreased range of motion;Decreased activity tolerance;Decreased safety awareness;Impaired balance (sitting and/or standing);Decreased knowledge of use of DME or AE;Decreased knowledge of precautions ?  ?   ?OT Treatment/Interventions: Self-care/ADL training;Therapeutic exercise;Therapeutic activities;Energy conservation;DME and/or AE instruction;Patient/family education;Manual therapy;Balance training  ?  ?OT Goals(Current goals can be found in the care plan section) Acute Rehab OT Goals ?Patient Stated Goal: to go home ?OT Goal Formulation: With patient/family ?Time For Goal Achievement: 12/07/21 ?Potential to Achieve Goals: Good ?ADL Goals ?Pt Will Perform Grooming: with modified independence;standing ?Pt Will Perform Lower Body Dressing: with modified independence;sit to/from stand ?Pt Will Transfer to Toilet: with modified independence;ambulating ?Pt Will Perform Toileting - Clothing Manipulation and hygiene: with modified independence;sit to/from stand  ?OT Frequency: Min 2X/week ?  ? ?   ?AM-PAC OT "6 Clicks" Daily Activity     ?Outcome Measure Help from another person eating meals?: None ?Help from another person taking care of personal grooming?: A Little ?Help from another person  toileting, which includes using toliet, bedpan, or urinal?: A Little ?Help from another person bathing (including washing, rinsing, drying)?: A Little ?Help from another person to put on and taking off regular upper body clothing?: None ?Help from another person to put on and taking off regular lower body clothing?: A Little ?6 Click Score: 20 ?  ?End of Session Equipment Utilized During Treatment: Rolling walker (2 wheels) ?Nurse Communication: Mobility status;Other (comment) (pt call bell not working in room) ? ?Activity Tolerance: Patient tolerated treatment well ?Patient left: in chair;with call bell/phone within reach;with bed alarm set ? ?OT Visit Diagnosis: Unsteadiness on feet (R26.81);Repeated falls (R29.6);Muscle weakness (generalized) (M62.81)  ?              ?Time: 4315-4008 ?OT Time Calculation (min): 21 min ?Charges:  OT General Charges ?$OT Visit: 1 Visit ?OT Evaluation ?$OT Eval Moderate Complexity: 1 Mod ?OT Treatments ?$Self Care/Home Management : 8-22 mins ? ?Darleen Crocker, MS, OTR/L , CBIS ?ascom [phone]  ?11/23/21, 11:29 AM  ?

## 2021-11-23 NOTE — Progress Notes (Signed)
Post-op dressing removed. , Hemovac removed., and Mini compression dressing applied.    

## 2021-11-24 ENCOUNTER — Ambulatory Visit: Payer: Medicare HMO

## 2021-11-25 ENCOUNTER — Ambulatory Visit: Payer: Medicare HMO | Admitting: Nurse Practitioner

## 2021-11-25 DIAGNOSIS — Z87442 Personal history of urinary calculi: Secondary | ICD-10-CM | POA: Diagnosis not present

## 2021-11-25 DIAGNOSIS — J301 Allergic rhinitis due to pollen: Secondary | ICD-10-CM | POA: Diagnosis not present

## 2021-11-25 DIAGNOSIS — K8020 Calculus of gallbladder without cholecystitis without obstruction: Secondary | ICD-10-CM | POA: Diagnosis not present

## 2021-11-25 DIAGNOSIS — E291 Testicular hypofunction: Secondary | ICD-10-CM | POA: Diagnosis not present

## 2021-11-25 DIAGNOSIS — G4730 Sleep apnea, unspecified: Secondary | ICD-10-CM | POA: Diagnosis not present

## 2021-11-25 DIAGNOSIS — Z4789 Encounter for other orthopedic aftercare: Secondary | ICD-10-CM | POA: Diagnosis not present

## 2021-11-25 DIAGNOSIS — Z471 Aftercare following joint replacement surgery: Secondary | ICD-10-CM | POA: Diagnosis not present

## 2021-11-25 DIAGNOSIS — R69 Illness, unspecified: Secondary | ICD-10-CM | POA: Diagnosis not present

## 2021-11-25 DIAGNOSIS — E782 Mixed hyperlipidemia: Secondary | ICD-10-CM | POA: Diagnosis not present

## 2021-11-25 DIAGNOSIS — N3281 Overactive bladder: Secondary | ICD-10-CM | POA: Diagnosis not present

## 2021-11-25 DIAGNOSIS — M19021 Primary osteoarthritis, right elbow: Secondary | ICD-10-CM | POA: Diagnosis not present

## 2021-11-25 DIAGNOSIS — Z96653 Presence of artificial knee joint, bilateral: Secondary | ICD-10-CM | POA: Diagnosis not present

## 2021-11-25 DIAGNOSIS — Z87891 Personal history of nicotine dependence: Secondary | ICD-10-CM | POA: Diagnosis not present

## 2021-11-25 DIAGNOSIS — Z9981 Dependence on supplemental oxygen: Secondary | ICD-10-CM | POA: Diagnosis not present

## 2021-11-25 DIAGNOSIS — I10 Essential (primary) hypertension: Secondary | ICD-10-CM | POA: Diagnosis not present

## 2021-11-25 DIAGNOSIS — Z7901 Long term (current) use of anticoagulants: Secondary | ICD-10-CM | POA: Diagnosis not present

## 2021-11-27 DIAGNOSIS — I10 Essential (primary) hypertension: Secondary | ICD-10-CM | POA: Diagnosis not present

## 2021-11-27 DIAGNOSIS — R69 Illness, unspecified: Secondary | ICD-10-CM | POA: Diagnosis not present

## 2021-11-27 DIAGNOSIS — K8020 Calculus of gallbladder without cholecystitis without obstruction: Secondary | ICD-10-CM | POA: Diagnosis not present

## 2021-11-27 DIAGNOSIS — Z87891 Personal history of nicotine dependence: Secondary | ICD-10-CM | POA: Diagnosis not present

## 2021-11-27 DIAGNOSIS — N3281 Overactive bladder: Secondary | ICD-10-CM | POA: Diagnosis not present

## 2021-11-27 DIAGNOSIS — E291 Testicular hypofunction: Secondary | ICD-10-CM | POA: Diagnosis not present

## 2021-11-27 DIAGNOSIS — Z96653 Presence of artificial knee joint, bilateral: Secondary | ICD-10-CM | POA: Diagnosis not present

## 2021-11-27 DIAGNOSIS — E782 Mixed hyperlipidemia: Secondary | ICD-10-CM | POA: Diagnosis not present

## 2021-11-27 DIAGNOSIS — Z471 Aftercare following joint replacement surgery: Secondary | ICD-10-CM | POA: Diagnosis not present

## 2021-11-27 DIAGNOSIS — J301 Allergic rhinitis due to pollen: Secondary | ICD-10-CM | POA: Diagnosis not present

## 2021-11-27 DIAGNOSIS — Z7901 Long term (current) use of anticoagulants: Secondary | ICD-10-CM | POA: Diagnosis not present

## 2021-11-27 DIAGNOSIS — Z9981 Dependence on supplemental oxygen: Secondary | ICD-10-CM | POA: Diagnosis not present

## 2021-11-27 DIAGNOSIS — M19021 Primary osteoarthritis, right elbow: Secondary | ICD-10-CM | POA: Diagnosis not present

## 2021-11-27 DIAGNOSIS — G4730 Sleep apnea, unspecified: Secondary | ICD-10-CM | POA: Diagnosis not present

## 2021-11-27 DIAGNOSIS — Z87442 Personal history of urinary calculi: Secondary | ICD-10-CM | POA: Diagnosis not present

## 2021-11-29 ENCOUNTER — Other Ambulatory Visit: Payer: Medicare HMO

## 2021-11-29 DIAGNOSIS — G4730 Sleep apnea, unspecified: Secondary | ICD-10-CM | POA: Diagnosis not present

## 2021-11-29 DIAGNOSIS — Z96653 Presence of artificial knee joint, bilateral: Secondary | ICD-10-CM | POA: Diagnosis not present

## 2021-11-29 DIAGNOSIS — K8020 Calculus of gallbladder without cholecystitis without obstruction: Secondary | ICD-10-CM | POA: Diagnosis not present

## 2021-11-29 DIAGNOSIS — Z87442 Personal history of urinary calculi: Secondary | ICD-10-CM | POA: Diagnosis not present

## 2021-11-29 DIAGNOSIS — R69 Illness, unspecified: Secondary | ICD-10-CM | POA: Diagnosis not present

## 2021-11-29 DIAGNOSIS — Z87891 Personal history of nicotine dependence: Secondary | ICD-10-CM | POA: Diagnosis not present

## 2021-11-29 DIAGNOSIS — Z7901 Long term (current) use of anticoagulants: Secondary | ICD-10-CM | POA: Diagnosis not present

## 2021-11-29 DIAGNOSIS — Z471 Aftercare following joint replacement surgery: Secondary | ICD-10-CM | POA: Diagnosis not present

## 2021-11-29 DIAGNOSIS — I10 Essential (primary) hypertension: Secondary | ICD-10-CM | POA: Diagnosis not present

## 2021-11-29 DIAGNOSIS — N3281 Overactive bladder: Secondary | ICD-10-CM | POA: Diagnosis not present

## 2021-11-29 DIAGNOSIS — E291 Testicular hypofunction: Secondary | ICD-10-CM | POA: Diagnosis not present

## 2021-11-29 DIAGNOSIS — M19021 Primary osteoarthritis, right elbow: Secondary | ICD-10-CM | POA: Diagnosis not present

## 2021-11-29 DIAGNOSIS — J301 Allergic rhinitis due to pollen: Secondary | ICD-10-CM | POA: Diagnosis not present

## 2021-11-29 DIAGNOSIS — E782 Mixed hyperlipidemia: Secondary | ICD-10-CM | POA: Diagnosis not present

## 2021-11-29 DIAGNOSIS — Z9981 Dependence on supplemental oxygen: Secondary | ICD-10-CM | POA: Diagnosis not present

## 2021-12-01 DIAGNOSIS — E291 Testicular hypofunction: Secondary | ICD-10-CM | POA: Diagnosis not present

## 2021-12-01 DIAGNOSIS — Z7901 Long term (current) use of anticoagulants: Secondary | ICD-10-CM | POA: Diagnosis not present

## 2021-12-01 DIAGNOSIS — Z471 Aftercare following joint replacement surgery: Secondary | ICD-10-CM | POA: Diagnosis not present

## 2021-12-01 DIAGNOSIS — G4730 Sleep apnea, unspecified: Secondary | ICD-10-CM | POA: Diagnosis not present

## 2021-12-01 DIAGNOSIS — I10 Essential (primary) hypertension: Secondary | ICD-10-CM | POA: Diagnosis not present

## 2021-12-01 DIAGNOSIS — Z96653 Presence of artificial knee joint, bilateral: Secondary | ICD-10-CM | POA: Diagnosis not present

## 2021-12-01 DIAGNOSIS — E782 Mixed hyperlipidemia: Secondary | ICD-10-CM | POA: Diagnosis not present

## 2021-12-01 DIAGNOSIS — K8020 Calculus of gallbladder without cholecystitis without obstruction: Secondary | ICD-10-CM | POA: Diagnosis not present

## 2021-12-01 DIAGNOSIS — N3281 Overactive bladder: Secondary | ICD-10-CM | POA: Diagnosis not present

## 2021-12-01 DIAGNOSIS — Z9981 Dependence on supplemental oxygen: Secondary | ICD-10-CM | POA: Diagnosis not present

## 2021-12-01 DIAGNOSIS — J301 Allergic rhinitis due to pollen: Secondary | ICD-10-CM | POA: Diagnosis not present

## 2021-12-01 DIAGNOSIS — M19021 Primary osteoarthritis, right elbow: Secondary | ICD-10-CM | POA: Diagnosis not present

## 2021-12-01 DIAGNOSIS — Z87891 Personal history of nicotine dependence: Secondary | ICD-10-CM | POA: Diagnosis not present

## 2021-12-01 DIAGNOSIS — Z87442 Personal history of urinary calculi: Secondary | ICD-10-CM | POA: Diagnosis not present

## 2021-12-01 DIAGNOSIS — R69 Illness, unspecified: Secondary | ICD-10-CM | POA: Diagnosis not present

## 2021-12-02 NOTE — Discharge Summary (Signed)
Physician Discharge Summary  ?Patient ID: ?Jimmy Norton. ?MRN: [id number] ?DOB/AGE: 76-09-1945 76 y.o. ? ?Admit date: 11/22/2021 ?Discharge date: 11/23/21 ? ?Admission Diagnoses:  ?Total knee replacement status [Z96.659] ? ?Surgeries:Procedure(s): ? Left total knee arthroplasty using computer-assisted navigation ?  ?SURGEON:  Marciano Sequin. M.D. ?  ?ASSISTANT: Cassell Smiles, PA-C (present and scrubbed throughout the case, critical for assistance with exposure, retraction, instrumentation, and closure) ?  ?ANESTHESIA: general ?  ?ESTIMATED BLOOD LOSS: 50 mL ?  ?FLUIDS REPLACED: 800 mL of crystalloid ?  ?TOURNIQUET TIME: 93 minutes ?  ?DRAINS: 2 medium Hemovac drains ?  ?SOFT TISSUE RELEASES: Anterior cruciate ligament, posterior cruciate ligament, deep and superficial medial collateral ligament, patellofemoral ligament ?  ?IMPLANTS UTILIZED: DePuy Attune size 8 posterior stabilized femoral component (cemented), size 8 rotating platform tibial component (cemented), 41 mm medialized dome patella (cemented), and a 5 mm stabilized rotating platform polyethylene insert. ?  ? ?Discharge Diagnoses: ?Patient Active Problem List  ? Diagnosis Date Noted  ? Unspecified astigmatism, bilateral 11/22/2021  ? Balanitis 11/22/2021  ? 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 Numbness and tingling in right hand 09/25/2021  ? Hardening of the aorta (main artery of the heart) ([place]) 08/29/2021  ? Exposure to potentially hazardous substance 08/29/2021  ? 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 Age-related nuclear cataract, bilateral 08/29/2021  ? Noncompliance with medication regimen 12/15/2020  ? Syncope 04/15/2020  ? Abrasion of left forearm 04/15/2020  ? Abrasion of right lower leg 04/15/2020  ? Laceration of left forearm without complication 48/54/6270  ? Encounter for general adult medical examination with abnormal findings 08/27/2019  ? Left knee pain 08/27/2019  ? Cervical stenosis of spinal canal 11/06/2018  ?  Screening for prostate cancer 07/30/2018  ? Dysuria 07/30/2018  ? Arthritis of carpometacarpal [facility]) joint of right thumb 07/02/2018  ? Primary generalized (osteo)arthritis 06/10/2018  ? Cervical disc disease with myelopathy 06/10/2018  ? Anxiety 06/08/2018  ? Depression 06/08/2018  ? BPH (benign prostatic hyperplasia) 06/08/2018  ? Neuropathy 06/08/2018  ? Morbid obesity with body mass index (BMI) of 40.0 to 44.9 in adult [facility]) 11/30/2017  ? Tick bite 11/27/2017  ? Plantar fasciitis 11/27/2017  ? Primary osteoarthritis of right elbow 11/27/2017  ? OSA on CPAP 08/24/2017  ? Mixed hyperlipidemia 07/28/2017  ? Allergic rhinitis due to pollen 07/28/2017  ? Hypoxemia 07/28/2017  ? Calculus of kidney 07/28/2017  ? Chest pain, unspecified 07/28/2017  ? Major depressive disorder, recurrent, moderate ([place]) 07/28/2017  ? 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 Pain in left ankle and joints of left foot 07/28/2017  ? Essential (primary) hypertension 07/28/2017  ? Elevated hemoglobin A1c 12/02/2015  ? Urge incontinence 11/25/2015  ? Urinary frequency 10/21/2015  ? OAB (overactive bladder) 09/23/2015  ? Frequency 09/18/2015  ? Gallstones 03/05/2013  ? 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 Diverticulosis of colon 07/26/1991  ? Dyspepsia and other specified disorders of function of stomach 07/26/1983  ? Hearing loss 07/25/1968  ? Migraine 07/26/1963  ? ? ?Past Medical History:  ?Diagnosis Date  ? Anxiety and depression   ? Arthritis   ? Complication of anesthesia   ? woke up during surgery  ? Frequency   ? Gallstones 03/05/2013  ? Headache   ? Hypertension   ? Kidney problem 11/2011  ? Mixed hyperlipidemia   ? OAB (overactive bladder)   ? Obstructive sleep apnea (adult) (pediatric)   ? Pneumonia   ? ?  ?Transfusion:  ?  ?Consultants (  if any):  ? ?Discharged Condition: Improved ? ?Hospital Course: Jimmy A Salmaan Patchin. is an 76 y.o. male who was admitted 11/22/2021 with a diagnosis of left knee osteoarthritis and went to the  operating room on 11/22/2021 and underwent left total knee arthoplasty. The patient received perioperative antibiotics for prophylaxis (see below). The patient tolerated the procedure well and was transported to PACU in stable condition. After meeting PACU criteria, the patient was subsequently transferred to the Orthopaedics/Rehabilitation unit.  ? ?The patient received DVT prophylaxis in the form of early mobilization, Lovenox, TED hose, and SCDs . A sacral pad had been placed and heels were elevated off of the bed with rolled towels in order to protect skin integrity. Foley catheter was discontinued on postoperative day #0. Wound drains were discontinued on postoperative day #1. The surgical incision was healing well without signs of infection. ? ?Physical therapy was initiated postoperatively for transfers, gait training, and strengthening. Occupational therapy was initiated for activities of daily living and evaluation for assisted devices. Rehabilitation goals were reviewed in detail with the patient. The patient made steady progress with physical therapy and physical therapy recommended discharge to Home.  ? ?The patient achieved the preliminary goals of this hospitalization and was felt to be medically and orthopaedically appropriate for discharge. ? ?He was given perioperative antibiotics:  ?Anti-infectives (From admission, onward)  ? ? Start     Dose/Rate Route Frequency Ordered Stop  ? 11/22/21 1400  ceFAZolin (ANCEF) IVPB 2g/100 mL premix       ? 2 g ?200 mL/hr over 30 Minutes Intravenous Every 6 hours 11/22/21 1249 11/22/21 2111  ? 11/22/21 0623  ceFAZolin (ANCEF) 2-4 GM/100ML-% IVPB       ?Note to Pharmacy: Jordan Hawks H: cabinet override  ?    11/22/21 6962 11/22/21 0759  ? 11/22/21 0600  ceFAZolin (ANCEF) IVPB 2g/100 mL premix       ? 2 g ?200 mL/hr over 30 Minutes Intravenous On call to O.R. 11/21/21 2221 11/22/21 0805  ? ?  ?. ? ?Recent vital signs:  ?Vitals:  ? 11/23/21 0759 11/23/21 1237   ?BP: 124/67 94/68  ?Pulse: 76 67  ?Resp: 20   ?Temp: 97.9 ?F (36.6 ?C) 97.8 ?F (36.6 ?C)  ?SpO2: 93% 98%  ? ? ?Recent laboratory studies:  ?No results for input(s): WBC, HGB, HCT, PLT, K, CL, CO2, BUN, CREATININE, GLUCOSE, CALCIUM, LABPT, INR in the last 72 hours. ? ?Diagnostic Studies: DG Knee Left Port ? ?Result Date: 11/22/2021 ?CLINICAL DATA:  Post LEFT total knee arthroplasty EXAM: PORTABLE LEFT KNEE - 1-2 VIEW COMPARISON:  Portable exam 1155 hours without priors for comparison FINDINGS: LEFT knee prosthesis identified. Mild osseous demineralization. No acute fracture, dislocation, or bone destruction. Surgical drains and skin clips anteriorly. IMPRESSION: LEFT knee prosthesis without acute complication. Electronically Signed   By: Lavonia Dana M.D.   On: 11/22/2021 12:02   ? ?Discharge Medications:   ?Allergies as of 11/23/2021   ? ?   Reactions  ? Bee Venom Anaphylaxis  ? Penicillins Rash  ? IgE = 82 (WNL) on 11/09/2021 ?Tolerated Cephalosporin Date: 11/22/21.  ? ?  ? ?  ?Medication List  ?  ? ?STOP taking these medications   ? ?meloxicam 15 MG tablet ?Commonly known as: MOBIC ?  ? ?  ? ?TAKE these medications   ? ?B-12 2500 MCG Tabs ?Take 2,500 mcg by mouth daily. ?  ?B-6 100 MG Tabs ?Take 100 mg by mouth daily. ?  ?celecoxib  200 MG capsule ?Commonly known as: CELEBREX ?Take 1 capsule (200 mg total) by mouth 2 (two) times daily. ?  ?cholecalciferol 10 MCG (400 UNIT) Tabs tablet ?Commonly known as: VITAMIN D3 ?Take 400 Units by mouth daily. ?  ?enoxaparin 40 MG/0.4ML injection ?Commonly known as: LOVENOX ?Inject 0.4 mLs (40 mg total) into the skin daily for 14 days. ?  ?FLUoxetine 20 MG capsule ?Commonly known as: PROZAC ?Take 1 capsule (20 mg total) by mouth daily. ?  ?gabapentin 100 MG capsule ?Commonly known as: NEURONTIN ?Take 100 mg by mouth once. ?  ?GLUCOSAMINE-CHONDROITIN PO ?Take 1 tablet by mouth 2 (two) times daily. ?  ?losartan 25 MG tablet ?Commonly known as: COZAAR ?Take 1 tablet (25 mg total) by mouth  daily. ?  ?Misc. Devices Misc ?cpap ?  ?multivitamin with minerals tablet ?Take 1 tablet by mouth daily. Reported on 09/11/2015 ?  ?oxyCODONE 5 MG immediate release tablet ?Commonly known as: Oxy IR/ROXICODONE ?Take

## 2021-12-03 DIAGNOSIS — K8020 Calculus of gallbladder without cholecystitis without obstruction: Secondary | ICD-10-CM | POA: Diagnosis not present

## 2021-12-03 DIAGNOSIS — I10 Essential (primary) hypertension: Secondary | ICD-10-CM | POA: Diagnosis not present

## 2021-12-03 DIAGNOSIS — R69 Illness, unspecified: Secondary | ICD-10-CM | POA: Diagnosis not present

## 2021-12-03 DIAGNOSIS — G4730 Sleep apnea, unspecified: Secondary | ICD-10-CM | POA: Diagnosis not present

## 2021-12-03 DIAGNOSIS — N3281 Overactive bladder: Secondary | ICD-10-CM | POA: Diagnosis not present

## 2021-12-03 DIAGNOSIS — Z87891 Personal history of nicotine dependence: Secondary | ICD-10-CM | POA: Diagnosis not present

## 2021-12-03 DIAGNOSIS — Z471 Aftercare following joint replacement surgery: Secondary | ICD-10-CM | POA: Diagnosis not present

## 2021-12-03 DIAGNOSIS — Z9981 Dependence on supplemental oxygen: Secondary | ICD-10-CM | POA: Diagnosis not present

## 2021-12-03 DIAGNOSIS — J301 Allergic rhinitis due to pollen: Secondary | ICD-10-CM | POA: Diagnosis not present

## 2021-12-03 DIAGNOSIS — Z87442 Personal history of urinary calculi: Secondary | ICD-10-CM | POA: Diagnosis not present

## 2021-12-03 DIAGNOSIS — Z7901 Long term (current) use of anticoagulants: Secondary | ICD-10-CM | POA: Diagnosis not present

## 2021-12-03 DIAGNOSIS — E782 Mixed hyperlipidemia: Secondary | ICD-10-CM | POA: Diagnosis not present

## 2021-12-03 DIAGNOSIS — E291 Testicular hypofunction: Secondary | ICD-10-CM | POA: Diagnosis not present

## 2021-12-03 DIAGNOSIS — Z96653 Presence of artificial knee joint, bilateral: Secondary | ICD-10-CM | POA: Diagnosis not present

## 2021-12-03 DIAGNOSIS — M19021 Primary osteoarthritis, right elbow: Secondary | ICD-10-CM | POA: Diagnosis not present

## 2021-12-06 DIAGNOSIS — Z96653 Presence of artificial knee joint, bilateral: Secondary | ICD-10-CM | POA: Diagnosis not present

## 2021-12-06 DIAGNOSIS — M19021 Primary osteoarthritis, right elbow: Secondary | ICD-10-CM | POA: Diagnosis not present

## 2021-12-06 DIAGNOSIS — G4733 Obstructive sleep apnea (adult) (pediatric): Secondary | ICD-10-CM | POA: Diagnosis not present

## 2021-12-06 DIAGNOSIS — Z87891 Personal history of nicotine dependence: Secondary | ICD-10-CM | POA: Diagnosis not present

## 2021-12-06 DIAGNOSIS — E291 Testicular hypofunction: Secondary | ICD-10-CM | POA: Diagnosis not present

## 2021-12-06 DIAGNOSIS — J301 Allergic rhinitis due to pollen: Secondary | ICD-10-CM | POA: Diagnosis not present

## 2021-12-06 DIAGNOSIS — R69 Illness, unspecified: Secondary | ICD-10-CM | POA: Diagnosis not present

## 2021-12-06 DIAGNOSIS — Z471 Aftercare following joint replacement surgery: Secondary | ICD-10-CM | POA: Diagnosis not present

## 2021-12-06 DIAGNOSIS — Z9981 Dependence on supplemental oxygen: Secondary | ICD-10-CM | POA: Diagnosis not present

## 2021-12-06 DIAGNOSIS — J9601 Acute respiratory failure with hypoxia: Secondary | ICD-10-CM | POA: Diagnosis not present

## 2021-12-06 DIAGNOSIS — G4730 Sleep apnea, unspecified: Secondary | ICD-10-CM | POA: Diagnosis not present

## 2021-12-06 DIAGNOSIS — I10 Essential (primary) hypertension: Secondary | ICD-10-CM | POA: Diagnosis not present

## 2021-12-06 DIAGNOSIS — K8020 Calculus of gallbladder without cholecystitis without obstruction: Secondary | ICD-10-CM | POA: Diagnosis not present

## 2021-12-06 DIAGNOSIS — E782 Mixed hyperlipidemia: Secondary | ICD-10-CM | POA: Diagnosis not present

## 2021-12-06 DIAGNOSIS — Z87442 Personal history of urinary calculi: Secondary | ICD-10-CM | POA: Diagnosis not present

## 2021-12-06 DIAGNOSIS — Z7901 Long term (current) use of anticoagulants: Secondary | ICD-10-CM | POA: Diagnosis not present

## 2021-12-06 DIAGNOSIS — N3281 Overactive bladder: Secondary | ICD-10-CM | POA: Diagnosis not present

## 2021-12-07 DIAGNOSIS — M25562 Pain in left knee: Secondary | ICD-10-CM | POA: Diagnosis not present

## 2021-12-07 DIAGNOSIS — M25662 Stiffness of left knee, not elsewhere classified: Secondary | ICD-10-CM | POA: Diagnosis not present

## 2021-12-07 DIAGNOSIS — Z96652 Presence of left artificial knee joint: Secondary | ICD-10-CM | POA: Diagnosis not present

## 2021-12-07 DIAGNOSIS — R531 Weakness: Secondary | ICD-10-CM | POA: Diagnosis not present

## 2021-12-08 DIAGNOSIS — M25562 Pain in left knee: Secondary | ICD-10-CM | POA: Diagnosis not present

## 2021-12-08 DIAGNOSIS — Z96652 Presence of left artificial knee joint: Secondary | ICD-10-CM | POA: Diagnosis not present

## 2021-12-08 DIAGNOSIS — R531 Weakness: Secondary | ICD-10-CM | POA: Diagnosis not present

## 2021-12-08 DIAGNOSIS — M25662 Stiffness of left knee, not elsewhere classified: Secondary | ICD-10-CM | POA: Diagnosis not present

## 2021-12-13 DIAGNOSIS — M25662 Stiffness of left knee, not elsewhere classified: Secondary | ICD-10-CM | POA: Diagnosis not present

## 2021-12-13 DIAGNOSIS — R531 Weakness: Secondary | ICD-10-CM | POA: Diagnosis not present

## 2021-12-13 DIAGNOSIS — M25562 Pain in left knee: Secondary | ICD-10-CM | POA: Diagnosis not present

## 2021-12-13 DIAGNOSIS — Z96652 Presence of left artificial knee joint: Secondary | ICD-10-CM | POA: Diagnosis not present

## 2021-12-16 DIAGNOSIS — M25562 Pain in left knee: Secondary | ICD-10-CM | POA: Diagnosis not present

## 2021-12-16 DIAGNOSIS — M25662 Stiffness of left knee, not elsewhere classified: Secondary | ICD-10-CM | POA: Diagnosis not present

## 2021-12-16 DIAGNOSIS — Z96652 Presence of left artificial knee joint: Secondary | ICD-10-CM | POA: Diagnosis not present

## 2021-12-16 DIAGNOSIS — R531 Weakness: Secondary | ICD-10-CM | POA: Diagnosis not present

## 2021-12-21 ENCOUNTER — Ambulatory Visit (INDEPENDENT_AMBULATORY_CARE_PROVIDER_SITE_OTHER): Payer: Medicare HMO | Admitting: Nurse Practitioner

## 2021-12-21 ENCOUNTER — Encounter: Payer: Self-pay | Admitting: Nurse Practitioner

## 2021-12-21 VITALS — BP 127/71 | HR 67 | Temp 98.3°F | Resp 16 | Ht 69.0 in | Wt 298.0 lb

## 2021-12-21 DIAGNOSIS — R531 Weakness: Secondary | ICD-10-CM | POA: Diagnosis not present

## 2021-12-21 DIAGNOSIS — Z96652 Presence of left artificial knee joint: Secondary | ICD-10-CM

## 2021-12-21 DIAGNOSIS — I77810 Thoracic aortic ectasia: Secondary | ICD-10-CM | POA: Diagnosis not present

## 2021-12-21 DIAGNOSIS — M25662 Stiffness of left knee, not elsewhere classified: Secondary | ICD-10-CM | POA: Diagnosis not present

## 2021-12-21 DIAGNOSIS — M25562 Pain in left knee: Secondary | ICD-10-CM | POA: Diagnosis not present

## 2021-12-21 DIAGNOSIS — I700 Atherosclerosis of aorta: Secondary | ICD-10-CM

## 2021-12-21 DIAGNOSIS — I10 Essential (primary) hypertension: Secondary | ICD-10-CM

## 2021-12-21 DIAGNOSIS — M150 Primary generalized (osteo)arthritis: Secondary | ICD-10-CM

## 2021-12-21 MED ORDER — LOSARTAN POTASSIUM 25 MG PO TABS: 25.0000 mg | ORAL_TABLET | Freq: Every day | ORAL | 1 refills | Status: DC

## 2021-12-21 NOTE — Progress Notes (Signed)
[facility] [place], [place] [address]  Internal MEDICINE  Office Visit Note  Patient Name: Jimmy Norton  [id number]  [id number]  Date of Service: 12/21/2021  Chief Complaint  Patient presents with   Follow-up   Depression   Anxiety   Hyperlipidemia   Hypertension   Results    HPI Mendell presents for a follow up visit for hypertension, hyperlipidemia, depression and anxiety and medication refills. He has had knee surgery recently. He is still going to physical therapy. He reports that he is doing well although he did have an infection after surgery in his anterior lower leg which he reports is what happened with the other leg when he had knee surgery. The infection is resolved and the surgical incision is healed. He reports that it is still tender but it is very mild.  He is in good spirits. He is taking his medication every day as prescribed and might forget his medication every once in a while. His wife is also working less so she has been home more lately.  His blood pressure is well controled with current medications and his other vital signs are within normal limits. He has lost a few pounds in the past couple of months.  --echocardiogram results show LVEF approx 58%, normal global systolic function of left ventricle. Impaired relaxation with diastolic filling. Borderline dilated ascending aorta, borderline dilated aortic root. Trace to Mild TR, and moderate mitral annular calcifications.    Current Medication: Outpatient Encounter Medications as of 12/21/2021  Medication Sig   celecoxib (CELEBREX) 200 MG capsule Take 1 capsule (200 mg total) by mouth 2 (two) times daily.   cholecalciferol (VITAMIN D) 400 UNITS TABS tablet Take 400 Units by mouth daily.   Cyanocobalamin (B-12) 2500 MCG TABS Take 2,500 mcg by mouth daily.   FLUoxetine (PROZAC) 20 MG capsule Take 1 capsule (20 mg total) by mouth daily.   gabapentin (NEURONTIN) 100 MG capsule Take 100  mg by mouth once.   GLUCOSAMINE-CHONDROITIN PO Take 1 tablet by mouth 2 (two) times daily.   Misc. Devices MISC cpap   Multiple Vitamins-Minerals (MULTIVITAMIN WITH MINERALS) tablet Take 1 tablet by mouth daily. Reported on 09/11/2015   Probiotic Product (PROBIOTIC-10 PO) Take 1 capsule by mouth daily.   Pyridoxine HCl (B-6) 100 MG TABS Take 100 mg by mouth daily.   rosuvastatin (CRESTOR) 5 MG tablet Take 1 tablet (5 mg total) by mouth daily.   terazosin (HYTRIN) 10 MG capsule Take 1 capsule (10 mg total) by mouth at bedtime.   [DISCONTINUED] losartan (COZAAR) 25 MG tablet Take 1 tablet (25 mg total) by mouth daily.   [DISCONTINUED] oxyCODONE (OXY IR/ROXICODONE) 5 MG immediate release tablet Take 1 tablet (5 mg total) by mouth every 4 (four) hours as needed for severe pain. (Patient not taking: Reported on 01/06/2022)   [DISCONTINUED] traMADol (ULTRAM) 50 MG tablet Take 1 tablet (50 mg total) by mouth every 4 (four) hours as needed for moderate pain. (Patient not taking: Reported on 01/06/2022)   enoxaparin (LOVENOX) 40 MG/0.4ML injection Inject 0.4 mLs (40 mg total) into the skin daily for 14 days.   losartan (COZAAR) 25 MG tablet Take 1 tablet (25 mg total) by mouth daily.   No facility-administered encounter medications on file as of 12/21/2021.    Surgical History: Past Surgical History:  Procedure Laterality Date   COLONOSCOPY WITH PROPOFOL N/A 05/11/2021   Procedure: COLONOSCOPY WITH PROPOFOL;  Surgeon: Jonathon Bellows, MD;  Location:  [facility] ENDOSCOPY;  Service: Gastroenterology;  Laterality: N/A;   HAND SURGERY Bilateral    HERNIA REPAIR     x 2   KNEE ARTHROPLASTY Left 11/22/2021   Procedure: COMPUTER ASSISTED TOTAL KNEE ARTHROPLASTY;  Surgeon: Dereck Leep, MD;  Location: [facility] ORS;  Service: Orthopedics;  Laterality: Left;   NOSE SURGERY     REPLACEMENT TOTAL KNEE Right    REPLACEMENT TOTAL KNEE Left 11/22/2021    Medical History: Past Medical History:  Diagnosis Date   Anxiety  and depression    Arthritis    Complication of anesthesia    woke up during surgery   Frequency    Gallstones 03/05/2013   Headache    Hypertension    Kidney problem 11/2011   Mixed hyperlipidemia    OAB (overactive bladder)    Obstructive sleep apnea (adult) (pediatric)    Pneumonia     Family History: Family History  Problem Relation Age of Onset   COPD Mother    Heart disease Mother    Cancer Father        Brain tumor   Lung cancer Paternal Uncle    Cancer - Other Maternal Grandmother        Back   Kidney disease Neg Hx    Prostate cancer Neg Hx     Social History   Socioeconomic History   Marital status: Married    Spouse name: Margaretha Sheffield   Number of children: Not on file   Years of education: Not on file   Highest education level: Not on file  Occupational History   Not on file  Tobacco Use   Smoking status: Some Days    Types: Cigars   Smokeless tobacco: Current    Types: Chew   Tobacco comments:    smokes cigars onces in a while  Vaping Use   Vaping Use: Never used  Substance and Sexual Activity   Alcohol use: No    Alcohol/week: 0.0 standard drinks of alcohol   Drug use: No   Sexual activity: Not on file  Other Topics Concern   Not on file  Social History Narrative   Live with wife.   Social Determinants of Health   Financial Resource Strain: Low Risk  (06/11/2021)   Overall Financial Resource Strain (CARDIA)    Difficulty of Paying Living Expenses: Not hard at all  Food Insecurity: Not on file  Transportation Needs: Not on file  Physical Activity: Not on file  Stress: Not on file  Social Connections: Not on file  Intimate Partner Violence: Not on file      Review of Systems  Constitutional:  Negative for chills, fatigue and unexpected weight change.  HENT:  Negative for congestion, rhinorrhea, sneezing and sore throat.   Eyes:  Negative for redness.  Respiratory:  Negative for cough, chest tightness and shortness of breath.    Cardiovascular:  Negative for chest pain and palpitations.  Gastrointestinal:  Negative for abdominal pain, constipation, diarrhea, nausea and vomiting.  Genitourinary:  Negative for dysuria and frequency.  Musculoskeletal:  Positive for arthralgias, back pain, gait problem and joint swelling. Negative for neck pain.  Skin:  Negative for rash.  Neurological:  Negative for tremors and numbness.  Hematological:  Negative for adenopathy. Does not bruise/bleed easily.  Psychiatric/Behavioral:  Negative for behavioral problems (Depression), sleep disturbance and suicidal ideas. The patient is not nervous/anxious.     Vital Signs: BP 127/71   Pulse 67   Temp 98.3 F (36.8 C)  Resp 16   Ht '5\' 9"'$  (1.753 m)   Wt 298 lb (135.2 kg)   SpO2 98%   BMI 44.01 kg/m    Physical Exam Vitals reviewed.  Constitutional:      General: He is not in acute distress.    Appearance: Normal appearance. He is obese. He is not ill-appearing.  HENT:     Head: Normocephalic and atraumatic.  Eyes:     Extraocular Movements: Extraocular movements intact.     Pupils: Pupils are equal, round, and reactive to light.  Cardiovascular:     Rate and Rhythm: Normal rate and regular rhythm.  Pulmonary:     Effort: Pulmonary effort is normal. No respiratory distress.  Neurological:     Mental Status: He is alert and oriented to person, place, and time.     Cranial Nerves: No cranial nerve deficit.     Coordination: Coordination normal.     Gait: Gait normal.  Psychiatric:        Mood and Affect: Mood normal.        Behavior: Behavior normal.        Assessment/Plan: 1. Dilated aortic root (HCC) Repeat echocardiogram in 1 year  2. Essential (primary) hypertension Stable and well controlled with current medications, refills ordered.  - losartan (COZAAR) 25 MG tablet; Take 1 tablet (25 mg total) by mouth daily.  Dispense: 90 tablet; Refill: 1  3. Atherosclerosis of aorta (HCC) Continue rosuvastatin as  prescribed   4. Primary generalized (osteo)arthritis Continue celebrex as prescribed. Pain is manageable.   5. Status post total left knee replacement Recovering from recent knee surgery, still going to physical therapy. Doing well. Pain is tolerable.    General Counseling: Marquavis verbalizes understanding of the findings of todays visit and agrees with plan of treatment. I have discussed any further diagnostic evaluation that may be needed or ordered today. We also reviewed his medications today. he has been encouraged to call the office with any questions or concerns that should arise related to todays visit.    No orders of the defined types were placed in this encounter.   Meds ordered this encounter  Medications   losartan (COZAAR) 25 MG tablet    Sig: Take 1 tablet (25 mg total) by mouth daily.    Dispense:  90 tablet    Refill:  1    Return in about 4 months (around 04/23/2022) for F/U, Darling Cieslewicz PCP.   Total time spent:30 Minutes Time spent includes review of chart, medications, test results, and follow up plan with the patient.   [place] Controlled Substance Database was reviewed by me.  This patient was seen by Jonetta Osgood, FNP-C in collaboration with Dr. Clayborn Bigness as a part of collaborative care agreement.   Danyetta Gillham R. Valetta Fuller, MSN, FNP-C Internal medicine

## 2021-12-22 ENCOUNTER — Ambulatory Visit: Payer: Medicare HMO

## 2021-12-23 DIAGNOSIS — Z96652 Presence of left artificial knee joint: Secondary | ICD-10-CM | POA: Diagnosis not present

## 2021-12-23 DIAGNOSIS — M25562 Pain in left knee: Secondary | ICD-10-CM | POA: Diagnosis not present

## 2021-12-23 DIAGNOSIS — R531 Weakness: Secondary | ICD-10-CM | POA: Diagnosis not present

## 2021-12-23 DIAGNOSIS — M25662 Stiffness of left knee, not elsewhere classified: Secondary | ICD-10-CM | POA: Diagnosis not present

## 2021-12-27 DIAGNOSIS — Z96652 Presence of left artificial knee joint: Secondary | ICD-10-CM | POA: Diagnosis not present

## 2021-12-27 DIAGNOSIS — M25562 Pain in left knee: Secondary | ICD-10-CM | POA: Diagnosis not present

## 2021-12-29 ENCOUNTER — Ambulatory Visit (INDEPENDENT_AMBULATORY_CARE_PROVIDER_SITE_OTHER): Payer: Medicare HMO

## 2021-12-29 DIAGNOSIS — G4733 Obstructive sleep apnea (adult) (pediatric): Secondary | ICD-10-CM | POA: Diagnosis not present

## 2021-12-29 NOTE — Progress Notes (Signed)
95 percentile pressure 9.9   95th percentile leak 29.1   apnea index 0.7 /hr  apnea-hypopnea index  1.7 /hr   total days used  >4 hr 85 days  total days used <4 hr 3 days  Total compliance 94 percent  He is doing great , but does need new supplies. He needs new order and chart notes sent to [facility] since it has been over a year since he has had any supplies   Pt was seen by Claiborne Billings  RRT/RCP  from [facility]

## 2021-12-30 DIAGNOSIS — Z96652 Presence of left artificial knee joint: Secondary | ICD-10-CM | POA: Diagnosis not present

## 2021-12-30 DIAGNOSIS — M25562 Pain in left knee: Secondary | ICD-10-CM | POA: Diagnosis not present

## 2022-01-04 DIAGNOSIS — Z96652 Presence of left artificial knee joint: Secondary | ICD-10-CM | POA: Diagnosis not present

## 2022-01-04 DIAGNOSIS — M25562 Pain in left knee: Secondary | ICD-10-CM | POA: Diagnosis not present

## 2022-01-06 ENCOUNTER — Ambulatory Visit: Payer: Medicare HMO | Admitting: Physician Assistant

## 2022-01-06 ENCOUNTER — Encounter: Payer: Self-pay | Admitting: Physician Assistant

## 2022-01-06 VITALS — BP 128/66 | HR 65 | Temp 98.3°F | Resp 16 | Ht 69.0 in | Wt 302.0 lb

## 2022-01-06 DIAGNOSIS — I10 Essential (primary) hypertension: Secondary | ICD-10-CM

## 2022-01-06 DIAGNOSIS — Z7189 Other specified counseling: Secondary | ICD-10-CM

## 2022-01-06 DIAGNOSIS — R0602 Shortness of breath: Secondary | ICD-10-CM | POA: Diagnosis not present

## 2022-01-06 DIAGNOSIS — Z6841 Body Mass Index (BMI) 40.0 and over, adult: Secondary | ICD-10-CM

## 2022-01-06 DIAGNOSIS — J9601 Acute respiratory failure with hypoxia: Secondary | ICD-10-CM | POA: Diagnosis not present

## 2022-01-06 DIAGNOSIS — G4733 Obstructive sleep apnea (adult) (pediatric): Secondary | ICD-10-CM

## 2022-01-06 NOTE — Patient Instructions (Signed)

## 2022-01-06 NOTE — Progress Notes (Signed)
[facility] [place], [place] [address]  Pulmonary Sleep Medicine   Office Visit Note  Patient Name: Jimmy Norton. DOB: May 31, 1946 MRN [id number]  Date of Service: 01/06/2022  Complaints/HPI: Pt is here for routine pulmonary follow up for OSA on CPAP. He is using and benefiting from CPAP use. He denies headaches, but does have some dryness. He uses nasal mask due to having facial hair, but he also chews tobacco and may contribute to his dryness as he feels this throughout the day. He is overdue for supplies as well and will order these today. His compliance is 94% and AHI well controlled at 1.7. Wears 2L oxygen at night with his CPAP. He has been chewing tobacco since he was 20, but has never smoked cigarettes. Will occasionally smoke cigars/pipe. He does mention some SOB with exertion that he states subsides with rest. He has never had PFT done and will move forward with this now. He had an echo already with his PCP.  ROS  General: (-) fever, (-) chills, (-) night sweats, (-) weakness Skin: (-) rashes, (-) itching,. Eyes: (-) visual changes, (-) redness, (-) itching. Nose and Sinuses: (-) nasal stuffiness or itchiness, (-) postnasal drip, (-) nosebleeds, (-) sinus trouble. Mouth and Throat: (-) sore throat, (-) hoarseness. Neck: (-) swollen glands, (-) enlarged thyroid, (-) neck pain. Respiratory: - cough, (-) bloody sputum, + shortness of breath, - wheezing. Cardiovascular: - ankle swelling, (-) chest pain. Lymphatic: (-) lymph node enlargement. Neurologic: (-) numbness, (-) tingling. Psychiatric: (-) anxiety, (-) depression   Current Medication: Outpatient Encounter Medications as of 01/06/2022  Medication Sig   celecoxib (CELEBREX) 200 MG capsule Take 1 capsule (200 mg total) by mouth 2 (two) times daily.   cholecalciferol (VITAMIN D) 400 UNITS TABS tablet Take 400 Units by mouth daily.   Cyanocobalamin (B-12) 2500 MCG TABS Take 2,500 mcg by mouth  daily.   FLUoxetine (PROZAC) 20 MG capsule Take 1 capsule (20 mg total) by mouth daily.   gabapentin (NEURONTIN) 100 MG capsule Take 100 mg by mouth once.   GLUCOSAMINE-CHONDROITIN PO Take 1 tablet by mouth 2 (two) times daily.   losartan (COZAAR) 25 MG tablet Take 1 tablet (25 mg total) by mouth daily.   Misc. Devices MISC cpap   Multiple Vitamins-Minerals (MULTIVITAMIN WITH MINERALS) tablet Take 1 tablet by mouth daily. Reported on 09/11/2015   Probiotic Product (PROBIOTIC-10 PO) Take 1 capsule by mouth daily.   Pyridoxine HCl (B-6) 100 MG TABS Take 100 mg by mouth daily.   rosuvastatin (CRESTOR) 5 MG tablet Take 1 tablet (5 mg total) by mouth daily.   terazosin (HYTRIN) 10 MG capsule Take 1 capsule (10 mg total) by mouth at bedtime.   enoxaparin (LOVENOX) 40 MG/0.4ML injection Inject 0.4 mLs (40 mg total) into the skin daily for 14 days.   [DISCONTINUED] oxyCODONE (OXY IR/ROXICODONE) 5 MG immediate release tablet Take 1 tablet (5 mg total) by mouth every 4 (four) hours as needed for severe pain. (Patient not taking: Reported on 01/06/2022)   [DISCONTINUED] traMADol (ULTRAM) 50 MG tablet Take 1 tablet (50 mg total) by mouth every 4 (four) hours as needed for moderate pain. (Patient not taking: Reported on 01/06/2022)   No facility-administered encounter medications on file as of 01/06/2022.    Surgical History: Past Surgical History:  Procedure Laterality Date   COLONOSCOPY WITH PROPOFOL N/A 05/11/2021   Procedure: COLONOSCOPY WITH PROPOFOL;  Surgeon: Jonathon Bellows, MD;  Location: [facility] ENDOSCOPY;  Service: Gastroenterology;  Laterality: N/A;   HAND SURGERY Bilateral    HERNIA REPAIR     x 2   KNEE ARTHROPLASTY Left 11/22/2021   Procedure: COMPUTER ASSISTED TOTAL KNEE ARTHROPLASTY;  Surgeon: Dereck Leep, MD;  Location: [facility] ORS;  Service: Orthopedics;  Laterality: Left;   NOSE SURGERY     REPLACEMENT TOTAL KNEE Right    REPLACEMENT TOTAL KNEE Left 11/22/2021    Medical History: Past  Medical History:  Diagnosis Date   Anxiety and depression    Arthritis    Complication of anesthesia    woke up during surgery   Frequency    Gallstones 03/05/2013   Headache    Hypertension    Kidney problem 11/2011   Mixed hyperlipidemia    OAB (overactive bladder)    Obstructive sleep apnea (adult) (pediatric)    Pneumonia     Family History: Family History  Problem Relation Age of Onset   COPD Mother    Heart disease Mother    Cancer Father        Brain tumor   Lung cancer Paternal Uncle    Cancer - Other Maternal Grandmother        Back   Kidney disease Neg Hx    Prostate cancer Neg Hx     Social History: Social History   Socioeconomic History   Marital status: Married    Spouse name: Margaretha Sheffield   Number of children: Not on file   Years of education: Not on file   Highest education level: Not on file  Occupational History   Not on file  Tobacco Use   Smoking status: Some Days    Types: Cigars   Smokeless tobacco: Current    Types: Chew   Tobacco comments:    smokes cigars onces in a while  Vaping Use   Vaping Use: Never used  Substance and Sexual Activity   Alcohol use: No    Alcohol/week: 0.0 standard drinks of alcohol   Drug use: No   Sexual activity: Not on file  Other Topics Concern   Not on file  Social History Narrative   Live with wife.   Social Determinants of Health   Financial Resource Strain: Low Risk  (06/11/2021)   Overall Financial Resource Strain (CARDIA)    Difficulty of Paying Living Expenses: Not hard at all  Food Insecurity: Not on file  Transportation Needs: Not on file  Physical Activity: Not on file  Stress: Not on file  Social Connections: Not on file  Intimate Partner Violence: Not on file    Vital Signs: Blood pressure 128/66, pulse 65, temperature 98.3 F (36.8 C), resp. rate 16, height '5\' 9"'$  (1.753 m), weight (!) 302 lb (137 kg), SpO2 98 %.  Examination: General Appearance: The patient is well-developed,  well-nourished, and in no distress. Skin: Gross inspection of skin unremarkable. Head: normocephalic, no gross deformities. Eyes: no gross deformities noted. ENT: ears appear grossly normal no exudates. Neck: Supple. No thyromegaly. No LAD. Respiratory: Lungs clear to auscultation bilaterally. Cardiovascular: Normal S1 and S2 without murmur or rub. Extremities: No cyanosis. pulses are equal. Neurologic: Alert and oriented. No involuntary movements.  LABS: Recent Results (from the past 2160 hour(s))  Type and screen [place]     Status: None   Collection Time: 11/09/21  3:15 PM  Result Value Ref Range   ABO/RH(D) O POS    Antibody Screen NEG    Sample Expiration 11/23/2021,2359  Extend sample reason      NO TRANSFUSIONS OR PREGNANCY IN THE PAST 3 MONTHS Performed at [facility], [address] [place], [place] [address]   Sedimentation rate     Status: None   Collection Time: 11/09/21  3:15 PM  Result Value Ref Range   Sed Rate 3 0 - 20 mm/hr    Comment: Performed at [facility], [address] [place], [place] [address]  C-reactive protein     Status: None   Collection Time: 11/09/21  3:15 PM  Result Value Ref Range   CRP 0.8 <1.0 mg/dL    Comment: Performed at [facility], [address] [place], [place] [address]  IgE     Status: None   Collection Time: 11/09/21  3:15 PM  Result Value Ref Range   IgE (Immunoglobulin E), Serum 82 6 - 495 IU/mL    Comment: (NOTE) Performed At: [facility] [place], [place] [id number] Rush Farmer MD VP:[id number]   CBC     Status: None   Collection Time: 11/09/21  3:15 PM  Result Value Ref Range   WBC 7.0 4.0 - 10.5 K/uL   RBC 5.17 4.22 - 5.81 MIL/uL   Hemoglobin 14.4 13.0 - 17.0 g/dL   HCT 45.0 39.0 - 52.0 %   MCV 87.0 80.0 - 100.0 fL   MCH 27.9 26.0 - 34.0 pg   MCHC 32.0 30.0 - 36.0 g/dL   RDW 13.5 11.5 - 15.5 %   Platelets 218 150 - 400 K/uL    nRBC 0.0 0.0 - 0.2 %    Comment: Performed at [facility], [address] [place], [place] [address]  Comprehensive metabolic panel     Status: None   Collection Time: 11/09/21  3:15 PM  Result Value Ref Range   Sodium 140 135 - 145 mmol/L   Potassium 3.7 3.5 - 5.1 mmol/L   Chloride 106 98 - 111 mmol/L   CO2 25 22 - 32 mmol/L   Glucose, Bld 92 70 - 99 mg/dL    Comment: Glucose reference range applies only to samples taken after fasting for at least 8 hours.   BUN 12 8 - 23 mg/dL   Creatinine, Ser 0.78 0.61 - 1.24 mg/dL   Calcium 8.9 8.9 - 10.3 mg/dL   Total Protein 7.0 6.5 - 8.1 g/dL   Albumin 3.6 3.5 - 5.0 g/dL   AST 26 15 - 41 U/L   ALT 22 0 - 44 U/L   Alkaline Phosphatase 62 38 - 126 U/L   Total Bilirubin 0.7 0.3 - 1.2 mg/dL   GFR, Estimated >60 >60 mL/min    Comment: (NOTE) Calculated using the CKD-EPI Creatinine Equation (2021)    Anion gap 9 5 - 15    Comment: Performed at [facility], [address] [place], [place] [address]  Urinalysis, Routine w reflex microscopic Urine, Clean Catch     Status: Abnormal   Collection Time: 11/09/21  3:15 PM  Result Value Ref Range   Color, Urine YELLOW (A) YELLOW   APPearance CLEAR (A) CLEAR   Specific Gravity, Urine 1.011 1.005 - 1.030   pH 5.0 5.0 - 8.0   Glucose, UA NEGATIVE NEGATIVE mg/dL   Hgb urine dipstick NEGATIVE NEGATIVE   Bilirubin Urine NEGATIVE NEGATIVE   Ketones, ur NEGATIVE NEGATIVE mg/dL   Protein, ur NEGATIVE NEGATIVE mg/dL   Nitrite NEGATIVE NEGATIVE   Leukocytes,Ua NEGATIVE NEGATIVE  Comment: Performed at [facility], [address], [place], [place] [address]  Surgical pcr screen     Status: None   Collection Time: 11/09/21  3:36 PM   Specimen: Nasal Mucosa; Nasal Swab  Result Value Ref Range   MRSA, PCR NEGATIVE NEGATIVE   Staphylococcus aureus NEGATIVE NEGATIVE    Comment: (NOTE) The Xpert SA Assay (FDA approved for NASAL specimens in patients 29 years of age and  older), is one component of a comprehensive surveillance program. It is not intended to diagnose infection nor to guide or monitor treatment. Performed at [facility], [address] [place], [place] [address]   ABO/Rh     Status: None   Collection Time: 11/22/21  6:45 AM  Result Value Ref Range   ABO/RH(D)      O POS Performed at [facility], [address] [place], [place] [address]     Radiology: DG Knee Left Port  Result Date: 11/22/2021 CLINICAL DATA:  Post LEFT total knee arthroplasty EXAM: PORTABLE LEFT KNEE - 1-2 VIEW COMPARISON:  Portable exam 1155 hours without priors for comparison FINDINGS: LEFT knee prosthesis identified. Mild osseous demineralization. No acute fracture, dislocation, or bone destruction. Surgical drains and skin clips anteriorly. IMPRESSION: LEFT knee prosthesis without acute complication. Electronically Signed   By: Lavonia Dana M.D.   On: 11/22/2021 12:02    No results found.  No results found.    Assessment and Plan: Patient Active Problem List   Diagnosis Date Noted   Unspecified astigmatism, bilateral 11/22/2021   Balanitis 11/22/2021   Total knee replacement status 11/22/2021   Numbness and tingling in right hand 09/25/2021   Hardening of the aorta (main artery of the heart) ([place]) 08/29/2021   Exposure to potentially hazardous substance 08/29/2021   Bilateral sensorineural hearing loss 08/29/2021   Age-related nuclear cataract, bilateral 08/29/2021   Noncompliance with medication regimen 12/15/2020   Syncope 04/15/2020   Abrasion of left forearm 04/15/2020   Abrasion of right lower leg 04/15/2020   Laceration of left forearm without complication 96/29/5284   Encounter for general adult medical examination with abnormal findings 08/27/2019   Left knee pain 08/27/2019   Cervical stenosis of spinal canal 11/06/2018   Screening for prostate cancer 07/30/2018   Dysuria 07/30/2018   Arthritis of carpometacarpal ([place])  joint of right thumb 07/02/2018   Primary generalized (osteo)arthritis 06/10/2018   Cervical disc disease with myelopathy 06/10/2018   Anxiety 06/08/2018   Depression 06/08/2018   BPH (benign prostatic hyperplasia) 06/08/2018   Neuropathy 06/08/2018   Morbid obesity with body mass index (BMI) of 40.0 to 44.9 in adult ([place]) 11/30/2017   Tick bite 11/27/2017   Plantar fasciitis 11/27/2017   Primary osteoarthritis of right elbow 11/27/2017   OSA on CPAP 08/24/2017   Mixed hyperlipidemia 07/28/2017   Allergic rhinitis due to pollen 07/28/2017   Hypoxemia 07/28/2017   Calculus of kidney 07/28/2017   Chest pain, unspecified 07/28/2017   Major depressive disorder, recurrent, moderate ([place]) 07/28/2017   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 Obstructive sleep apnea Continue excellent compliance and will order new supplies - For home use only DME continuous positive airway pressure (CPAP)  2. CPAP use counseling CPAP couseling-Discussed importance of adequate CPAP use as well as proper care and cleaning techniques of machine and all supplies.  3. SOBOE (shortness of breath on exertion) Will order PFT for further evaluation - Pulmonary Function Test; Future  4. Essential (primary) hypertension Well controlled, followed by PCP  5. Morbid obesity with BMI of 40.0-44.9, adult ([place]) Obesity Counseling: Had a lengthy discussion regarding patients BMI and weight issues. Patient was instructed on  portion control as well as increased activity. Also discussed caloric restrictions with trying to maintain intake less than 2000 Kcal. Discussions were made in accordance with the 5As of weight management. Simple actions such as not eating late and if able to, taking a walk is suggested.    General Counseling: I have discussed the findings of the evaluation and examination with Lyndal.  I have also discussed any further diagnostic evaluation thatmay be needed or ordered today. Yancey verbalizes understanding of the findings of todays visit. We also reviewed his medications today and discussed drug interactions and side effects including but not limited excessive drowsiness and altered mental states. We also discussed that there is always a risk not just to him but also people around him. he has been encouraged to call the office with any questions or concerns that should arise related to todays visit.  Orders Placed This Encounter  Procedures   For home use only DME continuous positive airway pressure (CPAP)    Supplies only--AHP    Order Specific Question:   Length of Need    Answer:   Lifetime    Order Specific Question:   Patient has OSA or probable OSA    Answer:   Yes    Order Specific Question:   Is the patient currently using CPAP in the home    Answer:   Yes    Order Specific Question:   Settings    Answer:   Other see comments    Order Specific Question:   CPAP supplies needed    Answer:   Mask, headgear, cushions, filters, heated tubing and water chamber   Pulmonary Function Test    Standing Status:   Future    Standing Expiration Date:   03/08/2022    Order Specific Question:   Where should this test be performed?    Answer:   [facility]    Order Specific Question:   Full PFT: includes the following: basic spirometry, spirometry pre & post bronchodilator, diffusion capacity (DLCO), lung volumes    Answer:   Full PFT     Time spent: 30  I have personally obtained a  history, examined the patient, evaluated laboratory and imaging results, formulated the assessment and plan and placed orders. This patient was seen by Drema Dallas, PA-C in collaboration with Dr. Devona Konig as a part of collaborative care agreement.     Allyne Gee, MD [facility] Pulmonary and Critical Care Sleep medicine

## 2022-01-07 ENCOUNTER — Telehealth: Payer: Self-pay

## 2022-01-07 NOTE — Telephone Encounter (Signed)
Sent community message  to sarah with [facility] for CPAP 01/07/22 at 120pm

## 2022-02-05 DIAGNOSIS — J9601 Acute respiratory failure with hypoxia: Secondary | ICD-10-CM | POA: Diagnosis not present

## 2022-02-05 DIAGNOSIS — G4733 Obstructive sleep apnea (adult) (pediatric): Secondary | ICD-10-CM | POA: Diagnosis not present

## 2022-02-06 ENCOUNTER — Encounter: Payer: Self-pay | Admitting: Nurse Practitioner

## 2022-02-11 DIAGNOSIS — G4733 Obstructive sleep apnea (adult) (pediatric): Secondary | ICD-10-CM | POA: Diagnosis not present

## 2022-03-02 ENCOUNTER — Ambulatory Visit: Payer: Medicare HMO | Admitting: Internal Medicine

## 2022-03-02 DIAGNOSIS — R0602 Shortness of breath: Secondary | ICD-10-CM

## 2022-03-08 DIAGNOSIS — G4733 Obstructive sleep apnea (adult) (pediatric): Secondary | ICD-10-CM | POA: Diagnosis not present

## 2022-03-08 DIAGNOSIS — J9601 Acute respiratory failure with hypoxia: Secondary | ICD-10-CM | POA: Diagnosis not present

## 2022-03-13 NOTE — Procedures (Signed)
[facility] [address] [place], [address]    Complete Pulmonary Function Testing Interpretation:  FINDINGS:  Forced vital capacity is moderately decreased FEV1 is 2.39 L which is 82% predicted and is within normal limits.  FEV1 FVC ratio was normal.  Postbronchodilator no significant change was noted.  Total lung capacity is moderately decreased.  Residual volume is decreased residual on total lung capacity ratio is decreased FRC was decreased.  DLCO was mildly decreased.  IMPRESSION:  This pulmonary function study is suggestive of a moderate restrictive lung disease clinical correlation is recommended  Allyne Gee, MD [facility] Pulmonary Critical Care Medicine Sleep Medicine

## 2022-03-14 LAB — PULMONARY FUNCTION TEST

## 2022-04-08 DIAGNOSIS — G4733 Obstructive sleep apnea (adult) (pediatric): Secondary | ICD-10-CM | POA: Diagnosis not present

## 2022-04-08 DIAGNOSIS — J9601 Acute respiratory failure with hypoxia: Secondary | ICD-10-CM | POA: Diagnosis not present

## 2022-04-25 ENCOUNTER — Ambulatory Visit (INDEPENDENT_AMBULATORY_CARE_PROVIDER_SITE_OTHER): Payer: Medicare HMO | Admitting: Nurse Practitioner

## 2022-04-25 ENCOUNTER — Encounter: Payer: Self-pay | Admitting: Nurse Practitioner

## 2022-04-25 VITALS — BP 131/80 | HR 76 | Temp 96.5°F | Resp 16 | Ht 69.0 in | Wt 295.6 lb

## 2022-04-25 DIAGNOSIS — Z91148 Patient's other noncompliance with medication regimen for other reason: Secondary | ICD-10-CM

## 2022-04-25 DIAGNOSIS — N3281 Overactive bladder: Secondary | ICD-10-CM

## 2022-04-25 DIAGNOSIS — R0602 Shortness of breath: Secondary | ICD-10-CM | POA: Diagnosis not present

## 2022-04-25 DIAGNOSIS — I10 Essential (primary) hypertension: Secondary | ICD-10-CM | POA: Diagnosis not present

## 2022-04-25 DIAGNOSIS — M150 Primary generalized (osteo)arthritis: Secondary | ICD-10-CM

## 2022-04-25 MED ORDER — LOSARTAN POTASSIUM 25 MG PO TABS: 25.0000 mg | ORAL_TABLET | Freq: Every day | ORAL | 1 refills | Status: AC

## 2022-04-25 NOTE — Progress Notes (Signed)
[facility] [place], [place] [address]  Internal MEDICINE  Office Visit Note  Patient Name: Jimmy Norton  [id number]  [id number]  Date of Service: 04/25/2022  Chief Complaint  Patient presents with   Hyperlipidemia   Hypertension   Depression   Follow-up    HPI Jimmy Norton presents for a follow up visit for hypertension, osteoarthritis and overactive bladder.  Hypertension -- BP controlled most of the time. Still forgets to take his meds sometimes but is trying his best to remember to take them consistently  Right sided osteoarthritis -- has right knee replaced a few months ago, since then his whole right side except the right knee hurts. He has right side neck pain, right shoulder pain and numbness, right hand burning and tingling and right hip pain as well. Was supposed to get an MRI done but the machine was broken, still needs to call them to reschedule. Despite pain, still working around the house and his workshop and mowing the lawn at his church and [employer]. OAB -- wakes up multiple times per night, states not sure if the doxazosin works but is not taking the medication consistently Mild COPD -- PFT shows mild restrictive lung disease. Used to smoke cigarettes, has been around second hand smoke most of his life, still smokes cigars sometimes and chews tobacco. Last imaging mentioning the lungs was in 2018, scarring at the base of the lungs bilaterally was noted. Medication adherence Change in vision -recent eye exam at [facility], new glasses ordered, vision has gotten worse. 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    Current Medication: Outpatient Encounter Medications as of 04/25/2022  Medication Sig   celecoxib (CELEBREX) 200 MG capsule Take 1 capsule (200 mg total) by mouth 2 (two) times daily.   cholecalciferol (VITAMIN D) 400 UNITS TABS tablet Take 400 Units by mouth daily.   Cyanocobalamin  (B-12) 2500 MCG TABS Take 2,500 mcg by mouth daily.   FLUoxetine (PROZAC) 20 MG capsule Take 1 capsule (20 mg total) by mouth daily.   gabapentin (NEURONTIN) 100 MG capsule Take 100 mg by mouth once.   GLUCOSAMINE-CHONDROITIN PO Take 1 tablet by mouth 2 (two) times daily.   Misc. Devices MISC cpap   Multiple Vitamins-Minerals (MULTIVITAMIN WITH MINERALS) tablet Take 1 tablet by mouth daily. Reported on 09/11/2015   Probiotic Product (PROBIOTIC-10 PO) Take 1 capsule by mouth daily.   Pyridoxine HCl (B-6) 100 MG TABS Take 100 mg by mouth daily.   rosuvastatin (CRESTOR) 5 MG tablet Take 1 tablet (5 mg total) by mouth daily.   terazosin (HYTRIN) 10 MG capsule Take 1 capsule (10 mg total) by mouth at bedtime.   [DISCONTINUED] losartan (COZAAR) 25 MG tablet Take 1 tablet (25 mg total) by mouth daily.   enoxaparin (LOVENOX) 40 MG/0.4ML injection Inject 0.4 mLs (40 mg total) into the skin daily for 14 days.   losartan (COZAAR) 25 MG tablet Take 1 tablet (25 mg total) by mouth daily.   No facility-administered encounter medications on file as of 04/25/2022.    Surgical History: Past Surgical History:  Procedure Laterality Date   COLONOSCOPY WITH PROPOFOL N/A 05/11/2021   Procedure: COLONOSCOPY WITH PROPOFOL;  Surgeon: Jonathon Bellows, MD;  Location: [facility] ENDOSCOPY;  Service: Gastroenterology;  Laterality: N/A;   HAND SURGERY Bilateral    HERNIA REPAIR     x 2   KNEE ARTHROPLASTY Left  11/22/2021   Procedure: COMPUTER ASSISTED TOTAL KNEE ARTHROPLASTY;  Surgeon: Dereck Leep, MD;  Location: [facility] ORS;  Service: Orthopedics;  Laterality: Left;   NOSE SURGERY     REPLACEMENT TOTAL KNEE Right    REPLACEMENT TOTAL KNEE Left 11/22/2021    Medical History: Past Medical History:  Diagnosis Date   Anxiety and depression    Arthritis    Complication of anesthesia    woke up during surgery   Frequency    Gallstones 03/05/2013   Headache    Hypertension    Kidney problem 11/2011   Mixed  hyperlipidemia    OAB (overactive bladder)    Obstructive sleep apnea (adult) (pediatric)    Pneumonia     Family History: Family History  Problem Relation Age of Onset   COPD Mother    Heart disease Mother    Cancer Father        Brain tumor   Lung cancer Paternal Uncle    Cancer - Other Maternal Grandmother        Back   Kidney disease Neg Hx    Prostate cancer Neg Hx     Social History   Socioeconomic History   Marital status: Married    Spouse name: Margaretha Sheffield   Number of children: Not on file   Years of education: Not on file   Highest education level: Not on file  Occupational History   Not on file  Tobacco Use   Smoking status: Some Days    Types: Cigars   Smokeless tobacco: Current    Types: Chew   Tobacco comments:    smokes cigars onces in a while  Vaping Use   Vaping Use: Never used  Substance and Sexual Activity   Alcohol use: No    Alcohol/week: 0.0 standard drinks of alcohol   Drug use: No   Sexual activity: Not on file  Other Topics Concern   Not on file  Social History Narrative   Live with wife.   Social Determinants of Health   Financial Resource Strain: Low Risk  (06/11/2021)   Overall Financial Resource Strain (CARDIA)    Difficulty of Paying Living Expenses: Not hard at all  Food Insecurity: Not on file  Transportation Needs: Not on file  Physical Activity: Not on file  Stress: Not on file  Social Connections: Not on file  Intimate Partner Violence: Not on file      Review of Systems  Constitutional:  Negative for chills, fatigue and unexpected weight change.  HENT:  Negative for congestion, rhinorrhea, sneezing and sore throat.   Eyes:  Negative for redness.  Respiratory:  Negative for cough, chest tightness and shortness of breath.   Cardiovascular:  Negative for chest pain and palpitations.  Gastrointestinal:  Negative for abdominal pain, constipation, diarrhea, nausea and vomiting.  Genitourinary:  Negative for dysuria and  frequency.  Musculoskeletal:  Positive for arthralgias, back pain, gait problem and joint swelling. Negative for neck pain.  Skin:  Negative for rash.  Neurological:  Negative for tremors and numbness.  Hematological:  Negative for adenopathy. Does not bruise/bleed easily.  Psychiatric/Behavioral:  Negative for behavioral problems (Depression), sleep disturbance and suicidal ideas. The patient is not nervous/anxious.     Vital Signs: BP 131/80   Pulse 76   Temp (!) 96.5 F (35.8 C)   Resp 16   Ht '5\' 9"'$  (1.753 m)   Wt 295 lb 9.6 oz (134.1 kg)   SpO2 96%   BMI 43.65  kg/m    Physical Exam Vitals reviewed.  Constitutional:      General: He is not in acute distress.    Appearance: Normal appearance. He is obese. He is not ill-appearing.  HENT:     Head: Normocephalic and atraumatic.  Eyes:     Extraocular Movements: Extraocular movements intact.     Pupils: Pupils are equal, round, and reactive to light.  Cardiovascular:     Rate and Rhythm: Normal rate and regular rhythm.  Pulmonary:     Effort: Pulmonary effort is normal. No respiratory distress.  Neurological:     Mental Status: He is alert and oriented to person, place, and time.     Cranial Nerves: No cranial nerve deficit.     Coordination: Coordination normal.     Gait: Gait normal.  Psychiatric:        Mood and Affect: Mood normal.        Behavior: Behavior normal.        Assessment/Plan: 1. Essential (primary) hypertension Stable, continue as prescribed.  - losartan (COZAAR) 25 MG tablet; Take 1 tablet (25 mg total) by mouth daily.  Dispense: 90 tablet; Refill: 1  2. SOBOE (shortness of breath on exertion) Consider chest xray or CT chest, not significantly bothering him enough to need a maintenance inhaler, will continue to monitor  3. Primary generalized (osteo)arthritis Predominantly right sided, seeing specialist, will call to reschedule his MRI  4. OAB (overactive bladder) Patient instructed to  take doxazosin daily at bedtime as prescribed consistently. If still no improvement, we can discuss alternative medications that may work better for him  5. Nonadherence to medication Forgets sometimes but has been doing better remembering to take him medications. Patient encouraged to continue to work hard   General Counseling: Drayson verbalizes understanding of the findings of todays visit and agrees with plan of treatment. I have discussed any further diagnostic evaluation that may be needed or ordered today. We also reviewed his medications today. he has been encouraged to call the office with any questions or concerns that should arise related to todays visit.    No orders of the defined types were placed in this encounter.   Meds ordered this encounter  Medications   losartan (COZAAR) 25 MG tablet    Sig: Take 1 tablet (25 mg total) by mouth daily.    Dispense:  90 tablet    Refill:  1    Return in 22 weeks (on 09/26/2022) for previously scheduled, CPE, Andric Kerce PCP.   Total time spent:30 Minutes Time spent includes review of chart, medications, test results, and follow up plan with the patient.   [place] Controlled Substance Database was reviewed by me.  This patient was seen by Jonetta Osgood, FNP-C in collaboration with Dr. Clayborn Bigness as a part of collaborative care agreement.   Erline Siddoway R. Valetta Fuller, MSN, FNP-C Internal medicine

## 2022-05-08 DIAGNOSIS — J9601 Acute respiratory failure with hypoxia: Secondary | ICD-10-CM | POA: Diagnosis not present

## 2022-05-08 DIAGNOSIS — G4733 Obstructive sleep apnea (adult) (pediatric): Secondary | ICD-10-CM | POA: Diagnosis not present

## 2022-06-08 DIAGNOSIS — G4733 Obstructive sleep apnea (adult) (pediatric): Secondary | ICD-10-CM | POA: Diagnosis not present

## 2022-06-08 DIAGNOSIS — J9601 Acute respiratory failure with hypoxia: Secondary | ICD-10-CM | POA: Diagnosis not present

## 2022-07-08 DIAGNOSIS — G4733 Obstructive sleep apnea (adult) (pediatric): Secondary | ICD-10-CM | POA: Diagnosis not present

## 2022-07-08 DIAGNOSIS — J9601 Acute respiratory failure with hypoxia: Secondary | ICD-10-CM | POA: Diagnosis not present

## 2022-07-27 ENCOUNTER — Ambulatory Visit: Payer: Medicare HMO

## 2022-08-03 ENCOUNTER — Ambulatory Visit: Payer: Medicare HMO

## 2022-08-08 DIAGNOSIS — G4733 Obstructive sleep apnea (adult) (pediatric): Secondary | ICD-10-CM | POA: Diagnosis not present

## 2022-08-08 DIAGNOSIS — J9601 Acute respiratory failure with hypoxia: Secondary | ICD-10-CM | POA: Diagnosis not present

## 2022-08-22 ENCOUNTER — Encounter: Payer: Self-pay | Admitting: Physician Assistant

## 2022-08-22 ENCOUNTER — Ambulatory Visit: Payer: Medicare HMO | Admitting: Physician Assistant

## 2022-08-22 VITALS — BP 147/64 | HR 78 | Temp 98.1°F | Resp 16 | Ht 69.0 in | Wt 299.0 lb

## 2022-08-22 DIAGNOSIS — R0602 Shortness of breath: Secondary | ICD-10-CM

## 2022-08-22 DIAGNOSIS — J984 Other disorders of lung: Secondary | ICD-10-CM

## 2022-08-22 DIAGNOSIS — Z7189 Other specified counseling: Secondary | ICD-10-CM

## 2022-08-22 DIAGNOSIS — Z6841 Body Mass Index (BMI) 40.0 and over, adult: Secondary | ICD-10-CM | POA: Diagnosis not present

## 2022-08-22 DIAGNOSIS — G4733 Obstructive sleep apnea (adult) (pediatric): Secondary | ICD-10-CM | POA: Diagnosis not present

## 2022-08-22 NOTE — Progress Notes (Signed)
[facility] [place], [place] [address]  Pulmonary Sleep Medicine   Office Visit Note  Patient Name: Jimmy Norton. DOB: [birth year] MRN [id number]  Date of Service: 08/22/2022  Complaints/HPI: Pt is here for routine follow up. He is using his CPAP nightly with 2L oxygen. Does get some dryness and discussed increasing humidity, especially this time of year and changing supplies regularly. Admits he could do better with this. He states he will be establishing with the [facility] for pulmonary care as well due to hx of agent orange exposure. 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 Did review his PFT showing moderate restrictive lung disease, and given he remains SOB typically, I would recommend having a CT of his lungs for further evaluation. He is going to discuss this with the [facility] as they are doing a lot of testing already currently. Wrote down test name and PFT conclusion for him to show his provider there. He plans to continue to follow with [place] for his OSA until fully established with this at the [place].  ROS  General: (-) fever, (-) chills, (-) night sweats, (-) weakness Skin: (-) rashes, (-) itching,. Eyes: (-) visual changes, (-) redness, (-) itching. Nose and Sinuses: (-) nasal stuffiness or itchiness, (-) postnasal drip, (-) nosebleeds, (-) sinus trouble. Mouth and Throat: (-) sore throat, (-) hoarseness. Neck: (-) swollen glands, (-) enlarged thyroid, (-) neck pain. Respiratory: - cough, (-) bloody sputum, + shortness of breath, - wheezing. Cardiovascular: - ankle swelling, (-) chest pain. Lymphatic: (-) lymph node enlargement. Neurologic: (-) numbness, (-) tingling. Psychiatric: (-) anxiety, (-) depression   Current Medication: Outpatient Encounter Medications as of 08/22/2022  Medication Sig   celecoxib (CELEBREX) 200 MG  capsule Take 1 capsule (200 mg total) by mouth 2 (two) times daily.   cholecalciferol (VITAMIN D) 400 UNITS TABS tablet Take 400 Units by mouth daily.   Cyanocobalamin (B-12) 2500 MCG TABS Take 2,500 mcg by mouth daily.   FLUoxetine (PROZAC) 20 MG capsule Take 1 capsule (20 mg total) by mouth daily.   gabapentin (NEURONTIN) 100 MG capsule Take 100 mg by mouth once.   GLUCOSAMINE-CHONDROITIN PO Take 1 tablet by mouth 2 (two) times daily.   losartan (COZAAR) 25 MG tablet Take 1 tablet (25 mg total) by mouth daily.   Misc. Devices MISC cpap   Multiple Vitamins-Minerals (MULTIVITAMIN WITH MINERALS) tablet Take 1 tablet by mouth daily. Reported on 09/11/2015   Probiotic Product (PROBIOTIC-10 PO) Take 1 capsule by mouth daily.   Pyridoxine HCl (B-6) 100 MG TABS Take 100 mg by mouth daily.   rosuvastatin (CRESTOR) 5 MG tablet Take 1 tablet (5 mg total) by mouth daily.   terazosin (HYTRIN) 10 MG capsule Take 1 capsule (10 mg total) by mouth at bedtime.   enoxaparin (LOVENOX) 40 MG/0.4ML injection Inject 0.4 mLs (40 mg total) into the skin daily for 14 days.   No facility-administered encounter medications on file as of 08/22/2022.    Surgical History: Past Surgical History:  Procedure Laterality Date   COLONOSCOPY WITH PROPOFOL N/A 05/11/2021   Procedure: COLONOSCOPY WITH PROPOFOL;  Surgeon: Jonathon Bellows, MD;  Location: [facility] ENDOSCOPY;  Service: Gastroenterology;  Laterality: N/A;   HAND SURGERY Bilateral    HERNIA REPAIR     x 2   KNEE ARTHROPLASTY Left 11/22/2021   Procedure: COMPUTER  ASSISTED TOTAL KNEE ARTHROPLASTY;  Surgeon: Dereck Leep, MD;  Location: [facility] ORS;  Service: Orthopedics;  Laterality: Left;   NOSE SURGERY     REPLACEMENT TOTAL KNEE Right    REPLACEMENT TOTAL KNEE Left 11/22/2021    Medical History: Past Medical History:  Diagnosis Date   Anxiety and depression    Arthritis    Complication of anesthesia    woke up during surgery   Frequency    Gallstones 03/05/2013    Headache    Hypertension    Kidney problem 11/2011   Mixed hyperlipidemia    OAB (overactive bladder)    Obstructive sleep apnea (adult) (pediatric)    Pneumonia     Family History: Family History  Problem Relation Age of Onset   COPD Mother    Heart disease Mother    Cancer Father        Brain tumor   Lung cancer Paternal Uncle    Cancer - Other Maternal Grandmother        Back   Kidney disease Neg Hx    Prostate cancer Neg Hx     Social History: Social History   Socioeconomic History   Marital status: Married    Spouse name: Jimmy Norton   Number of children: Not on file   Years of education: Not on file   Highest education level: Not on file  Occupational History   Not on file  Tobacco Use   Smoking status: Some Days    Types: Cigars   Smokeless tobacco: Current    Types: Chew   Tobacco comments:    smokes cigars onces in a while  Vaping Use   Vaping Use: Never used  Substance and Sexual Activity   Alcohol use: No    Alcohol/week: 0.0 standard drinks of alcohol   Drug use: No   Sexual activity: Not on file  Other Topics Concern   Not on file  Social History Narrative   Live with wife.   Social Determinants of Health   Financial Resource Strain: Low Risk  (06/11/2021)   Overall Financial Resource Strain (CARDIA)    Difficulty of Paying Living Expenses: Not hard at all  Food Insecurity: Not on file  Transportation Needs: Not on file  Physical Activity: Not on file  Stress: Not on file  Social Connections: Not on file  Intimate Partner Violence: Not on file    Vital Signs: Blood pressure (!) 147/64, pulse 78, temperature 98.1 F (36.7 C), resp. rate 16, height '5\' 9"'$  (1.753 m), weight 299 lb (135.6 kg), SpO2 94 %.  Examination: General Appearance: The patient is well-developed, well-nourished, and in no distress. Skin: Gross inspection of skin unremarkable. Head: normocephalic, no gross deformities. Eyes: no gross deformities noted. ENT: ears  appear grossly normal no exudates. Neck: Supple. No thyromegaly. No LAD. Respiratory: Lungs clear to auscultation bilaterally. Cardiovascular: Normal S1 and S2 without murmur or rub. Extremities: No cyanosis. pulses are equal. Neurologic: Alert and oriented. No involuntary movements.  LABS: No results found for this or any previous visit (from the past 2160 hour(s)).  Radiology: DG Knee Left Port  Result Date: 11/22/2021 CLINICAL DATA:  Post LEFT total knee arthroplasty EXAM: PORTABLE LEFT KNEE - 1-2 VIEW COMPARISON:  Portable exam 1155 hours without priors for comparison FINDINGS: LEFT knee prosthesis identified. Mild osseous demineralization. No acute fracture, dislocation, or bone destruction. Surgical drains and skin clips anteriorly. IMPRESSION: LEFT knee prosthesis without acute complication. Electronically Signed   By: Lavonia Dana  M.D.   On: 11/22/2021 12:02    No results found.  No results found.    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   Age-related nuclear cataract, bilateral 08/29/2021   Noncompliance with medication regimen 12/15/2020   Syncope 04/15/2020   Abrasion of left forearm 04/15/2020   Abrasion of right lower leg 04/15/2020   Laceration of left forearm without complication 74/06/8785   Encounter for general adult medical examination with abnormal findings 08/27/2019   Left knee pain 08/27/2019   Cervical stenosis of spinal canal 11/06/2018   Screening for prostate cancer 07/30/2018   Dysuria 07/30/2018   Arthritis of carpometacarpal ([place]) joint of right thumb 07/02/2018   Primary  generalized (osteo)arthritis 06/10/2018   Cervical disc disease with myelopathy 06/10/2018   Anxiety 06/08/2018   Depression 06/08/2018   BPH (benign prostatic hyperplasia) 06/08/2018   Neuropathy 06/08/2018   Morbid obesity with body mass index (BMI) of 40.0 to 44.9 in adult ([place]) 11/30/2017   Tick bite 11/27/2017   Plantar fasciitis 11/27/2017   Primary osteoarthritis of right elbow 11/27/2017   OSA on CPAP 08/24/2017   Mixed hyperlipidemia 07/28/2017   Allergic rhinitis due to pollen 07/28/2017   Hypoxemia 07/28/2017   Calculus of kidney 07/28/2017   Chest pain, unspecified 07/28/2017   Major depressive disorder, recurrent, moderate ([place]) 07/28/2017   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 OSA on CPAP Continue nightly use  2. CPAP use counseling CPAP couseling-Discussed importance of adequate CPAP use as well as proper care and cleaning techniques of machine and all supplies.  3. Restrictive lung disease Recommend CT chest for further evaluation, [facility]  4. SOBOE (shortness of breath on exertion) Will discuss CT [facility]  5. Morbid obesity with BMI of 40.0-44.9, adult ([place]) Obesity Counseling: Had a lengthy discussion regarding patients BMI and weight issues. Patient was instructed on portion control as well as increased activity. Also discussed caloric restrictions with trying to  maintain intake less than 2000 Kcal. Discussions were made in accordance with the 5As of weight management. Simple actions such as not eating late and if able to, taking a walk is suggested.     General Counseling: I have discussed the findings of the evaluation and examination with Jimmy Norton.  I have also discussed any further diagnostic evaluation thatmay be needed or ordered today. Tanush verbalizes understanding of the findings of todays visit. We also reviewed his medications today and discussed drug interactions and side effects including but not limited excessive drowsiness and altered mental states. We also discussed that there is always a  risk not just to him but also people around him. he has been encouraged to call the office with any questions or concerns that should arise related to todays visit.  No orders of the defined types were placed in this encounter.    Time spent: 30  I have personally obtained a history, examined the patient, evaluated laboratory and imaging results, formulated the assessment and plan and placed orders. This patient was seen by Drema Dallas, PA-C in collaboration with Dr. Devona Konig as a part of collaborative care agreement.     Allyne Gee, MD [facility] Pulmonary and Critical Care Sleep medicine

## 2022-08-24 ENCOUNTER — Telehealth: Payer: Self-pay | Admitting: Internal Medicine

## 2022-08-24 NOTE — Telephone Encounter (Signed)
Left vm to confirm 08/31/2022 appointment-Toni

## 2022-08-31 ENCOUNTER — Ambulatory Visit (INDEPENDENT_AMBULATORY_CARE_PROVIDER_SITE_OTHER): Payer: Medicare HMO

## 2022-08-31 DIAGNOSIS — G4733 Obstructive sleep apnea (adult) (pediatric): Secondary | ICD-10-CM | POA: Diagnosis not present

## 2022-08-31 NOTE — Progress Notes (Unsigned)
95 percentile pressure 11.3   95th percentile leak 27.3   apnea index 0.5 /hr  apnea-hypopnea index  1.1 /hr   total days used  >4 hr 87 days  total days used <4 hr 3 days  Total compliance 97 percent  He is doing very good. No problems or questions at this time. [facility]    Pt was seen by Claiborne Billings  RRT/RCP  from [facility]

## 2022-09-08 DIAGNOSIS — G4733 Obstructive sleep apnea (adult) (pediatric): Secondary | ICD-10-CM | POA: Diagnosis not present

## 2022-09-08 DIAGNOSIS — J9601 Acute respiratory failure with hypoxia: Secondary | ICD-10-CM | POA: Diagnosis not present

## 2022-09-15 ENCOUNTER — Ambulatory Visit: Payer: Medicare HMO | Admitting: Nurse Practitioner

## 2022-09-26 ENCOUNTER — Other Ambulatory Visit: Payer: Self-pay | Admitting: Physician Assistant

## 2022-09-26 ENCOUNTER — Telehealth: Payer: Self-pay | Admitting: Nurse Practitioner

## 2022-09-26 ENCOUNTER — Telehealth: Payer: Self-pay | Admitting: Physician Assistant

## 2022-09-26 ENCOUNTER — Encounter: Payer: Self-pay | Admitting: Nurse Practitioner

## 2022-09-26 ENCOUNTER — Ambulatory Visit (INDEPENDENT_AMBULATORY_CARE_PROVIDER_SITE_OTHER): Payer: Medicare HMO | Admitting: Nurse Practitioner

## 2022-09-26 VITALS — BP 139/87 | HR 80 | Temp 96.6°F | Resp 16 | Ht 69.0 in | Wt 298.4 lb

## 2022-09-26 DIAGNOSIS — N3281 Overactive bladder: Secondary | ICD-10-CM

## 2022-09-26 DIAGNOSIS — R0602 Shortness of breath: Secondary | ICD-10-CM

## 2022-09-26 DIAGNOSIS — M150 Primary generalized (osteo)arthritis: Secondary | ICD-10-CM | POA: Diagnosis not present

## 2022-09-26 DIAGNOSIS — G4733 Obstructive sleep apnea (adult) (pediatric): Secondary | ICD-10-CM | POA: Diagnosis not present

## 2022-09-26 DIAGNOSIS — Z0001 Encounter for general adult medical examination with abnormal findings: Secondary | ICD-10-CM | POA: Diagnosis not present

## 2022-09-26 DIAGNOSIS — I10 Essential (primary) hypertension: Secondary | ICD-10-CM

## 2022-09-26 DIAGNOSIS — J984 Other disorders of lung: Secondary | ICD-10-CM

## 2022-09-26 NOTE — Telephone Encounter (Signed)
Lvm regarding CT and to schedule follow up-Toni

## 2022-09-26 NOTE — Progress Notes (Signed)
[facility] [place],  [address]  Internal MEDICINE  Office Visit Note  Patient Name: Jimmy Norton  [id number]  [id number]  Date of Service: 09/26/2022  Chief Complaint  Patient presents with   Depression   Hyperlipidemia   Hypertension   Medicare Wellness    HPI Jimmy Norton presents for an annual well visit and physical exam.  Well-appearing 77 y.o. male with CHF, chronic back and neck pain, hypertension, OSA, allergic rhinitis, overactive bladder, and high cholesterol.  [facility] or [facility].  Routine CRC screening: will [facility]: gets labs done at [facility] or worsening pain: chronic back pain, knee pain. Chronic neck pain/shoulder pain bilaterally. Other concerns: getting most of care at the [facility].        09/26/2022    9:06 AM 09/23/2021    9:26 AM 09/21/2020    9:58 AM  MMSE - Mini Mental State Exam  Orientation to time '5 5 5  '$ Orientation to Place '5 5 5  '$ Registration '3 3 3  '$ Attention/ Calculation '5 5 5  '$ Recall '3 3 3  '$ Language- name 2 objects '2 2 2  '$ Language- repeat '1 1 1  '$ Language- follow 3 step command '3 3 3  '$ Language- read & follow direction '1 1 1  '$ Write a sentence '1 1 1  '$ Copy design '1 1 1  '$ Total score '30 30 30    '$ Functional Status Survey: Is the patient deaf or have difficulty hearing?: Yes Does the patient have difficulty seeing, even when wearing glasses/contacts?: Yes Does the patient have difficulty concentrating, remembering, or making decisions?: Yes Does the patient have difficulty walking or climbing stairs?: Yes Does the patient have difficulty dressing or bathing?: No Does the patient have difficulty doing errands alone such as visiting a doctor's office or shopping?: No     11/23/2021   10:00 AM 11/23/2021   12:00 PM 12/21/2021    9:13 AM 04/25/2022    8:29 AM 09/26/2022    9:04 AM  Fall Risk  Falls in the past year?   0 0 0  Was there an injury with Fall?    0 0  Fall Risk Category  Calculator    0 0  Fall Risk Category (Retired)    Low   (RETIRED) Patient Fall Risk Level Low fall risk Low fall risk Low fall risk Low fall risk   Patient at Risk for Falls Due to   No Fall Risks No Fall Risks No Fall Risks  Fall risk Follow up   Falls evaluation completed Falls evaluation completed Falls evaluation completed       12/21/2021    9:13 AM  Depression screen PHQ 2/9  Decreased Interest 0  Down, Depressed, Hopeless 0  PHQ - 2 Score 0       Current Medication: Outpatient Encounter Medications as of 09/26/2022  Medication Sig   cholecalciferol (VITAMIN D) 400 UNITS TABS tablet Take 400 Units by mouth daily.   Cyanocobalamin (B-12) 2500 MCG TABS Take 2,500 mcg by mouth daily.   FLUoxetine (PROZAC) 20 MG capsule Take 1 capsule (20 mg total) by mouth daily.   GLUCOSAMINE-CHONDROITIN PO Take 1 tablet by mouth 2 (two) times daily.   losartan (COZAAR) 25 MG tablet Take 1 tablet (25 mg total) by mouth daily.   Misc. Devices MISC cpap   Multiple Vitamins-Minerals (MULTIVITAMIN WITH MINERALS) tablet Take 1 tablet by mouth daily. Reported on  09/11/2015   oxybutynin (DITROPAN-XL) 10 MG 24 hr tablet TAKE ONE TABLET BY MOUTH EVERY DAY FOR URINARY FREQUENCY   Probiotic Product (PROBIOTIC-10 PO) Take 1 capsule by mouth daily.   Pyridoxine HCl (B-6) 100 MG TABS Take 100 mg by mouth daily.   rosuvastatin (CRESTOR) 5 MG tablet Take 1 tablet (5 mg total) by mouth daily.   terazosin (HYTRIN) 10 MG capsule Take 1 capsule (10 mg total) by mouth at bedtime.   [DISCONTINUED] celecoxib (CELEBREX) 200 MG capsule Take 1 capsule (200 mg total) by mouth 2 (two) times daily.   [DISCONTINUED] gabapentin (NEURONTIN) 100 MG capsule Take 100 mg by mouth once.   [DISCONTINUED] enoxaparin (LOVENOX) 40 MG/0.4ML injection Inject 0.4 mLs (40 mg total) into the skin daily for 14 days.   No facility-administered encounter medications on file as of 09/26/2022.    Surgical History: Past Surgical History:   Procedure Laterality Date   COLONOSCOPY WITH PROPOFOL N/A 05/11/2021   Procedure: COLONOSCOPY WITH PROPOFOL;  Surgeon: Jonathon Bellows, MD;  Location: [facility] ENDOSCOPY;  Service: Gastroenterology;  Laterality: N/A;   HAND SURGERY Bilateral    HERNIA REPAIR     x 2   KNEE ARTHROPLASTY Left 11/22/2021   Procedure: COMPUTER ASSISTED TOTAL KNEE ARTHROPLASTY;  Surgeon: Dereck Leep, MD;  Location: [facility] ORS;  Service: Orthopedics;  Laterality: Left;   NOSE SURGERY     REPLACEMENT TOTAL KNEE Right    REPLACEMENT TOTAL KNEE Left 11/22/2021    Medical History: Past Medical History:  Diagnosis Date   Anxiety and depression    Arthritis    Complication of anesthesia    woke up during surgery   Frequency    Gallstones 03/05/2013   Headache    Hypertension    Kidney problem 11/2011   Mixed hyperlipidemia    OAB (overactive bladder)    Obstructive sleep apnea (adult) (pediatric)    Pneumonia     Family History: Family History  Problem Relation Age of Onset   COPD Mother    Heart disease Mother    Cancer Father        Brain tumor   Lung cancer Paternal Uncle    Cancer - Other Maternal Grandmother        Back   Kidney disease Neg Hx    Prostate cancer Neg Hx     Social History   Socioeconomic History   Marital status: Married    Spouse name: Margaretha Sheffield   Number of children: Not on file   Years of education: Not on file   Highest education level: Not on file  Occupational History   Not on file  Tobacco Use   Smoking status: Some Days    Types: Cigars   Smokeless tobacco: Current    Types: Chew   Tobacco comments:    smokes cigars onces in a while  Vaping Use   Vaping Use: Never used  Substance and Sexual Activity   Alcohol use: No    Alcohol/week: 0.0 standard drinks of alcohol   Drug use: No   Sexual activity: Not on file  Other Topics Concern   Not on file  Social History Narrative   Live with wife.   Social Determinants of Health   Financial Resource Strain:  Low Risk  (06/11/2021)   Overall Financial Resource Strain (CARDIA)    Difficulty of Paying Living Expenses: Not hard at all  Food Insecurity: Not on file  Transportation Needs: Not on file  Physical Activity: Not  on file  Stress: Not on file  Social Connections: Not on file  Intimate Partner Violence: Not on file      Review of Systems  Constitutional:  Negative for activity change, appetite change, chills, fatigue, fever and unexpected weight change.  HENT: Negative.  Negative for congestion, ear pain, rhinorrhea, sore throat and trouble swallowing.   Eyes: Negative.   Respiratory:  Positive for shortness of breath (with minimal exertion). Negative for cough, chest tightness and wheezing.   Cardiovascular: Negative.  Negative for chest pain and palpitations.  Gastrointestinal: Negative.  Negative for abdominal pain, blood in stool, constipation, diarrhea, nausea and vomiting.  Endocrine: Negative.   Genitourinary: Negative.  Negative for difficulty urinating, dysuria, frequency, hematuria and urgency.  Musculoskeletal:  Positive for arthralgias, back pain and neck pain. Negative for joint swelling and myalgias.  Skin: Negative.  Negative for rash and wound.  Allergic/Immunologic: Negative.  Negative for immunocompromised state.  Neurological: Negative.  Negative for dizziness, seizures, numbness and headaches.  Hematological: Negative.   Psychiatric/Behavioral: Negative.  Negative for behavioral problems, self-injury, sleep disturbance and suicidal ideas. The patient is not nervous/anxious.     Vital Signs: BP 139/87   Pulse 80   Temp (!) 96.6 F (35.9 C)   Resp 16   Ht '5\' 9"'$  (1.753 m)   Wt 298 lb 6.4 oz (135.4 kg)   SpO2 95%   BMI 44.07 kg/m    Physical Exam Vitals reviewed.  Constitutional:      General: He is awake. He is not in acute distress.    Appearance: Normal appearance. He is well-developed and well-groomed. He is morbidly obese. He is not ill-appearing or  diaphoretic.  HENT:     Head: Normocephalic and atraumatic.     Right Ear: Tympanic membrane, ear canal and external ear normal.     Left Ear: Tympanic membrane, ear canal and external ear normal.     Ears:     Comments: Wears bilateral hearing aids    Nose: Nose normal. No congestion or rhinorrhea.     Mouth/Throat:     Lips: Pink.     Mouth: Mucous membranes are moist.     Pharynx: Oropharynx is clear. Uvula midline. No oropharyngeal exudate or posterior oropharyngeal erythema.  Eyes:     General: Lids are normal. Vision grossly intact. Gaze aligned appropriately. No scleral icterus.       Right eye: No discharge.        Left eye: No discharge.     Extraocular Movements: Extraocular movements intact.     Conjunctiva/sclera: Conjunctivae normal.     Pupils: Pupils are equal, round, and reactive to light.     Funduscopic exam:    Right eye: Red reflex present.        Left eye: Red reflex present. Neck:     Thyroid: No thyromegaly.     Vascular: No JVD.     Trachea: Trachea and phonation normal. No tracheal deviation.  Cardiovascular:     Rate and Rhythm: Normal rate and regular rhythm.     Pulses: Normal pulses.     Heart sounds: Normal heart sounds, S1 normal and S2 normal. No murmur heard.    No friction rub. No gallop.  Pulmonary:     Effort: Pulmonary effort is normal. No accessory muscle usage or respiratory distress.     Breath sounds: Normal breath sounds and air entry. No stridor. No wheezing or rales.  Chest:     Chest wall:  No tenderness.  Abdominal:     General: Bowel sounds are normal. There is no distension.     Palpations: Abdomen is soft. There is no mass.     Tenderness: There is no abdominal tenderness. There is no guarding or rebound.  Musculoskeletal:        General: No tenderness or deformity. Normal range of motion.     Cervical back: Normal range of motion and neck supple.     Right lower leg: 1+ Pitting Edema present.     Left lower leg: 1+ Pitting  Edema present.  Lymphadenopathy:     Cervical: No cervical adenopathy.  Skin:    General: Skin is warm and dry.     Capillary Refill: Capillary refill takes less than 2 seconds.     Coloration: Skin is not pale.     Findings: No erythema or rash.  Neurological:     Mental Status: He is alert and oriented to person, place, and time.     Cranial Nerves: No cranial nerve deficit.     Motor: No abnormal muscle tone.     Coordination: Coordination normal.     Gait: Gait normal.     Deep Tendon Reflexes: Reflexes are normal and symmetric.  Psychiatric:        Mood and Affect: Mood normal.        Behavior: Behavior is cooperative.        Thought Content: Thought content normal.        Judgment: Judgment normal.        Assessment/Plan: 1. Encounter for routine adult health examination with abnormal findings Age-appropriate preventive screenings and vaccinations discussed, annual physical exam completed. Routine labs for health maintenance done at the [place] last month. PHM updated.   2. OSA on CPAP Uses CPAP as instructed, no issues  3. Primary generalized (osteo)arthritis Stable, had total knee replacements over the past 2 years, takes glucosamine-chondroitin.   4. Essential (primary) hypertension Continue losartan as prescribed.   5. OAB (overactive bladder) Continue oxybutynin and terazosin as prescribed     General Counseling: Becky verbalizes understanding of the findings of todays visit and agrees with plan of treatment. I have discussed any further diagnostic evaluation that may be needed or ordered today. We also reviewed his medications today. he has been encouraged to call the office with any questions or concerns that should arise related to todays visit.    No orders of the defined types were placed in this encounter.   No orders of the defined types were placed in this encounter.   Return in about 1 year (around 09/26/2023) for AWV, [place] PCP and otherwise as  needed, also goes to [facility] regularly .   Total time spent:30 Minutes Time spent includes review of chart, medications, test results, and follow up plan with the patient.   [place] Controlled Substance Database was reviewed by me.  This patient was seen by Jonetta Osgood, FNP-C in collaboration with Dr. Clayborn Bigness as a part of collaborative care agreement.  Juron Vorhees R. Valetta Fuller, MSN, FNP-C Internal medicine

## 2022-09-27 ENCOUNTER — Encounter: Payer: Self-pay | Admitting: Nurse Practitioner

## 2022-10-04 ENCOUNTER — Ambulatory Visit
Admission: RE | Admit: 2022-10-04 | Discharge: 2022-10-04 | Disposition: A | Discharge status: Home / Self Care | Payer: Medicare HMO | Source: Ambulatory Visit | Attending: Physician Assistant | Admitting: Physician Assistant

## 2022-10-04 DIAGNOSIS — J984 Other disorders of lung: Secondary | ICD-10-CM | POA: Insufficient documentation

## 2022-10-04 DIAGNOSIS — R0602 Shortness of breath: Secondary | ICD-10-CM | POA: Diagnosis not present

## 2022-10-04 NOTE — Telephone Encounter (Signed)
Error

## 2022-10-07 ENCOUNTER — Telehealth: Payer: Self-pay | Admitting: Nurse Practitioner

## 2022-10-07 DIAGNOSIS — G4733 Obstructive sleep apnea (adult) (pediatric): Secondary | ICD-10-CM | POA: Diagnosis not present

## 2022-10-07 DIAGNOSIS — J9601 Acute respiratory failure with hypoxia: Secondary | ICD-10-CM | POA: Diagnosis not present

## 2022-10-07 NOTE — Telephone Encounter (Signed)
Per patient's request, chest CT results faxed to Dr. Hassell Done w/ [facility]; [phone]

## 2022-10-13 ENCOUNTER — Ambulatory Visit: Payer: Medicare HMO | Admitting: Internal Medicine

## 2022-10-13 ENCOUNTER — Encounter: Payer: Self-pay | Admitting: Internal Medicine

## 2022-10-13 VITALS — BP 130/70 | HR 70 | Temp 97.8°F | Resp 16 | Ht 69.0 in | Wt 298.0 lb

## 2022-10-13 DIAGNOSIS — G4733 Obstructive sleep apnea (adult) (pediatric): Secondary | ICD-10-CM | POA: Diagnosis not present

## 2022-10-13 DIAGNOSIS — J4489 Other specified chronic obstructive pulmonary disease: Secondary | ICD-10-CM | POA: Diagnosis not present

## 2022-10-13 DIAGNOSIS — J84112 Idiopathic pulmonary fibrosis: Secondary | ICD-10-CM

## 2022-10-13 NOTE — Progress Notes (Signed)
[facility] [place], [place] [address]  Pulmonary Sleep Medicine   Office Visit Note  Patient Name: Jimmy Norton. DOB: March 28, 1946 MRN [id number]  Date of Service: 10/13/2022  Complaints/HPI: He states he is going to the [place] now for his care. He states he is going there for PCP. Patient states his breathing has been doing OK. Denies having cough and chest pain at this time. Last PFT done shows FEV1 of 82% Doing well with CPAP. 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 He needs to be evaluated by the [facility] for this further if it is service connected  ROS  General: (-) fever, (-) chills, (-) night sweats, (-) weakness Skin: (-) rashes, (-) itching,. Eyes: (-) visual changes, (-) redness, (-) itching. Nose and Sinuses: (-) nasal stuffiness or itchiness, (-) postnasal drip, (-) nosebleeds, (-) sinus trouble. Mouth and Throat: (-) sore throat, (-) hoarseness. Neck: (-) swollen glands, (-) enlarged thyroid, (-) neck pain. Respiratory: - cough, (-) bloody sputum, + shortness of breath, - wheezing. Cardiovascular: - ankle swelling, (-) chest pain. Lymphatic: (-) lymph node enlargement. Neurologic: (-) numbness, (-) tingling. Psychiatric: (-) anxiety, (-) depression   Current Medication: Outpatient Encounter Medications as of 10/13/2022  Medication Sig   cholecalciferol (VITAMIN D) 400 UNITS TABS tablet Take 400 Units by mouth daily.   Cyanocobalamin (B-12) 2500 MCG TABS Take 2,500 mcg by mouth daily.   FLUoxetine (PROZAC) 20 MG capsule Take 1 capsule (20 mg total) by mouth daily.   GLUCOSAMINE-CHONDROITIN PO Take 1 tablet by mouth 2 (two) times daily.   losartan (COZAAR) 25 MG tablet Take 1 tablet (25 mg total) by mouth daily.   Misc. Devices MISC cpap   Multiple Vitamins-Minerals (MULTIVITAMIN WITH MINERALS) tablet Take 1 tablet by mouth daily. Reported on 09/11/2015   oxybutynin (DITROPAN-XL) 10 MG 24 hr tablet TAKE ONE TABLET BY MOUTH EVERY DAY FOR  URINARY FREQUENCY   Probiotic Product (PROBIOTIC-10 PO) Take 1 capsule by mouth daily.   Pyridoxine HCl (B-6) 100 MG TABS Take 100 mg by mouth daily.   rosuvastatin (CRESTOR) 5 MG tablet Take 1 tablet (5 mg total) by mouth daily.   terazosin (HYTRIN) 10 MG capsule Take 1 capsule (10 mg total) by mouth at bedtime.   No facility-administered encounter medications on file as of 10/13/2022.    Surgical History: Past Surgical History:  Procedure Laterality Date   COLONOSCOPY WITH PROPOFOL N/A 05/11/2021   Procedure: COLONOSCOPY WITH PROPOFOL;  Surgeon: Jonathon Bellows, MD;  Location: [facility] ENDOSCOPY;  Service: Gastroenterology;  Laterality: N/A;   HAND SURGERY Bilateral    HERNIA REPAIR     x 2   KNEE ARTHROPLASTY Left 11/22/2021   Procedure: COMPUTER ASSISTED TOTAL KNEE ARTHROPLASTY;  Surgeon: Dereck Leep, MD;  Location: [facility] ORS;  Service: Orthopedics;  Laterality: Left;   NOSE SURGERY     REPLACEMENT TOTAL KNEE Right    REPLACEMENT TOTAL KNEE Left 11/22/2021    Medical History: Past Medical History:  Diagnosis Date   Anxiety and depression    Arthritis    Complication of anesthesia    woke up during surgery   Frequency    Gallstones 03/05/2013   Headache    Hypertension    Kidney problem 11/2011   Mixed hyperlipidemia    OAB (overactive bladder)    Obstructive sleep apnea (adult) (pediatric)    Pneumonia     Family History: Family History  Problem Relation Age of Onset  COPD Mother    Heart disease Mother    Cancer Father        Brain tumor   Lung cancer Paternal Uncle    Cancer - Other Maternal Grandmother        Back   Kidney disease Neg Hx    Prostate cancer Neg Hx     Social History: Social History   Socioeconomic History   Marital status: Married    Spouse name: Margaretha Sheffield   Number of children: Not on file   Years of education: Not on file   Highest education level: Not on file  Occupational History   Not on file  Tobacco Use   Smoking status: Some  Days    Types: Cigars   Smokeless tobacco: Current    Types: Chew   Tobacco comments:    smokes cigars onces in a while  Vaping Use   Vaping Use: Never used  Substance and Sexual Activity   Alcohol use: No    Alcohol/week: 0.0 standard drinks of alcohol   Drug use: No   Sexual activity: Not on file  Other Topics Concern   Not on file  Social History Narrative   Live with wife.   Social Determinants of Health   Financial Resource Strain: Low Risk  (06/11/2021)   Overall Financial Resource Strain (CARDIA)    Difficulty of Paying Living Expenses: Not hard at all  Food Insecurity: Not on file  Transportation Needs: Not on file  Physical Activity: Not on file  Stress: Not on file  Social Connections: Not on file  Intimate Partner Violence: Not on file    Vital Signs: Blood pressure 130/70, pulse 70, temperature 97.8 F (36.6 C), resp. rate 16, height 5\' 9"  (1.753 m), weight 298 lb (135.2 kg), SpO2 94 %.  Examination: General Appearance: The patient is well-developed, well-nourished, and in no distress. Skin: Gross inspection of skin unremarkable. Head: normocephalic, no gross deformities. Eyes: no gross deformities noted. ENT: ears appear grossly normal no exudates. Neck: Supple. No thyromegaly. No LAD. Respiratory: no rhonchi noted. Cardiovascular: Normal S1 and S2 without murmur or rub. Extremities: No cyanosis. pulses are equal. Neurologic: Alert and oriented. No involuntary movements.  LABS: No results found for this or any previous visit (from the past 2160 hour(s)).  Radiology: CT Chest Wo Contrast  Result Date: 10/06/2022 CLINICAL DATA:  Shortness of breath, restrictive lung disease. EXAM: CT CHEST WITHOUT CONTRAST TECHNIQUE: Multidetector CT imaging of the chest was performed following the standard protocol without IV contrast. RADIATION DOSE REDUCTION: This exam was performed according to the departmental dose-optimization program which includes automated  exposure control, adjustment of the mA and/or kV according to patient size and/or use of iterative reconstruction technique. COMPARISON:  None Available. FINDINGS: Cardiovascular: Atherosclerotic calcification of the aorta, aortic valve and coronary arteries. Enlarged pulmonic trunk and heart. No pericardial effusion. Mediastinum/Nodes: Mediastinal lymph nodes measure up to 10 mm in the low right paratracheal station. Hilar regions are difficult to evaluate without IV contrast. No axillary adenopathy. Esophagus is grossly unremarkable. Lungs/Pleura: Peripheral and basilar predominant subpleural reticulation with traction bronchiectasis/bronchiolectasis and some interstitial and subpleural ground-glass. Image quality in the lung bases is degraded by respiratory motion. No pleural fluid. Airway is unremarkable. Upper Abdomen: Visualized portion of the liver is unremarkable. Stones in the gallbladder. Adrenal glands and visualized portion of the right kidney are unremarkable. Probable left renal cysts. No specific follow-up necessary. Visualized portions of the spleen, pancreas, stomach and bowel are grossly unremarkable.  No upper abdominal adenopathy. Musculoskeletal: Degenerative changes in the spine. No worrisome lytic or sclerotic lesions. Old rib fractures. IMPRESSION: 1. Pulmonary parenchymal pattern of fibrosis may be due to usual interstitial pneumonitis or fibrotic nonspecific interstitial pneumonitis. Future evaluation utilizing a high-resolution chest CT protocol is recommended. 2. Borderline mediastinal adenopathy, likely reactive in the setting of interstitial lung disease. 3. Cholelithiasis. 4. Aortic atherosclerosis (ICD10-I70.0). Coronary artery calcification. 5. Enlarged pulmonic trunk, indicative of pulmonary arterial hypertension. Electronically Signed   By: Lorin Picket M.D.   On: 10/06/2022 11:18    No results found.  CT Chest Wo Contrast  Result Date: 10/06/2022 CLINICAL DATA:  Shortness  of breath, restrictive lung disease. EXAM: CT CHEST WITHOUT CONTRAST TECHNIQUE: Multidetector CT imaging of the chest was performed following the standard protocol without IV contrast. RADIATION DOSE REDUCTION: This exam was performed according to the departmental dose-optimization program which includes automated exposure control, adjustment of the mA and/or kV according to patient size and/or use of iterative reconstruction technique. COMPARISON:  None Available. FINDINGS: Cardiovascular: Atherosclerotic calcification of the aorta, aortic valve and coronary arteries. Enlarged pulmonic trunk and heart. No pericardial effusion. Mediastinum/Nodes: Mediastinal lymph nodes measure up to 10 mm in the low right paratracheal station. Hilar regions are difficult to evaluate without IV contrast. No axillary adenopathy. Esophagus is grossly unremarkable. Lungs/Pleura: Peripheral and basilar predominant subpleural reticulation with traction bronchiectasis/bronchiolectasis and some interstitial and subpleural ground-glass. Image quality in the lung bases is degraded by respiratory motion. No pleural fluid. Airway is unremarkable. Upper Abdomen: Visualized portion of the liver is unremarkable. Stones in the gallbladder. Adrenal glands and visualized portion of the right kidney are unremarkable. Probable left renal cysts. No specific follow-up necessary. Visualized portions of the spleen, pancreas, stomach and bowel are grossly unremarkable. No upper abdominal adenopathy. Musculoskeletal: Degenerative changes in the spine. No worrisome lytic or sclerotic lesions. Old rib fractures. IMPRESSION: 1. Pulmonary parenchymal pattern of fibrosis may be due to usual interstitial pneumonitis or fibrotic nonspecific interstitial pneumonitis. Future evaluation utilizing a high-resolution chest CT protocol is recommended. 2. Borderline mediastinal adenopathy, likely reactive in the setting of interstitial lung disease. 3. Cholelithiasis. 4.  Aortic atherosclerosis (ICD10-I70.0). Coronary artery calcification. 5. Enlarged pulmonic trunk, indicative of pulmonary arterial hypertension. Electronically Signed   By: Lorin Picket M.D.   On: 10/06/2022 11:18      Assessment and Plan: Patient Active Problem List   Diagnosis Date Noted   Unspecified astigmatism, bilateral 11/22/2021   Balanitis 11/22/2021   Total knee replacement status 11/22/2021   Numbness and tingling in right hand 09/25/2021   Hardening of the aorta (main artery of the heart) ([place]) 08/29/2021   Exposure to potentially hazardous substance 08/29/2021   Bilateral sensorineural hearing loss 08/29/2021   Age-related nuclear cataract, bilateral 08/29/2021   Noncompliance with medication regimen 12/15/2020   Syncope 04/15/2020   Abrasion of left forearm 04/15/2020   Abrasion of right lower leg 04/15/2020   Laceration of left forearm without complication [id number]   Encounter for general adult medical examination with abnormal findings 08/27/2019   Left knee pain 08/27/2019   Cervical stenosis of spinal canal 11/06/2018   Screening for prostate cancer 07/30/2018   Dysuria 07/30/2018   Arthritis of carpometacarpal [facility]) joint of right thumb 07/02/2018   Primary generalized (osteo)arthritis 06/10/2018   Cervical disc disease with myelopathy 06/10/2018   Anxiety 06/08/2018   Depression 06/08/2018   BPH (benign prostatic hyperplasia) 06/08/2018   Neuropathy 06/08/2018   Morbid obesity with body mass  index (BMI) of 40.0 to 44.9 in adult [facility]) 11/30/2017   Tick bite 11/27/2017   Plantar fasciitis 11/27/2017   Primary osteoarthritis of right elbow 11/27/2017   OSA on CPAP 08/24/2017   Mixed hyperlipidemia 07/28/2017   Allergic rhinitis due to pollen 07/28/2017   Hypoxemia 07/28/2017   Calculus of kidney 07/28/2017   Chest pain, unspecified 07/28/2017   Major depressive disorder, recurrent, moderate ([place]) 07/28/2017   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 OSA (obstructive sleep apnea) On his PAP therapy at this time. Patient states he has been trying to be compliant with therapy  2. Obesity, morbid ([place]) Obesity Counseling: Had a lengthy discussion regarding patients BMI and weight issues. Patient was instructed on portion control as well as increased activity. Also discussed caloric restrictions with trying to maintain intake less than 2000 Kcal. Discussions were made in accordance with the 5As of weight management. Simple actions such as not eating late and if able to, taking a walk is suggested.   3. Obstructive chronic bronchitis without exacerbation Not on inhalers right now states he does not need them and his FEV1 is good too  4. UIP (usual interstitial pneumonitis) (HCC) Thhis needs to be worked up further would followup with the [place]: I have discussed the findings of the evaluation and examination with Thierno.  I have also discussed any further diagnostic evaluation thatmay be needed or ordered today. Brookes verbalizes understanding of the findings of todays visit. We also reviewed his medications today and discussed drug interactions and side effects including but not limited excessive drowsiness and altered mental states. We also discussed that there is always a risk not just to him but also people around him. he has been encouraged to call the office with any questions or concerns that should arise related to todays visit.  No  orders of the defined types were placed in this encounter.    Time spent: 45  I have personally obtained a history, examined the patient, evaluated laboratory and imaging results, formulated the assessment and plan and placed orders.    Allyne Gee, MD [facility] Pulmonary and Critical Care Sleep medicine

## 2022-10-21 ENCOUNTER — Other Ambulatory Visit: Payer: Self-pay | Admitting: Nurse Practitioner

## 2022-10-21 DIAGNOSIS — I700 Atherosclerosis of aorta: Secondary | ICD-10-CM

## 2022-10-26 ENCOUNTER — Ambulatory Visit (INDEPENDENT_AMBULATORY_CARE_PROVIDER_SITE_OTHER): Payer: Medicare HMO

## 2022-10-26 DIAGNOSIS — G4733 Obstructive sleep apnea (adult) (pediatric): Secondary | ICD-10-CM

## 2022-10-26 NOTE — Progress Notes (Signed)
95 percentile pressure 11.7   95th percentile leak 23.6   apnea index 0.5 /hr  apnea-hypopnea index  1.2 /hr   total days used  >4 hr 82 days  total days used <4 hr 5 days  Total compliance 97 percent  He is doing great no problems or questions at this time. Pt was seen by Claiborne Billings  RRT/RCP  from [facility]

## 2022-11-02 ENCOUNTER — Ambulatory Visit: Payer: Medicare HMO | Admitting: Internal Medicine

## 2022-11-07 DIAGNOSIS — J9601 Acute respiratory failure with hypoxia: Secondary | ICD-10-CM | POA: Diagnosis not present

## 2022-11-07 DIAGNOSIS — G4733 Obstructive sleep apnea (adult) (pediatric): Secondary | ICD-10-CM | POA: Diagnosis not present

## 2022-11-28 IMAGING — CT CT HEAD W/O CM
3 series · 15 of 47 positions shown, 18 images · non-contrast
Comparison: 01/02/2006

CLINICAL DATA: Dizziness, vision disturbances.  Recent head trauma

EXAM:
CT HEAD WITHOUT CONTRAST
TECHNIQUE: Contiguous axial images were obtained from the base of the skull
through the vertex without intravenous contrast.

[Series 3: head wo · axial · 0.43mm/px · z∈[+211,+336]mm · 9 of 31 slices shown, 12 images]
[im 3/31  brain]
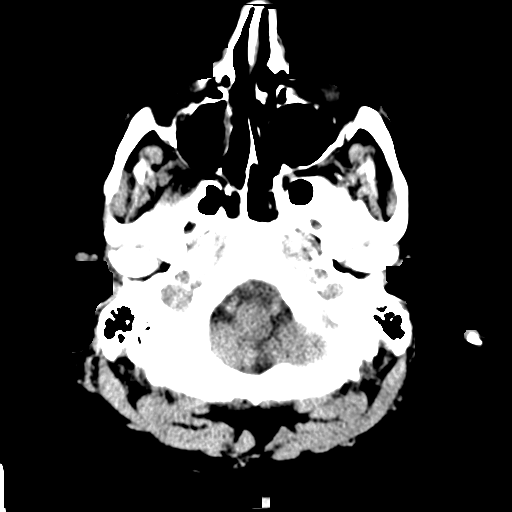
[im 3/31  bone]
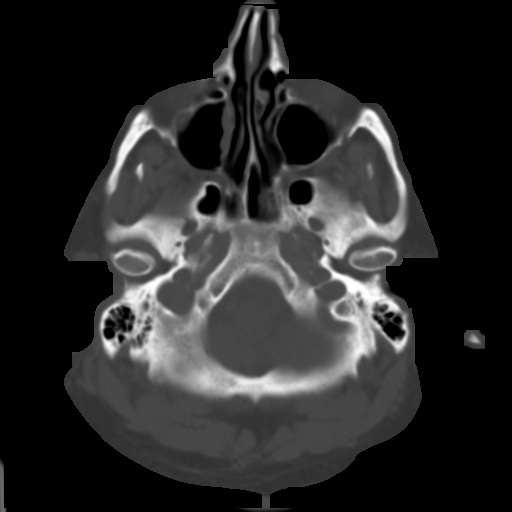
[im 6/31  brain]
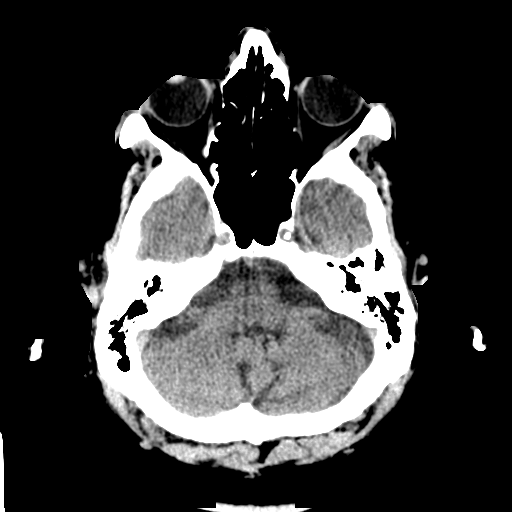
[im 9/31  brain]
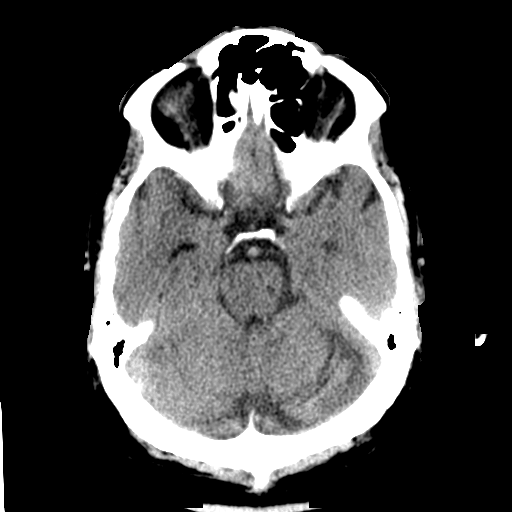
[im 12/31  brain]
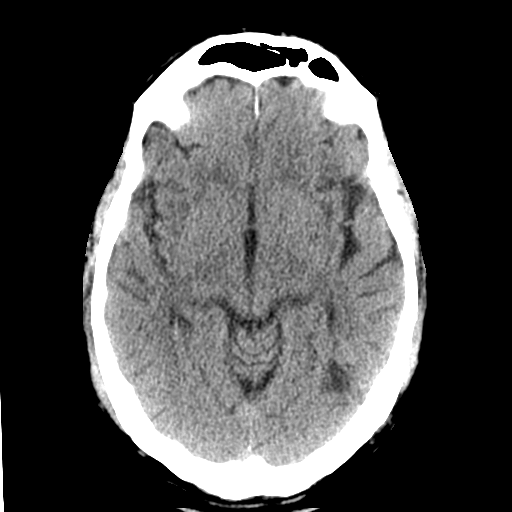
[im 16/31  brain]
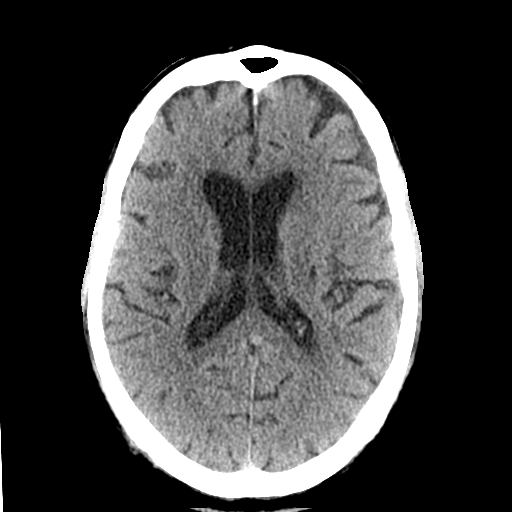
[im 16/31  bone]
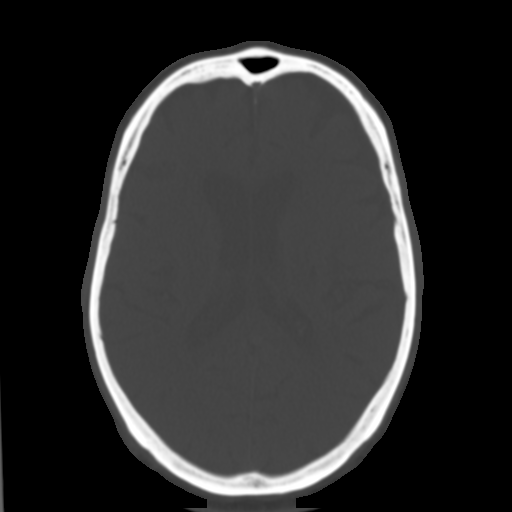
[im 19/31  brain]
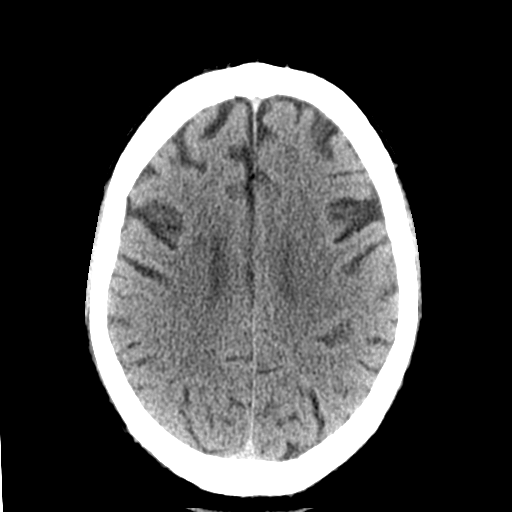
[im 22/31  brain]
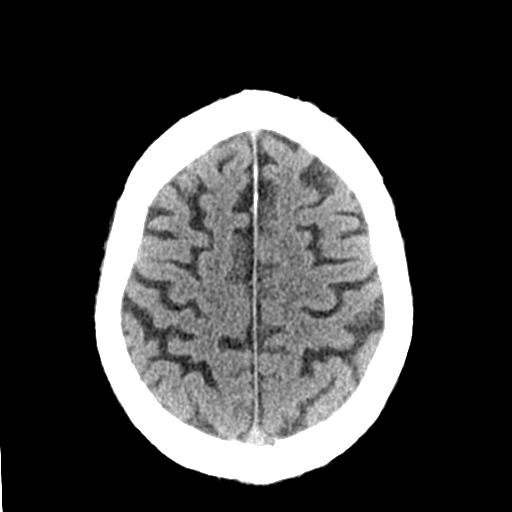
[im 25/31  brain]
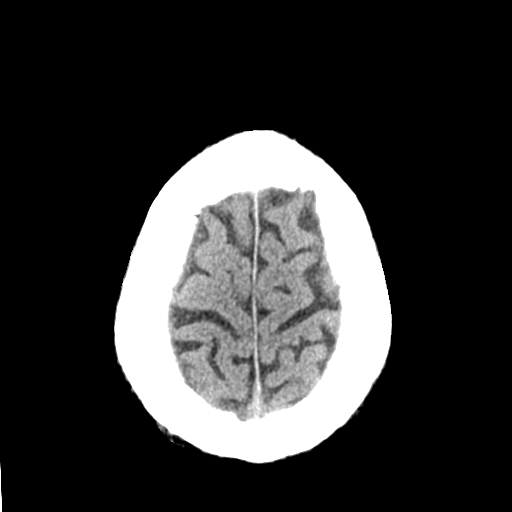
[im 28/31  brain]
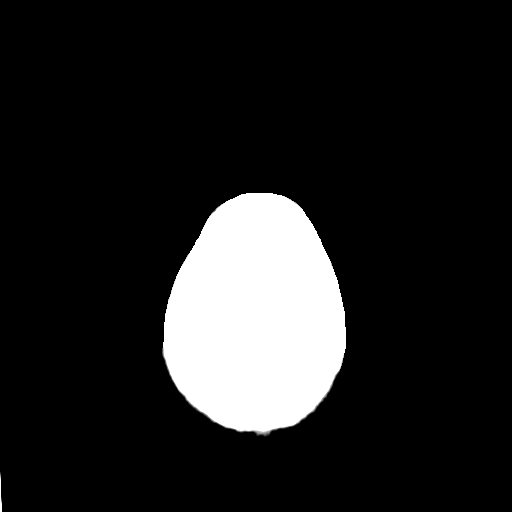
[im 28/31  bone]
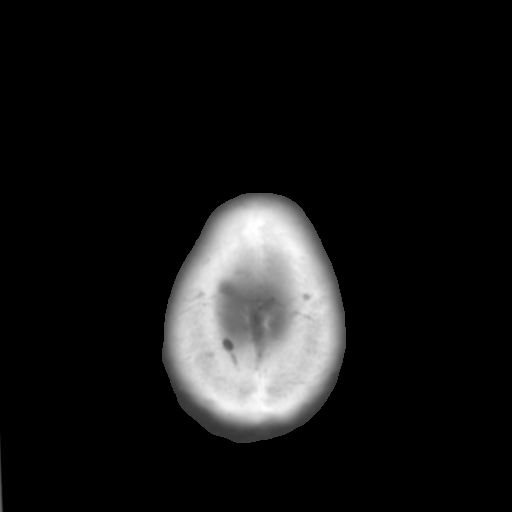

[Series 4: coronal soft tissue · coronal · 0.29mm/px · 3 of 73 slices shown]
[im 25/73  brain]
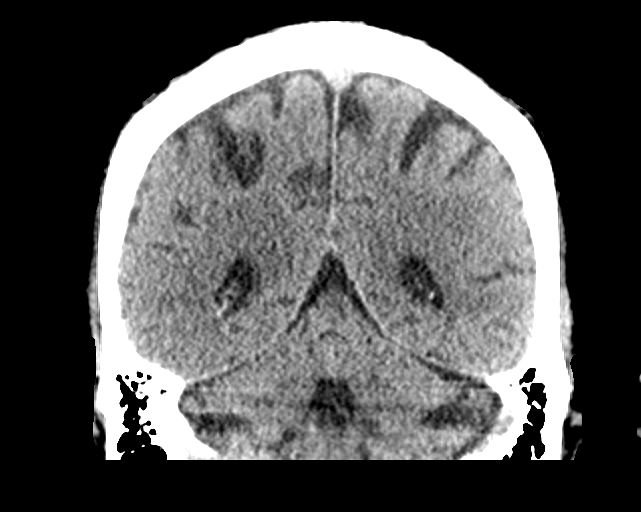
[im 33/73  brain]
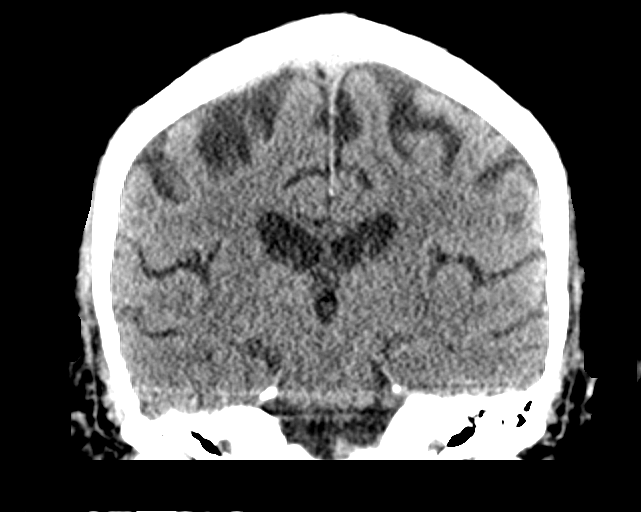
[im 41/73  brain]
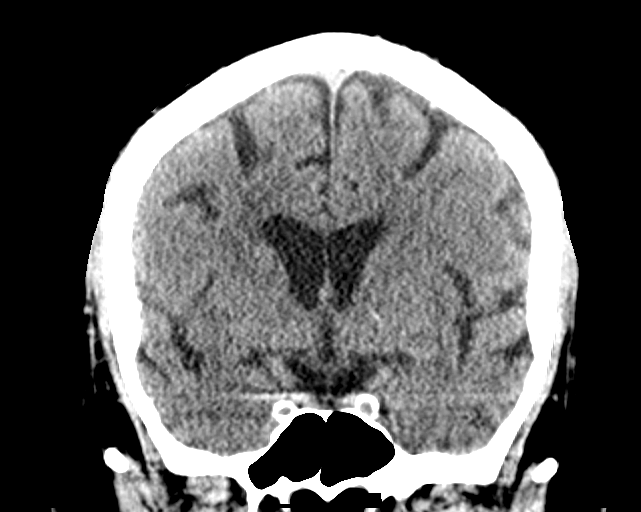

[Series 5: sagittal soft tissue · sagittal · 0.29mm/px · 3 of 58 slices shown]
[im 20/58  brain]
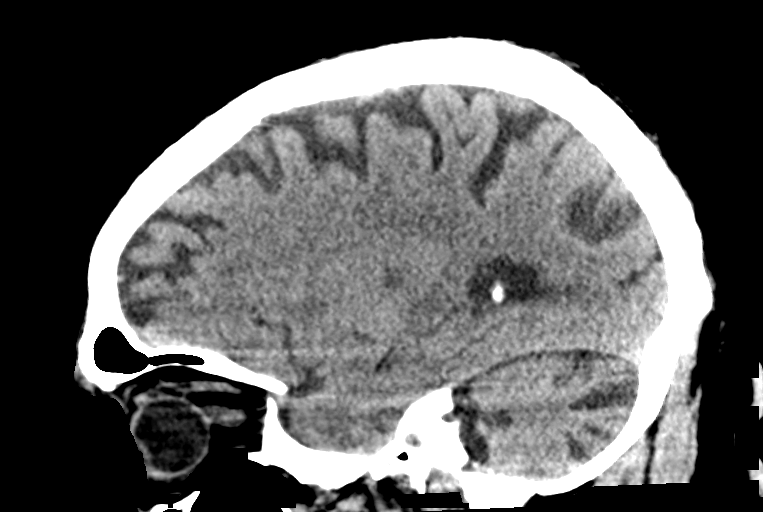
[im 29/58  brain]
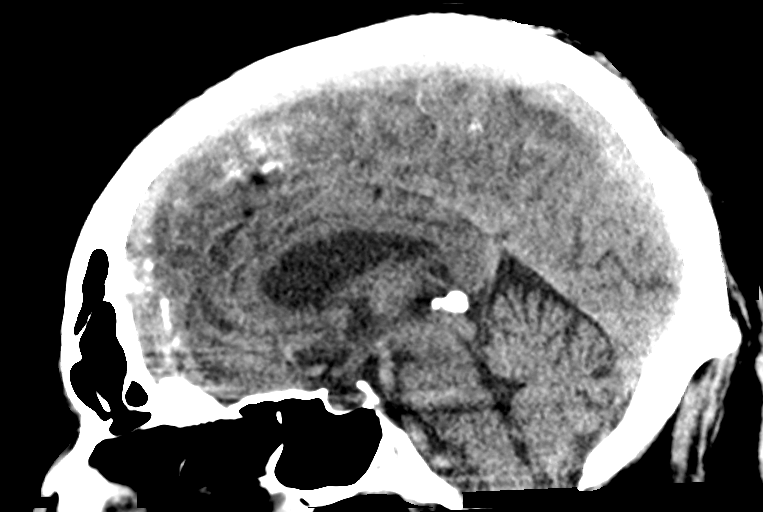
[im 39/58  brain]
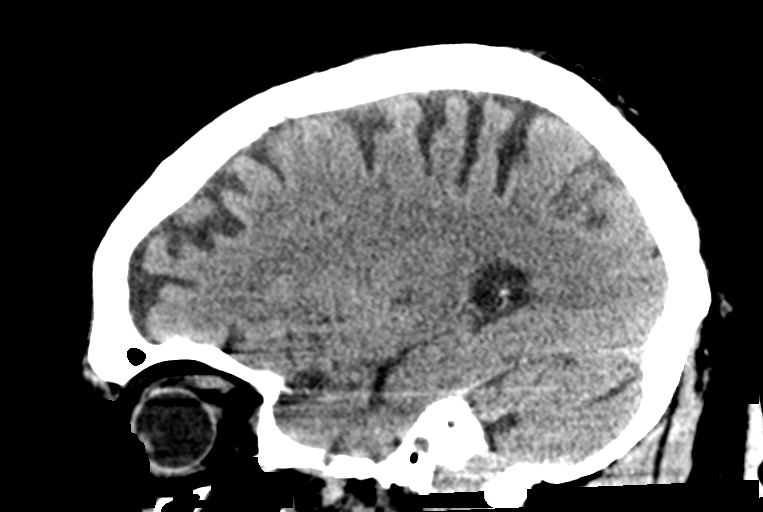

[15 of 47 positions shown; findings below may reference images not displayed]

FINDINGS: Brain: No evidence of acute infarction, hemorrhage, hydrocephalus,
extra-axial collection or mass lesion/mass effect.

Vascular: Atherosclerotic calcifications involving the large vessels
of the skull base. No unexpected hyperdense vessel.

Skull: Normal. Negative for fracture or focal lesion.

Sinuses/Orbits: No acute finding.

Other: Negative for scalp hematoma.
IMPRESSION: No acute intracranial findings.

## 2022-12-07 DIAGNOSIS — J9601 Acute respiratory failure with hypoxia: Secondary | ICD-10-CM | POA: Diagnosis not present

## 2022-12-07 DIAGNOSIS — G4733 Obstructive sleep apnea (adult) (pediatric): Secondary | ICD-10-CM | POA: Diagnosis not present

## 2022-12-16 ENCOUNTER — Other Ambulatory Visit: Payer: Self-pay | Admitting: Nurse Practitioner

## 2022-12-16 DIAGNOSIS — N401 Enlarged prostate with lower urinary tract symptoms: Secondary | ICD-10-CM

## 2022-12-16 DIAGNOSIS — F331 Major depressive disorder, recurrent, moderate: Secondary | ICD-10-CM

## 2022-12-16 NOTE — Telephone Encounter (Signed)
It ok to send

## 2023-01-07 DIAGNOSIS — J9601 Acute respiratory failure with hypoxia: Secondary | ICD-10-CM | POA: Diagnosis not present

## 2023-01-07 DIAGNOSIS — G4733 Obstructive sleep apnea (adult) (pediatric): Secondary | ICD-10-CM | POA: Diagnosis not present

## 2023-01-23 ENCOUNTER — Other Ambulatory Visit: Payer: Self-pay | Admitting: Nurse Practitioner

## 2023-01-23 DIAGNOSIS — I700 Atherosclerosis of aorta: Secondary | ICD-10-CM

## 2023-01-24 ENCOUNTER — Other Ambulatory Visit: Payer: Self-pay | Admitting: Nurse Practitioner

## 2023-01-24 DIAGNOSIS — I700 Atherosclerosis of aorta: Secondary | ICD-10-CM

## 2023-02-06 DIAGNOSIS — G4733 Obstructive sleep apnea (adult) (pediatric): Secondary | ICD-10-CM | POA: Diagnosis not present

## 2023-02-06 DIAGNOSIS — J9601 Acute respiratory failure with hypoxia: Secondary | ICD-10-CM | POA: Diagnosis not present

## 2023-02-20 ENCOUNTER — Encounter: Payer: Self-pay | Admitting: Physician Assistant

## 2023-02-20 ENCOUNTER — Ambulatory Visit (INDEPENDENT_AMBULATORY_CARE_PROVIDER_SITE_OTHER): Payer: Medicare HMO | Admitting: Physician Assistant

## 2023-02-20 VITALS — BP 130/70 | HR 79 | Temp 98.3°F | Resp 16 | Ht 69.0 in | Wt 291.4 lb

## 2023-02-20 DIAGNOSIS — J84112 Idiopathic pulmonary fibrosis: Secondary | ICD-10-CM | POA: Diagnosis not present

## 2023-02-20 DIAGNOSIS — Z6841 Body Mass Index (BMI) 40.0 and over, adult: Secondary | ICD-10-CM | POA: Diagnosis not present

## 2023-02-20 DIAGNOSIS — G4733 Obstructive sleep apnea (adult) (pediatric): Secondary | ICD-10-CM

## 2023-02-20 DIAGNOSIS — Z7189 Other specified counseling: Secondary | ICD-10-CM | POA: Diagnosis not present

## 2023-02-20 NOTE — Progress Notes (Signed)
[facility] [address] [place], [place] [address]  Pulmonary Sleep Medicine   Office Visit Note  Patient Name: Jimmy Norton. DOB: 04-15-1946 MRN [id number]  Date of Service: 02/20/2023  Complaints/HPI: pt is here for routine follow up. He is seeing the [place] now. He has had several tests done. He states [facility] but [facility] OSA now. 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 He uses it every night. He is benefiting from use. Denies Sob or dryness with machine. Does need to keep up with supply changes and cleaning.    95 percentile pressure 11.7    95th percentile leak 23.6    apnea index 0.5 /hr  apnea-hypopnea index  1.2 /hr    total days used  >4 hr 82 days  total days used <4 hr 5 days   Total compliance 97 percent      ROS  General: (-) fever, (-) chills, (-) night sweats, (-) weakness Skin: (-) rashes, (-) itching,. Eyes: (-) visual changes, (-) redness, (-) itching. Nose and Sinuses: (-) nasal stuffiness or itchiness, (-) postnasal drip, (-) nosebleeds, (-) sinus trouble. Mouth and Throat: (-) sore throat, (-) hoarseness. Neck: (-) swollen glands, (-) enlarged thyroid, (-) neck pain. Respiratory: - cough, (-) bloody sputum, - shortness of breath, - wheezing. Cardiovascular: - ankle swelling, (-) chest pain. Lymphatic: (-) lymph node enlargement. Neurologic: (-) numbness, (-) tingling. Psychiatric: (-) anxiety, (-) depression   Current Medication: Outpatient Encounter Medications as of 02/20/2023  Medication Sig   cholecalciferol (VITAMIN D) 400 UNITS TABS tablet Take 400 Units by mouth daily.   Cyanocobalamin (B-12) 2500 MCG TABS Take 2,500 mcg by mouth daily.   FLUoxetine (PROZAC) 20 MG capsule Take 1 capsule by mouth once daily   GLUCOSAMINE-CHONDROITIN PO Take 1 tablet by mouth 2 (two) times daily.   losartan (COZAAR) 25 MG tablet Take 1 tablet (25 mg total) by mouth  daily.   Misc. Devices MISC cpap   Multiple Vitamins-Minerals (MULTIVITAMIN WITH MINERALS) tablet Take 1 tablet by mouth daily. Reported on 09/11/2015   oxybutynin (DITROPAN-XL) 10 MG 24 hr tablet TAKE ONE TABLET BY MOUTH EVERY DAY FOR URINARY FREQUENCY   Probiotic Product (PROBIOTIC-10 PO) Take 1 capsule by mouth daily.   Pyridoxine HCl (B-6) 100 MG TABS Take 100 mg by mouth daily.   rosuvastatin (CRESTOR) 5 MG tablet Take 1 tablet by mouth once daily   terazosin (HYTRIN) 10 MG capsule Take 1 capsule by mouth at bedtime   No facility-administered encounter medications on file as of 02/20/2023.    Surgical History: Past Surgical History:  Procedure Laterality Date   COLONOSCOPY WITH PROPOFOL N/A 05/11/2021   Procedure: COLONOSCOPY WITH PROPOFOL;  Surgeon: Wyline Mood, MD;  Location: [facility] ENDOSCOPY;  Service: Gastroenterology;  Laterality: N/A;   HAND SURGERY Bilateral    HERNIA REPAIR     x 2   KNEE ARTHROPLASTY Left 11/22/2021   Procedure: COMPUTER ASSISTED TOTAL KNEE ARTHROPLASTY;  Surgeon: Donato Heinz, MD;  Location: [facility] ORS;  Service: Orthopedics;  Laterality: Left;   NOSE SURGERY     REPLACEMENT TOTAL KNEE Right    REPLACEMENT TOTAL KNEE Left 11/22/2021    Medical History: Past Medical History:  Diagnosis Date   Anxiety and depression    Arthritis    Complication of anesthesia    woke up during surgery   Frequency  Gallstones 03/05/2013   Headache    Hypertension    Kidney problem 11/2011   Mixed hyperlipidemia    OAB (overactive bladder)    Obstructive sleep apnea (adult) (pediatric)    Pneumonia     Family History: Family History  Problem Relation Age of Onset   COPD Mother    Heart disease Mother    Cancer Father        Brain tumor   Lung cancer Paternal Uncle    Cancer - Other Maternal Grandmother        Back   Kidney disease Neg Hx    Prostate cancer Neg Hx     Social History: Social History   Socioeconomic History   Marital status:  Married    Spouse name: Consuella Lose   Number of children: Not on file   Years of education: Not on file   Highest education level: Not on file  Occupational History   Not on file  Tobacco Use   Smoking status: Some Days    Types: Cigars   Smokeless tobacco: Current    Types: Chew   Tobacco comments:    smokes cigars onces in a while  Vaping Use   Vaping status: Never Used  Substance and Sexual Activity   Alcohol use: No    Alcohol/week: 0.0 standard drinks of alcohol   Drug use: No   Sexual activity: Not on file  Other Topics Concern   Not on file  Social History Narrative   Live with wife.   Social Determinants of Health   Financial Resource Strain: Low Risk  (06/11/2021)   Overall Financial Resource Strain (CARDIA)    Difficulty of Paying Living Expenses: Not hard at all  Food Insecurity: Not on file  Transportation Needs: Not on file  Physical Activity: Not on file  Stress: Not on file  Social Connections: Not on file  Intimate Partner Violence: Not on file    Vital Signs: Blood pressure 130/70, pulse 79, temperature 98.3 F (36.8 C), resp. rate 16, height 5\' 9"  (1.753 m), weight 291 lb 6.4 oz (132.2 kg), SpO2 95%.  Examination: General Appearance: The patient is well-developed, well-nourished, and in no distress. Skin: Gross inspection of skin unremarkable. Head: normocephalic, no gross deformities. Eyes: no gross deformities noted. ENT: ears appear grossly normal no exudates. Neck: Supple. No thyromegaly. No LAD. Respiratory: Lungs clear to auscultation bilaterally. Cardiovascular: Normal S1 and S2 without murmur or rub. Extremities: No cyanosis. pulses are equal. Neurologic: Alert and oriented. No involuntary movements.  LABS: No results found for this or any previous visit (from the past 2160 hour(s)).  Radiology: CT Chest Wo Contrast  Result Date: 10/06/2022 CLINICAL DATA:  Shortness of breath, restrictive lung disease. EXAM: CT CHEST WITHOUT CONTRAST  TECHNIQUE: Multidetector CT imaging of the chest was performed following the standard protocol without IV contrast. RADIATION DOSE REDUCTION: This exam was performed according to the departmental dose-optimization program which includes automated exposure control, adjustment of the mA and/or kV according to patient size and/or use of iterative reconstruction technique. COMPARISON:  None Available. FINDINGS: Cardiovascular: Atherosclerotic calcification of the aorta, aortic valve and coronary arteries. Enlarged pulmonic trunk and heart. No pericardial effusion. Mediastinum/Nodes: Mediastinal lymph nodes measure up to 10 mm in the low right paratracheal station. Hilar regions are difficult to evaluate without IV contrast. No axillary adenopathy. Esophagus is grossly unremarkable. Lungs/Pleura: Peripheral and basilar predominant subpleural reticulation with traction bronchiectasis/bronchiolectasis and some interstitial and subpleural ground-glass. Image quality in the lung  bases is degraded by respiratory motion. No pleural fluid. Airway is unremarkable. Upper Abdomen: Visualized portion of the liver is unremarkable. Stones in the gallbladder. Adrenal glands and visualized portion of the right kidney are unremarkable. Probable left renal cysts. No specific follow-up necessary. Visualized portions of the spleen, pancreas, stomach and bowel are grossly unremarkable. No upper abdominal adenopathy. Musculoskeletal: Degenerative changes in the spine. No worrisome lytic or sclerotic lesions. Old rib fractures. IMPRESSION: 1. Pulmonary parenchymal pattern of fibrosis may be due to usual interstitial pneumonitis or fibrotic nonspecific interstitial pneumonitis. Future evaluation utilizing a high-resolution chest CT protocol is recommended. 2. Borderline mediastinal adenopathy, likely reactive in the setting of interstitial lung disease. 3. Cholelithiasis. 4. Aortic atherosclerosis (ICD10-I70.0). Coronary artery calcification.  5. Enlarged pulmonic trunk, indicative of pulmonary arterial hypertension. Electronically Signed   By: Leanna Battles M.D.   On: 10/06/2022 11:18    No results found.  No results found.    Assessment and Plan: Patient Active Problem List   Diagnosis Date Noted   Unspecified astigmatism, bilateral 11/22/2021   Balanitis 11/22/2021   Total knee replacement status 11/22/2021   Numbness and tingling in right hand 09/25/2021   Hardening of the aorta (main artery of the heart) (HCC) 08/29/2021   Exposure to potentially hazardous substance 08/29/2021   Bilateral sensorineural hearing loss 08/29/2021   Age-related nuclear cataract, bilateral 08/29/2021   Noncompliance with medication regimen 12/15/2020   Syncope 04/15/2020   Abrasion of left forearm 04/15/2020   Abrasion of right lower leg 04/15/2020   Laceration of left forearm without complication 12/26/2019   Encounter for general adult medical examination with abnormal findings 08/27/2019   Left knee pain 08/27/2019   Cervical stenosis of spinal canal 11/06/2018   Screening for prostate cancer 07/30/2018   Dysuria 07/30/2018   Arthritis of carpometacarpal (CMC) joint of right thumb 07/02/2018   Primary generalized (osteo)arthritis 06/10/2018   Cervical disc disease with myelopathy 06/10/2018   Anxiety 06/08/2018   Depression 06/08/2018   BPH (benign prostatic hyperplasia) 06/08/2018   Neuropathy 06/08/2018   Morbid obesity with body mass index (BMI) of 40.0 to 44.9 in adult (HCC) 11/30/2017   Tick bite 11/27/2017   Plantar fasciitis 11/27/2017   Primary osteoarthritis of right elbow 11/27/2017   OSA on CPAP 08/24/2017   Mixed hyperlipidemia 07/28/2017   Allergic rhinitis due to pollen 07/28/2017   Hypoxemia 07/28/2017   Calculus of kidney 07/28/2017   Chest pain, unspecified 07/28/2017   Major depressive disorder, recurrent, moderate (HCC) 07/28/2017   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 Obstructive sleep apnea [G47.33] Continue excellent compliance  2. CPAP use counseling CPAP couseling-Discussed importance of adequate CPAP use as well as proper care and cleaning techniques of machine and all supplies.  3. UIP (usual interstitial pneumonitis) (HCC) [facility]  4. Morbid obesity with BMI of 40.0-44.9, adult (HCC) Obesity Counseling: Had a lengthy discussion regarding patients BMI and weight issues. Patient was instructed on portion control as well as increased activity. Also  discussed caloric restrictions with trying to maintain intake less than 2000 Kcal. Discussions were made in accordance with the 5As of weight management. Simple actions such as not eating late and if able to, taking a walk is suggested.    General Counseling: I have discussed the findings of the evaluation and examination with Alin.  I have also discussed any further diagnostic evaluation thatmay be needed or ordered today. Sohrab verbalizes understanding of the findings of todays visit. We also reviewed his medications today and discussed drug interactions and side effects including but not limited excessive drowsiness and altered mental states. We also discussed that there is always a risk not just to him but also people around him. he has been encouraged to call the office with any questions or concerns that should arise related to todays visit.  No orders of the defined types were placed in this encounter.    Time spent: 30  I  have personally obtained a history, examined the patient, evaluated laboratory and imaging results, formulated the assessment and plan and placed orders. This patient was seen by Lynn Ito, PA-C in collaboration with Dr. Freda Munro as a part of collaborative care agreement.     Yevonne Pax, MD [facility] Pulmonary and Critical Care Sleep medicine

## 2023-03-09 DIAGNOSIS — J9601 Acute respiratory failure with hypoxia: Secondary | ICD-10-CM | POA: Diagnosis not present

## 2023-03-09 DIAGNOSIS — G4733 Obstructive sleep apnea (adult) (pediatric): Secondary | ICD-10-CM | POA: Diagnosis not present

## 2023-03-31 ENCOUNTER — Telehealth: Payer: Self-pay | Admitting: Internal Medicine

## 2023-03-31 NOTE — Telephone Encounter (Signed)
Lvm to move 06/28/23 appointment-Toni

## 2023-04-09 DIAGNOSIS — J9601 Acute respiratory failure with hypoxia: Secondary | ICD-10-CM | POA: Diagnosis not present

## 2023-04-09 DIAGNOSIS — G4733 Obstructive sleep apnea (adult) (pediatric): Secondary | ICD-10-CM | POA: Diagnosis not present

## 2023-05-09 DIAGNOSIS — J9601 Acute respiratory failure with hypoxia: Secondary | ICD-10-CM | POA: Diagnosis not present

## 2023-05-09 DIAGNOSIS — G4733 Obstructive sleep apnea (adult) (pediatric): Secondary | ICD-10-CM | POA: Diagnosis not present

## 2023-06-09 DIAGNOSIS — G4733 Obstructive sleep apnea (adult) (pediatric): Secondary | ICD-10-CM | POA: Diagnosis not present

## 2023-06-09 DIAGNOSIS — J9601 Acute respiratory failure with hypoxia: Secondary | ICD-10-CM | POA: Diagnosis not present

## 2023-06-28 ENCOUNTER — Ambulatory Visit: Payer: Medicare HMO

## 2023-07-09 DIAGNOSIS — G4733 Obstructive sleep apnea (adult) (pediatric): Secondary | ICD-10-CM | POA: Diagnosis not present

## 2023-07-09 DIAGNOSIS — J9601 Acute respiratory failure with hypoxia: Secondary | ICD-10-CM | POA: Diagnosis not present

## 2023-09-29 ENCOUNTER — Ambulatory Visit: Payer: Medicare Other | Admitting: Nurse Practitioner

## 2023-10-16 ENCOUNTER — Ambulatory Visit: Payer: Medicare Other | Admitting: Physician Assistant

## 2023-10-19 ENCOUNTER — Encounter: Payer: Self-pay | Admitting: Physician Assistant

## 2023-10-19 ENCOUNTER — Ambulatory Visit: Admitting: Physician Assistant

## 2023-10-19 VITALS — BP 118/68 | HR 66 | Temp 97.8°F | Resp 16 | Ht 69.0 in | Wt 294.0 lb

## 2023-10-19 DIAGNOSIS — G4733 Obstructive sleep apnea (adult) (pediatric): Secondary | ICD-10-CM | POA: Diagnosis not present

## 2023-10-19 DIAGNOSIS — Z6841 Body Mass Index (BMI) 40.0 and over, adult: Secondary | ICD-10-CM

## 2023-10-19 DIAGNOSIS — Z7189 Other specified counseling: Secondary | ICD-10-CM | POA: Diagnosis not present

## 2023-10-19 DIAGNOSIS — J84112 Idiopathic pulmonary fibrosis: Secondary | ICD-10-CM | POA: Diagnosis not present

## 2023-10-19 DIAGNOSIS — Y92009 Unspecified place in unspecified non-institutional (private) residence as the place of occurrence of the external cause: Secondary | ICD-10-CM

## 2023-10-19 NOTE — Progress Notes (Signed)
 [facility] [address] [place], [place] [address]  Pulmonary Sleep Medicine   Office Visit Note  Patient Name: Jimmy Norton. DOB: [birth year] MRN [id number]  Date of Service: 10/19/2023  Complaints/HPI: Pt is here for routine pulmonary follow up. Doing well with CPA He is using nightly and benefiting from use. Does have headaches occasionally since neck fusion in Dec. Was doing PT, but done with this now. Does state he fell out bed on Monday and scraped his knee and shoulder. May have hit head but denies any problems. He declines CT scan at this time and states he is aware of possible consequences of hitting head. States the [place] is following with lung testing as well as heart. States he has had more imaging and monitoring. He was exposed to agent orange on past tours. Has SOB with exertion and takes breaks as needed. He states they have taken over this evaluation. 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  CPAP settings: 09/19/23-10/18/23 Use 30/30 days >4 hours 93% Avg use 6 hours Settings APAP 5-15cm H2O Leak 95th 29.9 AHI 1.1  ROS  General: (-) fever, (-) chills, (-) night sweats, (-) weakness Skin: (-) rashes, (-) itching,. Eyes: (-) visual changes, (-) redness, (-) itching. Nose and Sinuses: (-) nasal stuffiness or itchiness, (-) postnasal drip, (-) nosebleeds, (-) sinus trouble. Mouth and Throat: (-) sore throat, (-) hoarseness. Neck: (-) swollen glands, (-) enlarged thyroid, (-) neck pain. Respiratory: - cough, (-) bloody sputum, - shortness of breath, - wheezing. Cardiovascular: - ankle swelling, (-) chest pain. Lymphatic: (-) lymph node enlargement. Neurologic: (-) numbness, (-) tingling. Psychiatric: (-) anxiety, (-) depression   Current Medication: Outpatient Encounter Medications as of 10/19/2023  Medication Sig   cholecalciferol  (VITAMIN D) 400 UNITS TABS tablet Take 400 Units by mouth daily.   Cyanocobalamin (B-12) 2500 MCG TABS Take 2,500 mcg by mouth daily.   FLUoxetine (PROZAC) 20 MG capsule Take 1 capsule by mouth once daily   GLUCOSAMINE-CHONDROITIN PO Take 1 tablet by mouth 2 (two) times daily.   losartan (COZAAR) 25 MG tablet Take 1 tablet (25 mg total) by mouth daily.   Misc. Devices MISC cpap   Multiple Vitamins-Minerals (MULTIVITAMIN WITH MINERALS) tablet Take 1 tablet by mouth daily. Reported on 09/11/2015   oxybutynin (DITROPAN-XL) 10 MG 24 hr tablet TAKE ONE TABLET BY MOUTH EVERY DAY FOR URINARY FREQUENCY   Probiotic Product (PROBIOTIC-10 PO) Take 1 capsule by mouth daily.   Pyridoxine HCl (B-6) 100 MG TABS Take 100 mg by mouth daily.   rosuvastatin (CRESTOR) 5 MG tablet Take 1 tablet by mouth once daily   terazosin (HYTRIN) 10 MG capsule Take 1 capsule by mouth at bedtime   No facility-administered encounter medications on file as of 10/19/2023.    Surgical History: Past Surgical History:  Procedure Laterality Date   COLONOSCOPY WITH PROPOFOL N/A 05/11/2021   Procedure: COLONOSCOPY WITH PROPOFOL;  Surgeon: Wyline Mood, MD;  Location: [facility] ENDOSCOPY;  Service: Gastroenterology;  Laterality: N/A;   HAND SURGERY Bilateral    HERNIA REPAIR     x 2   KNEE ARTHROPLASTY Left 11/22/2021   Procedure: COMPUTER ASSISTED TOTAL KNEE ARTHROPLASTY;  Surgeon: Donato Heinz, MD;  Location: [facility] ORS;  Service: Orthopedics;  Laterality: Left;   NOSE SURGERY     REPLACEMENT TOTAL KNEE Right  REPLACEMENT TOTAL KNEE Left 11/22/2021    Medical History: Past Medical History:  Diagnosis Date   Anxiety and depression    Arthritis    Complication of anesthesia    woke up during surgery   Frequency    Gallstones 03/05/2013   Headache    Hypertension    Kidney problem 11/2011   Mixed hyperlipidemia    OAB (overactive bladder)    Obstructive sleep apnea (adult) (pediatric)    Pneumonia     Family  History: Family History  Problem Relation Age of Onset   COPD Mother    Heart disease Mother    Cancer Father        Brain tumor   Lung cancer Paternal Uncle    Cancer - Other Maternal Grandmother        Back   Kidney disease Neg Hx    Prostate cancer Neg Hx     Social History: Social History   Socioeconomic History   Marital status: Married    Spouse name: Consuella Lose   Number of children: Not on file   Years of education: Not on file   Highest education level: Not on file  Occupational History   Not on file  Tobacco Use   Smoking status: Some Days    Types: Cigars   Smokeless tobacco: Current    Types: Chew   Tobacco comments:    smokes cigars onces in a while  Vaping Use   Vaping status: Never Used  Substance and Sexual Activity   Alcohol use: No    Alcohol/week: 0.0 standard drinks of alcohol   Drug use: No   Sexual activity: Not on file  Other Topics Concern   Not on file  Social History Narrative   Live with wife.   Social Drivers of Corporate investment banker Strain: Low Risk  (06/11/2021)   Overall Financial Resource Strain (CARDIA)    Difficulty of Paying Living Expenses: Not hard at all  Food Insecurity: Not on file  Transportation Needs: Not on file  Physical Activity: Not on file  Stress: Not on file  Social Connections: Not on file  Intimate Partner Violence: Not on file    Vital Signs: Blood pressure 118/68, pulse 66, temperature 97.8 F (36.6 C), resp. rate 16, height 5\' 9"  (1.753 m), weight 294 lb (133.4 kg), SpO2 94%.  Examination: General Appearance: The patient is well-developed, well-nourished, and in no distress. Skin: Gross inspection of skin unremarkable. Head: normocephalic, no gross deformities. Eyes: no gross deformities noted. ENT: ears appear grossly normal no exudates. Neck: Supple. No thyromegaly. No LAD. Respiratory: No rhonchi. Cardiovascular: Normal S1 and S2 without murmur or rub. Extremities: No cyanosis. pulses are  equal. Neurologic: Alert and oriented. No involuntary movements.  LABS: No results found for this or any previous visit (from the past 2160 hours).  Radiology: CT Chest Wo Contrast Result Date: 10/06/2022 CLINICAL DATA:  Shortness of breath, restrictive lung disease. EXAM: CT CHEST WITHOUT CONTRAST TECHNIQUE: Multidetector CT imaging of the chest was performed following the standard protocol without IV contrast. RADIATION DOSE REDUCTION: This exam was performed according to the departmental dose-optimization program which includes automated exposure control, adjustment of the mA and/or kV according to patient size and/or use of iterative reconstruction technique. COMPARISON:  None Available. FINDINGS: Cardiovascular: Atherosclerotic calcification of the aorta, aortic valve and coronary arteries. Enlarged pulmonic trunk and heart. No pericardial effusion. Mediastinum/Nodes: Mediastinal lymph nodes measure up to 10 mm in the low right paratracheal  station. Hilar regions are difficult to evaluate without IV contrast. No axillary adenopathy. Esophagus is grossly unremarkable. Lungs/Pleura: Peripheral and basilar predominant subpleural reticulation with traction bronchiectasis/bronchiolectasis and some interstitial and subpleural ground-glass. Image quality in the lung bases is degraded by respiratory motion. No pleural fluid. Airway is unremarkable. Upper Abdomen: Visualized portion of the liver is unremarkable. Stones in the gallbladder. Adrenal glands and visualized portion of the right kidney are unremarkable. Probable left renal cysts. No specific follow-up necessary. Visualized portions of the spleen, pancreas, stomach and bowel are grossly unremarkable. No upper abdominal adenopathy. Musculoskeletal: Degenerative changes in the spine. No worrisome lytic or sclerotic lesions. Old rib fractures. IMPRESSION: 1. Pulmonary parenchymal pattern of fibrosis may be due to usual interstitial pneumonitis or fibrotic  nonspecific interstitial pneumonitis. Future evaluation utilizing a high-resolution chest CT protocol is recommended. 2. Borderline mediastinal adenopathy, likely reactive in the setting of interstitial lung disease. 3. Cholelithiasis. 4. Aortic atherosclerosis (ICD10-I70.0). Coronary artery calcification. 5. Enlarged pulmonic trunk, indicative of pulmonary arterial hypertension. Electronically Signed   By: Leanna Battles M.D.   On: 10/06/2022 11:18    No results found.  No results found.    Assessment and Plan: Patient Active Problem List   Diagnosis Date Noted   Unspecified astigmatism, bilateral 11/22/2021   Balanitis 11/22/2021   Total knee replacement status 11/22/2021   Numbness and tingling in right hand 09/25/2021   Hardening of the aorta (main artery of the heart) (HCC) 08/29/2021   Exposure to potentially hazardous substance 08/29/2021   Bilateral sensorineural hearing loss 08/29/2021   Age-related nuclear cataract, bilateral 08/29/2021   Noncompliance with medication regimen 12/15/2020   Syncope 04/15/2020   Abrasion of left forearm 04/15/2020   Abrasion of right lower leg 04/15/2020   Laceration of left forearm without complication 12/26/2019   Encounter for general adult medical examination with abnormal findings 08/27/2019   Left knee pain 08/27/2019   Cervical stenosis of spinal canal 11/06/2018   Screening for prostate cancer 07/30/2018   Dysuria 07/30/2018   Arthritis of carpometacarpal (CMC) joint of right thumb 07/02/2018   Primary generalized (osteo)arthritis 06/10/2018   Cervical disc disease with myelopathy 06/10/2018   Anxiety 06/08/2018   Depression 06/08/2018   BPH (benign prostatic hyperplasia) 06/08/2018   Neuropathy 06/08/2018   Morbid obesity with body mass index (BMI) of 40.0 to 44.9 in adult (HCC) 11/30/2017   Tick bite 11/27/2017   Plantar fasciitis 11/27/2017   Primary osteoarthritis of right elbow 11/27/2017   OSA on CPAP 08/24/2017   Mixed  hyperlipidemia 07/28/2017   Allergic rhinitis due to pollen 07/28/2017   Hypoxemia 07/28/2017   Calculus of kidney 07/28/2017   Chest pain, unspecified 07/28/2017   Major depressive disorder, recurrent, moderate (HCC) 07/28/2017   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 OSA (obstructive sleep apnea) (Primary) Continue excellent compliance  2. CPAP use counseling CPAP couseling-Discussed importance of adequate CPAP use as well as proper care and cleaning techniques of machine and all supplies.  3. UIP (usual interstitial pneumonitis) (  HCC) [facility]  4. Morbid obesity with BMI of 40.0-44.9, adult (HCC) Obesity Counseling: Had a lengthy discussion regarding patients BMI and weight issues. Patient was instructed on portion control as well as increased activity. Also discussed caloric restrictions with trying to maintain intake less than 2000 Kcal. Discussions were made in accordance with the 5As of weight management. Simple actions such as not eating late and if able to, taking a walk is suggested.  5. Fall at home, initial encounter Advised to have CT head for further evaluation, but pt declines and denies any problems since fall out of bed. Call or go to ED if anything changes.    General Counseling: I have discussed the findings of the evaluation and examination with Daud.  I have also discussed any further diagnostic evaluation thatmay be needed or ordered today. Norah verbalizes  understanding of the findings of todays visit. We also reviewed his medications today and discussed drug interactions and side effects including but not limited excessive drowsiness and altered mental states. We also discussed that there is always a risk not just to him but also people around him. he has been encouraged to call the office with any questions or concerns that should arise related to todays visit.  No orders of the defined types were placed in this encounter.    Time spent: 30  I have personally obtained a history, examined the patient, evaluated laboratory and imaging results, formulated the assessment and plan and placed orders. This patient was seen by Lynn Ito, PA-C in collaboration with Dr. Freda Munro as a part of collaborative care agreement.     Yevonne Pax, MD [facility] Pulmonary and Critical Care Sleep medicine

## 2023-11-09 ENCOUNTER — Ambulatory Visit: Admitting: Physician Assistant

## 2023-11-09 ENCOUNTER — Encounter: Payer: Self-pay | Admitting: Physician Assistant

## 2023-11-09 VITALS — BP 126/62 | HR 75 | Temp 98.3°F | Resp 16 | Ht 69.0 in | Wt 290.0 lb

## 2023-11-09 DIAGNOSIS — J84112 Idiopathic pulmonary fibrosis: Secondary | ICD-10-CM

## 2023-11-09 DIAGNOSIS — Z7189 Other specified counseling: Secondary | ICD-10-CM | POA: Diagnosis not present

## 2023-11-09 DIAGNOSIS — G4733 Obstructive sleep apnea (adult) (pediatric): Secondary | ICD-10-CM | POA: Diagnosis not present

## 2023-11-09 NOTE — Progress Notes (Signed)
 [facility] [address] [place], [place] [address]  Pulmonary Sleep Medicine   Office Visit Note  Patient Name: Jimmy Norton. DOB: 11/22/1945 MRN [id number]  Date of Service: 11/09/2023  Complaints/HPI: Pt is here for pulmonary follow up at request of the [place] for further information regarding his nocturnal oxygen with cpap. He had a walk and PFT done at the [place]. Did not desat. Wearing 2L oxygen at night currently with cpap and has been on this for years. States the [place] needs the CPAP titration documenting oxygen needs for their coordinator to take over this prescription. [facility] and has been told they need to take over all of his care including his OSA on CPAP now with their own evaluations. States he has a sleep appt  May 1 with them.   ROS  General: (-) fever, (-) chills, (-) night sweats, (-) weakness Skin: (-) rashes, (-) itching,. Eyes: (-) visual changes, (-) redness, (-) itching. Nose and Sinuses: (-) nasal stuffiness or itchiness, (-) postnasal drip, (-) nosebleeds, (-) sinus trouble. Mouth and Throat: (-) sore throat, (-) hoarseness. Neck: (-) swollen glands, (-) enlarged thyroid, (-) neck pain. Respiratory: - cough, (-) bloody sputum, - shortness of breath, - wheezing. Cardiovascular: - ankle swelling, (-) chest pain. Lymphatic: (-) lymph node enlargement. Neurologic: (-) numbness, (-) tingling. Psychiatric: (-) anxiety, (-) depression   Current Medication: Outpatient Encounter Medications as of 11/09/2023  Medication Sig   cholecalciferol (VITAMIN D) 400 UNITS TABS tablet Take 400 Units by mouth daily.   Cyanocobalamin (B-12) 2500 MCG TABS Take 2,500 mcg by mouth daily.   FLUoxetine (PROZAC) 20 MG capsule Take 1 capsule by mouth once daily   GLUCOSAMINE-CHONDROITIN PO Take 1 tablet by mouth 2 (two) times daily.   losartan (COZAAR) 25 MG tablet Take 1 tablet (25 mg total) by mouth daily.   Misc. Devices MISC cpap    Multiple Vitamins-Minerals (MULTIVITAMIN WITH MINERALS) tablet Take 1 tablet by mouth daily. Reported on 09/11/2015   oxybutynin (DITROPAN-XL) 10 MG 24 hr tablet TAKE ONE TABLET BY MOUTH EVERY DAY FOR URINARY FREQUENCY   Probiotic Product (PROBIOTIC-10 PO) Take 1 capsule by mouth daily.   Pyridoxine HCl (B-6) 100 MG TABS Take 100 mg by mouth daily.   rosuvastatin (CRESTOR) 5 MG tablet Take 1 tablet by mouth once daily   terazosin (HYTRIN) 10 MG capsule Take 1 capsule by mouth at bedtime   No facility-administered encounter medications on file as of 11/09/2023.    Surgical History: Past Surgical History:  Procedure Laterality Date   COLONOSCOPY WITH PROPOFOL N/A 05/11/2021   Procedure: COLONOSCOPY WITH PROPOFOL;  Surgeon: Wyline Mood, MD;  Location: [facility] ENDOSCOPY;  Service: Gastroenterology;  Laterality: N/A;   HAND SURGERY Bilateral    HERNIA REPAIR     x 2   KNEE ARTHROPLASTY Left 11/22/2021   Procedure: COMPUTER ASSISTED TOTAL KNEE ARTHROPLASTY;  Surgeon: Donato Heinz, MD;  Location: [facility] ORS;  Service: Orthopedics;  Laterality: Left;   NOSE SURGERY     REPLACEMENT TOTAL KNEE Right    REPLACEMENT TOTAL KNEE Left 11/22/2021    Medical History: Past Medical History:  Diagnosis Date   Anxiety and depression    Arthritis    Complication of anesthesia    woke up during surgery   Frequency    Gallstones 03/05/2013   Headache    Hypertension    Kidney problem 11/2011   Mixed hyperlipidemia  OAB (overactive bladder)    Obstructive sleep apnea (adult) (pediatric)    Pneumonia     Family History: Family History  Problem Relation Age of Onset   COPD Mother    Heart disease Mother    Cancer Father        Brain tumor   Lung cancer Paternal Uncle    Cancer - Other Maternal Grandmother        Back   Kidney disease Neg Hx    Prostate cancer Neg Hx     Social History: Social History   Socioeconomic History   Marital status: Married    Spouse name: Lewie Rector   Number  of children: Not on file   Years of education: Not on file   Highest education level: Not on file  Occupational History   Not on file  Tobacco Use   Smoking status: Some Days    Types: Cigars   Smokeless tobacco: Current    Types: Chew   Tobacco comments:    smokes cigars onces in a while  Vaping Use   Vaping status: Never Used  Substance and Sexual Activity   Alcohol use: No    Alcohol/week: 0.0 standard drinks of alcohol   Drug use: No   Sexual activity: Not on file  Other Topics Concern   Not on file  Social History Narrative   Live with wife.   Social Drivers of Corporate investment banker Strain: Low Risk  (06/11/2021)   Overall Financial Resource Strain (CARDIA)    Difficulty of Paying Living Expenses: Not hard at all  Food Insecurity: Not on file  Transportation Needs: Not on file  Physical Activity: Not on file  Stress: Not on file  Social Connections: Not on file  Intimate Partner Violence: Not on file    Vital Signs: Blood pressure 126/62, pulse 75, temperature 98.3 F (36.8 C), resp. rate 16, height 5\' 9"  (1.753 m), weight 290 lb (131.5 kg), SpO2 96%.  Examination: General Appearance: The patient is well-developed, well-nourished, and in no distress. Skin: Gross inspection of skin unremarkable. Head: normocephalic, no gross deformities. Eyes: no gross deformities noted. ENT: ears appear grossly normal no exudates. Neck: Supple. No thyromegaly. No LAD. Respiratory: No rhonchi. Cardiovascular: Normal S1 and S2 without murmur or rub. Extremities: No cyanosis. pulses are equal. Neurologic: Alert and oriented. No involuntary movements.  LABS: No results found for this or any previous visit (from the past 2160 hours).  Radiology: CT Chest Wo Contrast Result Date: 10/06/2022 CLINICAL DATA:  Shortness of breath, restrictive lung disease. EXAM: CT CHEST WITHOUT CONTRAST TECHNIQUE: Multidetector CT imaging of the chest was performed following the standard  protocol without IV contrast. RADIATION DOSE REDUCTION: This exam was performed according to the departmental dose-optimization program which includes automated exposure control, adjustment of the mA and/or kV according to patient size and/or use of iterative reconstruction technique. COMPARISON:  None Available. FINDINGS: Cardiovascular: Atherosclerotic calcification of the aorta, aortic valve and coronary arteries. Enlarged pulmonic trunk and heart. No pericardial effusion. Mediastinum/Nodes: Mediastinal lymph nodes measure up to 10 mm in the low right paratracheal station. Hilar regions are difficult to evaluate without IV contrast. No axillary adenopathy. Esophagus is grossly unremarkable. Lungs/Pleura: Peripheral and basilar predominant subpleural reticulation with traction bronchiectasis/bronchiolectasis and some interstitial and subpleural ground-glass. Image quality in the lung bases is degraded by respiratory motion. No pleural fluid. Airway is unremarkable. Upper Abdomen: Visualized portion of the liver is unremarkable. Stones in the gallbladder. Adrenal glands and  visualized portion of the right kidney are unremarkable. Probable left renal cysts. No specific follow-up necessary. Visualized portions of the spleen, pancreas, stomach and bowel are grossly unremarkable. No upper abdominal adenopathy. Musculoskeletal: Degenerative changes in the spine. No worrisome lytic or sclerotic lesions. Old rib fractures. IMPRESSION: 1. Pulmonary parenchymal pattern of fibrosis may be due to usual interstitial pneumonitis or fibrotic nonspecific interstitial pneumonitis. Future evaluation utilizing a high-resolution chest CT protocol is recommended. 2. Borderline mediastinal adenopathy, likely reactive in the setting of interstitial lung disease. 3. Cholelithiasis. 4. Aortic atherosclerosis (ICD10-I70.0). Coronary artery calcification. 5. Enlarged pulmonic trunk, indicative of pulmonary arterial hypertension.  Electronically Signed   By: Shearon Denis M.D.   On: 10/06/2022 11:18    No results found.  No results found.    Assessment and Plan: Patient Active Problem List   Diagnosis Date Noted   Unspecified astigmatism, bilateral 11/22/2021   Balanitis 11/22/2021   Total knee replacement status 11/22/2021   Numbness and tingling in right hand 09/25/2021   Hardening of the aorta (main artery of the heart) (HCC) 08/29/2021   Exposure to potentially hazardous substance 08/29/2021   Bilateral sensorineural hearing loss 08/29/2021   Age-related nuclear cataract, bilateral 08/29/2021   Noncompliance with medication regimen 12/15/2020   Syncope 04/15/2020   Abrasion of left forearm 04/15/2020   Abrasion of right lower leg 04/15/2020   Laceration of left forearm without complication 12/26/2019   Encounter for general adult medical examination with abnormal findings 08/27/2019   Left knee pain 08/27/2019   Cervical stenosis of spinal canal 11/06/2018   Screening for prostate cancer 07/30/2018   Dysuria 07/30/2018   Arthritis of carpometacarpal (CMC) joint of right thumb 07/02/2018   Primary generalized (osteo)arthritis 06/10/2018   Cervical disc disease with myelopathy 06/10/2018   Anxiety 06/08/2018   Depression 06/08/2018   BPH (benign prostatic hyperplasia) 06/08/2018   Neuropathy 06/08/2018   Morbid obesity with body mass index (BMI) of 40.0 to 44.9 in adult (HCC) 11/30/2017   Tick bite 11/27/2017   Plantar fasciitis 11/27/2017   Primary osteoarthritis of right elbow 11/27/2017   OSA on CPAP 08/24/2017   Mixed hyperlipidemia 07/28/2017   Allergic rhinitis due to pollen 07/28/2017   Hypoxemia 07/28/2017   Calculus of kidney 07/28/2017   Chest pain, unspecified 07/28/2017   Major depressive disorder, recurrent, moderate (HCC) 07/28/2017   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 OSA (obstructive sleep apnea) (Primary) Continue nightly use, will provide documentation regarding previous sleep studies/nocturnal oxygen needs to the [place] as requested  2. CPAP use counseling CPAP couseling-Discussed importance of adequate CPAP use as well as proper care and cleaning techniques of machine and all supplies.  3. UIP (usual interstitial pneumonitis) (HCC) [facility]   General Counseling: I have discussed the findings of the evaluation and examination with Bernard.  I have also discussed any further diagnostic evaluation thatmay be needed or ordered today. Trust verbalizes understanding of the findings of todays visit. We also reviewed his medications today and  discussed drug interactions and side effects including but not limited excessive drowsiness and altered mental states. We also discussed that there is always a risk not just to him but also people around him. he has been encouraged to call the office with any questions or concerns that should arise related to todays visit.  No orders of the defined types were placed in this encounter.    Time spent: 30  I have personally obtained a history, examined the patient, evaluated laboratory and imaging results, formulated the assessment and plan and placed orders. This patient was seen by Taylor Favia, PA-C in collaboration with Dr. Cam Cava as a part of collaborative care agreement.     Cordie Deters, MD [facility] Pulmonary and Critical Care Sleep medicine

## 2023-11-13 ENCOUNTER — Telehealth: Payer: Self-pay | Admitting: Physician Assistant

## 2023-11-13 NOTE — Telephone Encounter (Signed)
 Lvm with [facility] regarding [facility]-Toni

## 2023-11-14 ENCOUNTER — Telehealth: Payer: Self-pay | Admitting: Internal Medicine

## 2023-11-14 NOTE — Telephone Encounter (Signed)
 192 pages of MR faxed to Cat with [facility]; [phone]

## 2023-11-17 ENCOUNTER — Telehealth: Payer: Self-pay | Admitting: Internal Medicine

## 2023-11-17 NOTE — Telephone Encounter (Signed)
 SS, titrations, demographics & [facility] form faxed to Cat w/ [facility]; [phone]

## 2024-10-17 ENCOUNTER — Ambulatory Visit: Admitting: Physician Assistant
